# Patient Record
Sex: Female | Born: 1990
Health system: Southern US, Community
[De-identification: ages and names within clinical notes are randomized; demographics above are authoritative.]

## PROBLEM LIST (undated history)

## (undated) ENCOUNTER — Inpatient Hospital Stay (HOSPITAL_COMMUNITY): Payer: Self-pay

## (undated) DIAGNOSIS — I4581 Long QT syndrome: Secondary | ICD-10-CM

## (undated) DIAGNOSIS — Z95810 Presence of automatic (implantable) cardiac defibrillator: Secondary | ICD-10-CM

## (undated) DIAGNOSIS — G43909 Migraine, unspecified, not intractable, without status migrainosus: Secondary | ICD-10-CM

## (undated) DIAGNOSIS — R55 Syncope and collapse: Secondary | ICD-10-CM

---
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
## 2013-03-12 ENCOUNTER — Encounter: Payer: Self-pay | Admitting: Internal Medicine

## 2013-04-10 ENCOUNTER — Emergency Department (HOSPITAL_COMMUNITY)
Admission: EM | Admit: 2013-04-10 | Discharge: 2013-04-10 | Disposition: A | Discharge status: Home / Self Care | Payer: Medicaid Other | Attending: Emergency Medicine | Admitting: Emergency Medicine

## 2013-04-10 ENCOUNTER — Emergency Department (HOSPITAL_COMMUNITY): Payer: Medicaid Other

## 2013-04-10 ENCOUNTER — Encounter (HOSPITAL_COMMUNITY): Payer: Self-pay

## 2013-04-10 DIAGNOSIS — Z3490 Encounter for supervision of normal pregnancy, unspecified, unspecified trimester: Secondary | ICD-10-CM

## 2013-04-10 DIAGNOSIS — Z79899 Other long term (current) drug therapy: Secondary | ICD-10-CM | POA: Insufficient documentation

## 2013-04-10 DIAGNOSIS — O9989 Other specified diseases and conditions complicating pregnancy, childbirth and the puerperium: Secondary | ICD-10-CM | POA: Insufficient documentation

## 2013-04-10 DIAGNOSIS — N898 Other specified noninflammatory disorders of vagina: Secondary | ICD-10-CM | POA: Insufficient documentation

## 2013-04-10 DIAGNOSIS — R109 Unspecified abdominal pain: Secondary | ICD-10-CM | POA: Insufficient documentation

## 2013-04-10 HISTORY — DX: Long QT syndrome: I45.81

## 2013-04-10 LAB — COMPREHENSIVE METABOLIC PANEL
ALT: 10 U/L (ref 0–35)
AST: 16 U/L (ref 0–37)
CO2: 26 mEq/L (ref 19–32)
Chloride: 99 mEq/L (ref 96–112)
GFR calc Af Amer: >90 mL/min (ref >90)
GFR calc non Af Amer: >90 mL/min (ref >90)
Glucose, Bld: 91 mg/dL (ref 70–99)
Sodium: 135 mEq/L (ref 135–145)
Total Bilirubin: 0.2 mg/dL — ABNORMAL LOW (ref 0.3–1.2)

## 2013-04-10 LAB — CBC WITH DIFFERENTIAL/PLATELET
Basophils Absolute: 0 10*3/uL (ref 0.0–0.1)
Eosinophils Relative: 2 % (ref 0–5)
HCT: 38.6 % (ref 36.0–46.0)
Lymphocytes Relative: 42 % (ref 12–46)
Lymphs Abs: 3.1 10*3/uL (ref 0.7–4.0)
MCV: 86.2 fL (ref 78.0–100.0)
Monocytes Absolute: 0.8 10*3/uL (ref 0.1–1.0)
Neutro Abs: 3.4 10*3/uL (ref 1.7–7.7)
Platelets: 209 10*3/uL (ref 150–400)
RBC: 4.48 MIL/uL (ref 3.87–5.11)
RDW: 12.4 % (ref 11.5–15.5)
WBC: 7.5 10*3/uL (ref 4.0–10.5)

## 2013-04-10 LAB — URINALYSIS, ROUTINE W REFLEX MICROSCOPIC
Bilirubin Urine: NEGATIVE
Hgb urine dipstick: NEGATIVE
Ketones, ur: NEGATIVE mg/dL
Nitrite: NEGATIVE
Protein, ur: NEGATIVE mg/dL
Urobilinogen, UA: 0.2 mg/dL (ref 0.0–1.0)

## 2013-04-10 LAB — ABO/RH: ABO/RH(D): B POS

## 2013-04-10 LAB — WET PREP, GENITAL

## 2013-04-10 NOTE — ED Provider Notes (Signed)
CSN: [id number]     Arrival date & time 04/10/13  1636 History   First MD Initiated Contact with Patient 04/10/13 1818     Chief Complaint  Patient presents with  . Abdominal Pain   (Consider location/radiation/quality/duration/timing/severity/associated sxs/prior Treatment) The history is provided by the patient.  22 year old female G1 (LMP beginning of June, unsure of exact date) with a past medical history of long QT syndrome  that is here with crampy intermittent abdominal pain since yesterday. She points to her right lower pelvis as the location of her pain. She has periods where she is completely pain-free. She has no pain currently. At its worst, she rates it at 5/10. She has had some nausea, but has had that throughout this pregnancy. No vomiting. She was able to eat today with no problems. She denies diarrhea, fevers, dysuria, frequency, vaginal bleeding, vaginal discharge and leakage of fluid. She has not received any prenatal care. She is not taking prenatal vitamins. She denies tobacco use, drug use and alcohol use. She reports having difficulty getting her Medicaid set up properly, which is the barrier to her receiving her prenatal and primary care.  Past Medical History  Diagnosis Date  . Long Q-T syndrome    Past Surgical History  Procedure Laterality Date  . Tonsillectomy     History reviewed. No pertinent family history. History  Substance Use Topics  . Smoking status: Never Smoker   . Smokeless tobacco: Not on file  . Alcohol Use: No   OB History   Grav Para Term Preterm Abortions TAB SAB Ect Mult Living                 Review of Systems  Constitutional: Negative for fever and chills.  Respiratory: Negative for chest tightness and shortness of breath.   Gastrointestinal: Negative for vomiting, abdominal pain and diarrhea.  Genitourinary: Negative for dysuria, frequency, vaginal bleeding and vaginal discharge.  Musculoskeletal: Negative for back pain and joint  swelling.  Skin: Negative for rash.  Neurological: Negative for syncope and headaches.  All other systems reviewed and are negative.    Allergies  Review of patient's allergies indicates no known allergies.  Home Medications   Current Outpatient Rx  Name  Route  Sig  Dispense  Refill  . acetaminophen (TYLENOL) 500 MG tablet   Oral   Take 500-1,000 mg by mouth daily as needed for pain.         Marland Kitchen atenolol (TENORMIN) 25 MG tablet   Oral   Take 25 mg by mouth 2 (two) times daily.          BP 135/81  Pulse 86  Temp(Src) 98.3 F (36.8 C) (Oral)  Resp 16  SpO2 100%  LMP 01/08/2013 Physical Exam  Vitals reviewed. Constitutional: She is oriented to person, place, and time. She appears well-developed and well-nourished. No distress.  HENT:  Right Ear: External ear normal.  Left Ear: External ear normal.  Mouth/Throat: No oropharyngeal exudate.  Eyes: Conjunctivae and EOM are normal. Pupils are equal, round, and reactive to light.  Neck: Normal range of motion. Neck supple.  Cardiovascular: Normal rate, regular rhythm, normal heart sounds and intact distal pulses.  Exam reveals no gallop and no friction rub.   No murmur heard. Pulmonary/Chest: Effort normal and breath sounds normal.  Abdominal: Soft. Bowel sounds are normal. She exhibits no distension. There is no tenderness. There is no rebound and no guarding.  Genitourinary: Uterus normal. There is no rash or tenderness  on the right labia. There is no rash or tenderness on the left labia. Uterus is not tender. Cervix exhibits no motion tenderness, no discharge and no friability. Right adnexum displays no mass and no tenderness. Left adnexum displays no mass and no tenderness. No tenderness or bleeding around the vagina. Vaginal discharge (scant, white) found.  RN present  Musculoskeletal: Normal range of motion. She exhibits no edema.  Lymphadenopathy:       Right: No inguinal adenopathy present.       Left: No inguinal  adenopathy present.  Neurological: She is alert and oriented to person, place, and time. No cranial nerve deficit.  Skin: Skin is warm and dry. No rash noted.  Psychiatric: She has a normal mood and affect.    ED Course  Procedures (including critical care time) Labs Review Labs Reviewed  WET PREP, GENITAL - Abnormal; Notable for the following:    Clue Cells Wet Prep HPF POC MODERATE (*)    WBC, Wet Prep HPF POC FEW (*)    All other components within normal limits  CBC WITH DIFFERENTIAL - Abnormal; Notable for the following:    MCHC 36.8 (*)    All other components within normal limits  COMPREHENSIVE METABOLIC PANEL - Abnormal; Notable for the following:    BUN 5 (*)    Total Bilirubin 0.2 (*)    All other components within normal limits  URINALYSIS, ROUTINE W REFLEX MICROSCOPIC - Abnormal; Notable for the following:    Specific Gravity, Urine 1.003 (*)    All other components within normal limits  POCT PREGNANCY, URINE - Abnormal; Notable for the following:    Preg Test, Ur POSITIVE (*)    All other components within normal limits  GC/CHLAMYDIA PROBE AMP  ABO/RH   Imaging Review US Ob Transvaginal  04/10/2013   CLINICAL DATA:  Right-sided abdominal pain. Uncertain LMP.  EXAM: OBSTETRIC <14 WK [place] AND TRANSVAGINAL OB US  TECHNIQUE: Both transabdominal and transvaginal ultrasound examinations were performed for complete evaluation of the gestation as well as the maternal uterus, adnexal regions, and pelvic cul-de-sac. Transvaginal technique was performed to assess early pregnancy.  COMPARISON:  None.  FINDINGS: Intrauterine gestational sac: Visualized/normal in shape.  Yolk sac:  Visualized  Embryo:  Visualized  Cardiac Activity: Visualized  Heart Rate:  175 bpm  CRL:   33  mm   10 w 1 d                  [place] EDC: 11/05/2013  Maternal uterus/adnexae: No abnormality identified. Both ovaries are normal in appearance. No adnexal mass or free fluid identified.  IMPRESSION: Single living IUP  measuring 10 weeks 1 day with U/S EDC of 11/05/2013.  No significant maternal uterine or adnexal abnormality identified.   Electronically Signed   By: Myles Rosenthal   On: 04/10/2013 20:38    MDM   22 year old female G1 (LMP beginning of June, unsure of exact date) with a past medical history of long QT syndrome (on atenolol 25 mg twice a day, has not taken it since Thursday) that is here with crampy intermittent abdominal pain since yesterday.  No prior ultrasound confirmation of pregnancy location   no prenatal care. On exam she has normal vital signs and appears well. She has no abdominal tenderness.  Labs sent from triage are all reassuring. Given the intermittent nature of her pain and lack of other symptoms I seriously doubt appendicitis is at play. Also doubt ovarian torsion given the  mild nature of her pain.  Pelvic ultrasound ordered.  9:55 PM Pelvic exam unremarkable.  No CMT or adnexal tenderness.  Pelvic u/s demonstrated iup.  No signs of threatened ab.  Repeat abd exam benign. Pregnancy counseling given. It is felt the pt is stable for discharge with outpatient OB f/u.  Return precautions reviewed.  Clinical Impression: 1. Normal IUP (intrauterine pregnancy) on prenatal ultrasound   2. Abdominal pain     Disposition: Discharge  Condition: Good  I have discussed the results, Dx and Tx plan. They expressed understanding and agree with the plan and were told to return to ED with any worsening of condition or concern.    Discharge Medication List as of 04/10/2013  9:52 PM      Follow Up: [facility]     [address] [place] [address]     Followup with women's health outpatient clinic as discussed      Pt seen in conjunction with Dr. Jeraldine Loots.  Reine Just. Beverely Pace, MD Emergency Medicine PGY-III [phone]  Addendum: Flagyl 500 mg BID x 7d #14 called in to [facility] on [address].  Pt notified via her preferred phone number [phone]  [phone] cell.  Reine Just. Beverely Pace, MD Emergency Medicine PGY-III   Oleh Genin, MD 04/11/13 [phone]

## 2013-04-10 NOTE — ED Notes (Signed)
Family at bedside. 

## 2013-04-10 NOTE — ED Notes (Signed)
Pt c/o RLQ pain, possible 3 months pregnant, LNP June, unsure of the date. Pt denies N/V/D, abnormal vaginal odor, bleeding, or d/c

## 2013-04-11 LAB — GC/CHLAMYDIA PROBE AMP
CT Probe RNA: NEGATIVE
GC Probe RNA: NEGATIVE

## 2013-04-11 NOTE — ED Provider Notes (Signed)
I evaluated the patient and discussed management with Dr. Beverely Pace.  I agree with the history, physical, assessment, and plan of care.   I saw the relevant studies.  The documentation is accurate.  On my exam the patient was in no distress.  We discussed ultrasound results, and the patient was discharged in stable condition.  Gerhard Munch, MD 04/11/13 [phone]

## 2013-05-10 LAB — OB RESULTS CONSOLE HEPATITIS B SURFACE ANTIGEN: Hepatitis B Surface Ag: NEGATIVE

## 2013-05-10 LAB — OB RESULTS CONSOLE GC/CHLAMYDIA
Chlamydia: NEGATIVE
Gonorrhea: NEGATIVE

## 2013-05-10 LAB — OB RESULTS CONSOLE HIV ANTIBODY (ROUTINE TESTING): HIV: NONREACTIVE

## 2013-05-10 LAB — OB RESULTS CONSOLE ABO/RH: RH Type: POSITIVE

## 2013-05-10 LAB — OB RESULTS CONSOLE ANTIBODY SCREEN: ANTIBODY SCREEN: NEGATIVE

## 2013-05-10 LAB — OB RESULTS CONSOLE RPR: RPR: NONREACTIVE

## 2013-05-10 LAB — OB RESULTS CONSOLE RUBELLA ANTIBODY, IGM: Rubella: IMMUNE

## 2013-05-25 ENCOUNTER — Encounter: Payer: Self-pay | Admitting: *Deleted

## 2013-06-06 ENCOUNTER — Ambulatory Visit (INDEPENDENT_AMBULATORY_CARE_PROVIDER_SITE_OTHER): Payer: Medicaid Other | Admitting: Internal Medicine

## 2013-06-06 ENCOUNTER — Encounter: Payer: Self-pay | Admitting: Internal Medicine

## 2013-06-06 ENCOUNTER — Encounter (INDEPENDENT_AMBULATORY_CARE_PROVIDER_SITE_OTHER): Payer: Self-pay

## 2013-06-06 VITALS — BP 102/60 | HR 84 | Ht 64.0 in | Wt 126.4 lb

## 2013-06-06 DIAGNOSIS — I519 Heart disease, unspecified: Secondary | ICD-10-CM | POA: Insufficient documentation

## 2013-06-06 DIAGNOSIS — I2510 Atherosclerotic heart disease of native coronary artery without angina pectoris: Secondary | ICD-10-CM

## 2013-06-06 DIAGNOSIS — I4581 Long QT syndrome: Secondary | ICD-10-CM | POA: Insufficient documentation

## 2013-06-06 DIAGNOSIS — R55 Syncope and collapse: Secondary | ICD-10-CM | POA: Insufficient documentation

## 2013-06-06 MED ORDER — METOPROLOL TARTRATE 25 MG PO TABS: 12.5000 mg | ORAL_TABLET | Freq: Two times a day (BID) | ORAL | Status: DC

## 2013-06-06 NOTE — Patient Instructions (Signed)
Your physician has recommended you make the following change in your medication:  1) Start Metoprolol 12.5 mg twice daily  Your physician recommends that you schedule a follow-up appointment in: 8 weeks with Dr. Ladona Ridgel.    Increase your salt intake Increase your fluid intake Avoid caffeine

## 2013-06-06 NOTE — Assessment & Plan Note (Signed)
Her syncopal episodes are fairly typical for autonomic dysfunction. Both her mother and grandmother have had syncope related to this etiology. We discussed the importance of adequate hydration and sodium intake. We also discussed the importance of maintaining a horizontal posture when she feels the episodes occurring. My experience with pregnancy, particularly in the second and third trimester is that patients with autonomic dysfunction typically improve. Hopefully this will be the case for her and her syncopal episodes will be quiet.

## 2013-06-06 NOTE — Assessment & Plan Note (Signed)
The patient is currently not on a beta blocker. I've recommended that she start metoprolol 12.5 mg twice daily. I've uses medication multiple times in pregnancy, typically associated with SVT. The patient's family history is somewhat complex. Both her mother and grandmother have not had any malignant ventricular arrhythmia since ICD implantation. She has had cousins die suddenly however. For this reason, I have encouraged her to continue her beta blocker. Her syncopal episodes, do not appear to be related to malignant ventricular arrhythmias. I will try to obtain her cardiac monitor from her primary cardiologist.

## 2013-06-06 NOTE — Progress Notes (Signed)
      HPI Courtney Rice is referred today for evaluation of syncope in the setting of long QT syndrome, and 18 week pregnancy. The patient is a 22 year old woman with a history of syncope dating back to her time in high school. She is a strong family history of long QT syndrome with distant relatives dying suddenly. I care for her mother and grandmother, both of whom have long QT syndrome, and both of whom have ICDs implanted. Neither her mother nor her grandmother has ever had a malignant ventricular arrhythmias. R. Patient notes that she has episodes of syncope and near-syncope. She is learned that if she lays down quickly, these episodes will resolve. They're always associated with upright posture. She is now [redacted] weeks pregnant, and has had no syncope or near-syncope for the last 2 months. Previously, the patient has been treated with beta blocker therapy, but has been intolerant for the most part with these medications. In addition, she wore a cardiac monitor but we do not have the results of this. This was ordered by her pediatric cardiologist. No Known Allergies   Current Outpatient Prescriptions  Medication Sig Dispense Refill  . metoprolol tartrate (LOPRESSOR) 25 MG tablet Take 0.5 tablets (12.5 mg total) by mouth 2 (two) times daily.  30 tablet  3   No current facility-administered medications for this visit.     Past Medical History  Diagnosis Date  . Long Q-T syndrome     ROS:   All systems reviewed and negative except as noted in the HPI.   Past Surgical History  Procedure Laterality Date  . Tonsillectomy       No family history on file.   History   Social History  . Marital Status: Single    Spouse Name: N/A    Number of Children: N/A  . Years of Education: N/A   Occupational History  . Not on file.   Social History Main Topics  . Smoking status: Never Smoker   . Smokeless tobacco: Not on file  . Alcohol Use: No  . Drug Use: No  . Sexual Activity: Yes    Other Topics Concern  . Not on file   Social History Narrative  . No narrative on file     BP 102/60  Pulse 84  Ht 5\' 4"  (1.626 m)  Wt 126 lb 6.4 oz (57.335 kg)  BMI 21.69 kg/m2  Physical Exam:  Well appearing 22 year old woman, no acute distress HEENT: Unremarkable Neck:  No JVD, no thyromegally Back:  No CVA tenderness Lungs:  Clear with no wheezes, rales, or rhonchi. HEART:  Regular rate rhythm, no murmurs, no rubs, no clicks Abd:  soft, positive bowel sounds, no organomegally, no rebound, no guarding Ext:  2 plus pulses, no edema, no cyanosis, no clubbing Skin:  No rashes no nodules Neuro:  CN II through XII intact, motor grossly intact  EKG - normal sinus rhythm with prolongation of the QT interval  Assess/Plan:

## 2013-06-08 ENCOUNTER — Telehealth: Payer: Self-pay | Admitting: Internal Medicine

## 2013-06-08 ENCOUNTER — Telehealth: Payer: Self-pay | Admitting: Cardiology

## 2013-06-08 NOTE — Telephone Encounter (Signed)
Monitor Report Received gave to Scheduling Dept 06/08/13/KM

## 2013-06-08 NOTE — Telephone Encounter (Signed)
ROI faxed to [facility]/ Dr.Buck at [phone]  06/08/13/KM

## 2013-06-19 ENCOUNTER — Telehealth: Payer: Self-pay | Admitting: *Deleted

## 2013-06-19 NOTE — Telephone Encounter (Signed)
I spoke with the patient and made her aware that per Dr. Ladona Ridgel, her monitor from Dr. Ace Gins had been reviewed. No significant findings. No change in therapy at this time. I have encouraged her to follow up as scheduled with Dr. Ladona Ridgel on 08/07/13. She voices understanding.

## 2013-07-05 ENCOUNTER — Emergency Department (HOSPITAL_COMMUNITY)
Admission: EM | Admit: 2013-07-05 | Discharge: 2013-07-05 | Disposition: A | Discharge status: Home / Self Care | Payer: Medicaid Other | Source: Home / Self Care | Attending: Family Medicine | Admitting: Family Medicine

## 2013-07-05 ENCOUNTER — Encounter (HOSPITAL_COMMUNITY): Payer: Self-pay | Admitting: Emergency Medicine

## 2013-07-05 DIAGNOSIS — B9789 Other viral agents as the cause of diseases classified elsewhere: Secondary | ICD-10-CM

## 2013-07-05 DIAGNOSIS — B349 Viral infection, unspecified: Secondary | ICD-10-CM

## 2013-07-05 LAB — POCT RAPID STREP A: Streptococcus, Group A Screen (Direct): NEGATIVE

## 2013-07-05 NOTE — ED Notes (Signed)
Sore throat onset Monday, runny nose, cough, headache, general soreness, intermittent fever.

## 2013-07-05 NOTE — ED Provider Notes (Signed)
Medical screening examination/treatment/procedure(s) were performed by resident physician or non-physician practitioner and as supervising physician I was immediately available for consultation/collaboration.   Para Cossey DOUGLAS MD.   Shanise Balch D Isbella Arline, MD 07/05/13 2117 

## 2013-07-05 NOTE — ED Provider Notes (Signed)
CSN: [id number]     Arrival date & time 07/05/13  1624 History   First MD Initiated Contact with Patient 07/05/13 1740     Chief Complaint  Patient presents with  . Sore Throat   (Consider location/radiation/quality/duration/timing/severity/associated sxs/prior Treatment) Patient is a 22 y.o. female presenting with pharyngitis. The history is provided by the patient. No language interpreter was used.  Sore Throat This is a new problem. Episode onset: 4 days. The problem occurs constantly. The problem has been gradually worsening. Associated symptoms include shortness of breath. Pertinent negatives include no chest pain and no headaches. Nothing aggravates the symptoms. Nothing relieves the symptoms. She has tried nothing for the symptoms. The treatment provided no relief.  Pt complains of a sore throat and a cough  Past Medical History  Diagnosis Date  . Long Q-T syndrome    Past Surgical History  Procedure Laterality Date  . Tonsillectomy     No family history on file. History  Substance Use Topics  . Smoking status: Never Smoker   . Smokeless tobacco: Not on file  . Alcohol Use: No   OB History   Grav Para Term Preterm Abortions TAB SAB Ect Mult Living                 Review of Systems  Respiratory: Positive for shortness of breath.   Cardiovascular: Negative for chest pain.  Neurological: Negative for headaches.  All other systems reviewed and are negative.    Allergies  Review of patient's allergies indicates no known allergies.  Home Medications   Current Outpatient Rx  Name  Route  Sig  Dispense  Refill  . metoprolol tartrate (LOPRESSOR) 25 MG tablet   Oral   Take 0.5 tablets (12.5 mg total) by mouth 2 (two) times daily.   30 tablet   3    BP 119/74  Pulse 100  Temp(Src) 99.1 F (37.3 C) (Oral)  Resp 16  SpO2 100% Physical Exam  Nursing note and vitals reviewed. Constitutional: She is oriented to person, place, and time. She appears well-developed  and well-nourished.  HENT:  Head: Normocephalic.  Right Ear: External ear normal.  Left Ear: External ear normal.  Erythematous throat  Eyes: EOM are normal. Pupils are equal, round, and reactive to light.  Neck: Normal range of motion.  Cardiovascular: Normal rate and regular rhythm.   Pulmonary/Chest: Effort normal and breath sounds normal.  Abdominal: Soft. She exhibits no distension.  Musculoskeletal: Normal range of motion.  Neurological: She is alert and oriented to person, place, and time.  Psychiatric: She has a normal mood and affect.    ED Course  Procedures (including critical care time) Labs Review Labs Reviewed - No data to display Imaging Review No results found.  EKG Interpretation    Date/Time:    Ventricular Rate:    PR Interval:    QRS Duration:   QT Interval:    QTC Calculation:   R Axis:     Text Interpretation:              MDM   1. Viral illness     Tylenol   Strep negative     Elson Areas, [place] 07/05/13 1850

## 2013-07-06 ENCOUNTER — Encounter (HOSPITAL_COMMUNITY): Payer: Self-pay | Admitting: *Deleted

## 2013-07-06 ENCOUNTER — Inpatient Hospital Stay (HOSPITAL_COMMUNITY)
Admission: AD | Admit: 2013-07-06 | Discharge: 2013-07-07 | Disposition: A | Discharge status: Home / Self Care | Payer: Medicaid Other | Source: Ambulatory Visit | Attending: Obstetrics | Admitting: Obstetrics

## 2013-07-06 DIAGNOSIS — N760 Acute vaginitis: Secondary | ICD-10-CM

## 2013-07-06 DIAGNOSIS — Z202 Contact with and (suspected) exposure to infections with a predominantly sexual mode of transmission: Secondary | ICD-10-CM

## 2013-07-06 DIAGNOSIS — A499 Bacterial infection, unspecified: Secondary | ICD-10-CM | POA: Insufficient documentation

## 2013-07-06 DIAGNOSIS — B9689 Other specified bacterial agents as the cause of diseases classified elsewhere: Secondary | ICD-10-CM | POA: Insufficient documentation

## 2013-07-06 LAB — URINALYSIS, ROUTINE W REFLEX MICROSCOPIC
Bilirubin Urine: NEGATIVE
Ketones, ur: NEGATIVE mg/dL
Nitrite: NEGATIVE
Specific Gravity, Urine: <1.005 — ABNORMAL LOW (ref 1.005–1.030)
Urobilinogen, UA: 0.2 mg/dL (ref 0.0–1.0)

## 2013-07-06 MED ORDER — AZITHROMYCIN 250 MG PO TABS
1000.0000 mg | ORAL_TABLET | Freq: Once | ORAL | Status: AC
  Administered 2013-07-07: 1000 mg via ORAL
  Filled 2013-07-06: qty 4

## 2013-07-06 MED ORDER — CEFTRIAXONE SODIUM 250 MG IJ SOLR
250.0000 mg | Freq: Once | INTRAMUSCULAR | Status: AC
  Administered 2013-07-07: 250 mg via INTRAMUSCULAR
  Filled 2013-07-06: qty 250

## 2013-07-06 NOTE — MAU Note (Signed)
My boyfriend called and told me he tested positive for GC. I don't have any symptoms

## 2013-07-06 NOTE — MAU Provider Note (Signed)
History     CSN: [id number]  Arrival date and time: 07/06/13 2304   None     Chief Complaint  Patient presents with  . Exposure to STD   HPI  Ms Courtney Rice is a 22 y.o. female G1P0 at [redacted]w[redacted]d who presents for treatment of gonorrhea. She was recently informed by her boyfriend that he tested positive for gonorrhea. The patient does not complain of any symptoms; denies vaginal discharge, abdominal pain or back pain. She reports good fetal movement, denies LOF, vaginal bleeding, vaginal itching/burning, urinary symptoms, h/a, dizziness, n/v, or fever/chills.    RN note: My boyfriend called and told me he tested positive for GC. I don't have any symptoms   OB History   Grav Para Term Preterm Abortions TAB SAB Ect Mult Living   1               Past Medical History  Diagnosis Date  . Long Q-T syndrome     Past Surgical History  Procedure Laterality Date  . Tonsillectomy      No family history on file.  History  Substance Use Topics  . Smoking status: Never Smoker   . Smokeless tobacco: Not on file  . Alcohol Use: No    Allergies: No Known Allergies  Prescriptions prior to admission  Medication Sig Dispense Refill  . metoprolol tartrate (LOPRESSOR) 25 MG tablet Take 0.5 tablets (12.5 mg total) by mouth 2 (two) times daily.  30 tablet  3   Results for orders placed during the hospital encounter of 07/06/13 (from the past 24 hour(s))  URINALYSIS, ROUTINE W REFLEX MICROSCOPIC     Status: Abnormal   Collection Time    07/06/13 11:15 PM      Result Value Range   Color, Urine YELLOW  YELLOW   APPearance CLEAR  CLEAR   Specific Gravity, Urine <1.005 (*) 1.005 - 1.030   pH 5.5  5.0 - 8.0   Glucose, UA NEGATIVE  NEGATIVE mg/dL   Hgb urine dipstick NEGATIVE  NEGATIVE   Bilirubin Urine NEGATIVE  NEGATIVE   Ketones, ur NEGATIVE  NEGATIVE mg/dL   Protein, ur NEGATIVE  NEGATIVE mg/dL   Urobilinogen, UA 0.2  0.0 - 1.0 mg/dL   Nitrite NEGATIVE  NEGATIVE   Leukocytes, UA NEGATIVE  NEGATIVE  WET PREP, GENITAL     Status: Abnormal   Collection Time    07/06/13 11:55 PM      Result Value Range   Yeast Wet Prep HPF POC NONE SEEN  NONE SEEN   Trich, Wet Prep NONE SEEN  NONE SEEN   Clue Cells Wet Prep HPF POC MANY (*) NONE SEEN   WBC, Wet Prep HPF POC MODERATE (*) NONE SEEN    Review of Systems  Constitutional: Negative for fever and chills.  Gastrointestinal: Negative for nausea, vomiting, abdominal pain, diarrhea and constipation.  Genitourinary: Negative for dysuria, urgency, frequency and hematuria.       No vaginal discharge. No vaginal bleeding. No dysuria.   Musculoskeletal: Negative for back pain.   Physical Exam   Blood pressure 119/71, pulse 104, temperature 98.5 F (36.9 C), resp. rate 18, height 5\' 4"  (1.626 m), weight 61.417 kg (135 lb 6.4 oz), last menstrual period 01/08/2013.  Physical Exam  Constitutional: She is oriented to person, place, and time. She appears well-developed and well-nourished. No distress.  HENT:  Head: Normocephalic.  Eyes: Pupils are equal, round, and reactive to light.  Neck: Neck supple.  Respiratory:  Effort normal.  GI: Soft. She exhibits no distension. There is no tenderness. There is no rebound and no guarding.  Genitourinary: Vaginal discharge found.  Speculum exam: Vagina - Moderate amount of creamy discharge, mild odor Cervix - No contact bleeding GC/Chlam, wet prep done Chaperone present for exam.   Musculoskeletal: Normal range of motion.  Neurological: She is alert and oriented to person, place, and time.  Skin: Skin is warm. She is not diaphoretic.  Psychiatric: Her behavior is normal.    Fetal Tracing: Baseline: 130 bpm- difficult to trace due to gestational age Variability: moderate Accelerations:  10x10 Decelerations: variable  Toco: None   MAU Course  Procedures None  MDM Wet prep GC/Chlamydia- pending  Azithromycin 1 gram PO Rocephin 250 mg IM   Assessment  and Plan   A: 1. Exposure to gonorrhea   2. BV (bacterial vaginosis)    P: Discharge home RX: Flagyl  You have been diagnosed with a sexually transmitted disease.  You have been treated and your partner(s) will need to be treated.  No sex until 10 days after you finished your medicine and no sex until 10 days after your partner has taken their medication. Follow up with Dr. Gaynell Face, as scheduled   Iona Hansen Rasch, NP  07/06/2013, 11:18 PM

## 2013-07-07 LAB — WET PREP, GENITAL: Trich, Wet Prep: NONE SEEN

## 2013-07-07 LAB — CULTURE, GROUP A STREP

## 2013-07-07 MED ORDER — METRONIDAZOLE 500 MG PO TABS: 500.0000 mg | ORAL_TABLET | Freq: Two times a day (BID) | ORAL | Status: DC

## 2013-07-09 LAB — GC/CHLAMYDIA PROBE AMP: GC Probe RNA: NEGATIVE

## 2013-08-02 NOTE — L&D Delivery Note (Signed)
Delivery Note At 3:30 AM a viable female was delivered via  (Presentation: ;  ).  APGAR: , ; weight .   Placenta status: , .  Cord:  with the following complications: .  Cord pH: not done  Anesthesia: Epidural  Episiotomy:  Lacerations:  Suture Repair: 2.0 vicryl Est. Blood Loss (mL):   Mom to postpartum.  Baby to Couplet care / Skin to Skin.  MARSHALL,BERNARD A 10/25/2013, 3:43 AM

## 2013-08-07 ENCOUNTER — Ambulatory Visit (INDEPENDENT_AMBULATORY_CARE_PROVIDER_SITE_OTHER): Payer: Medicaid Other | Admitting: Internal Medicine

## 2013-08-07 ENCOUNTER — Encounter (INDEPENDENT_AMBULATORY_CARE_PROVIDER_SITE_OTHER): Payer: Self-pay

## 2013-08-07 ENCOUNTER — Encounter: Payer: Self-pay | Admitting: Internal Medicine

## 2013-08-07 VITALS — BP 100/58 | HR 100 | Ht 64.0 in | Wt 137.8 lb

## 2013-08-07 DIAGNOSIS — R55 Syncope and collapse: Secondary | ICD-10-CM

## 2013-08-07 NOTE — Assessment & Plan Note (Signed)
She has had no recurrent syncope but does have dizziness which is quite prolonged and associated with upright posture. I strongly suspect that she has autonomic dysfunction, and that her dizziness is not related to malignant ventricular arrhythmias as it is lasting for up to 30 minutes at a time. I've encouraged the patient to increase her salt and fluid intake. When she is dizzy and standing, she is instructed to lie down.

## 2013-08-07 NOTE — Assessment & Plan Note (Signed)
She has tolerated low-dose beta blocker nicely. After her child has been born, we will consider switching from metoprolol to nadolol.

## 2013-08-07 NOTE — Patient Instructions (Signed)
Your physician recommends that you schedule a follow-up appointment in: 3-4 months with Dr Court Joyaylor   You have been referred to  1) [place] PCP--

## 2013-08-07 NOTE — Progress Notes (Signed)
      HPI Ms. Czaja returns today for followup. She is a very pleasant 23 year old woman with a family history of long QT syndrome, who is now 7 months pregnant. She has had palpitations and near-syncope in the past. She wore a cardiac monitor which demonstrated no arrhythmias. She has not had any problem with her pregnancy. She describes several episodes where she stands up and will feel very dizzy like she is going to pass out. On sitting her symptoms improved. She sometimes still feels lightheaded for up to 30 minutes at a time. She has not had frank syncope. She denies chest pain or shortness of breath. She has had problems with headaches. She has had some mild nausea. No Known Allergies   Current Outpatient Prescriptions  Medication Sig Dispense Refill  . metoprolol tartrate (LOPRESSOR) 25 MG tablet Take 0.5 tablets (12.5 mg total) by mouth 2 (two) times daily.  30 tablet  3   No current facility-administered medications for this visit.     Past Medical History  Diagnosis Date  . Long Q-T syndrome     ROS:   All systems reviewed and negative except as noted in the HPI.   Past Surgical History  Procedure Laterality Date  . Tonsillectomy       No family history on file.   History   Social History  . Marital Status: Single    Spouse Name: N/A    Number of Children: N/A  . Years of Education: N/A   Occupational History  . Not on file.   Social History Main Topics  . Smoking status: Never Smoker   . Smokeless tobacco: Not on file  . Alcohol Use: No  . Drug Use: No  . Sexual Activity: Yes   Other Topics Concern  . Not on file   Social History Narrative  . No narrative on file     BP 100/58  Pulse 100  Ht 5\' 4"  (1.626 m)  Wt 137 lb 12.8 oz (62.506 kg)  BMI 23.64 kg/m2  LMP 01/08/2013  Physical Exam:  Well appearing pregnant, young woman,NAD HEENT: Unremarkable Neck:  No JVD, no thyromegally Back:  No CVA tenderness Lungs:  Clear with no  wheezes, rales, or rhonchi. HEART:  Regular rate rhythm, no murmurs, no rubs, no clicks Abd:  Gravid, soft, positive bowel sounds, no organomegally, no rebound, no guarding Ext:  2 plus pulses, no edema, no cyanosis, no clubbing Skin:  No rashes no nodules Neuro:  CN II through XII intact, motor grossly intact   Assess/Plan:

## 2013-09-13 ENCOUNTER — Other Ambulatory Visit (HOSPITAL_COMMUNITY): Payer: Self-pay | Admitting: Obstetrics

## 2013-09-13 DIAGNOSIS — Z364 Encounter for antenatal screening for fetal growth retardation: Secondary | ICD-10-CM

## 2013-09-14 ENCOUNTER — Ambulatory Visit (HOSPITAL_COMMUNITY)
Admission: RE | Admit: 2013-09-14 | Discharge: 2013-09-14 | Disposition: A | Discharge status: Home / Self Care | Payer: Medicaid Other | Source: Ambulatory Visit | Attending: Obstetrics | Admitting: Obstetrics

## 2013-09-14 ENCOUNTER — Other Ambulatory Visit (HOSPITAL_COMMUNITY): Payer: Self-pay | Admitting: Obstetrics

## 2013-09-14 ENCOUNTER — Encounter (HOSPITAL_COMMUNITY): Payer: Self-pay | Admitting: *Deleted

## 2013-09-14 ENCOUNTER — Inpatient Hospital Stay (HOSPITAL_COMMUNITY)
Admission: AD | Admit: 2013-09-14 | Discharge: 2013-09-14 | Disposition: A | Discharge status: Home / Self Care | Payer: Medicaid Other | Source: Ambulatory Visit | Attending: Obstetrics | Admitting: Obstetrics

## 2013-09-14 DIAGNOSIS — Z364 Encounter for antenatal screening for fetal growth retardation: Secondary | ICD-10-CM

## 2013-09-14 DIAGNOSIS — O26849 Uterine size-date discrepancy, unspecified trimester: Secondary | ICD-10-CM | POA: Insufficient documentation

## 2013-09-14 DIAGNOSIS — Z3689 Encounter for other specified antenatal screening: Secondary | ICD-10-CM

## 2013-09-14 DIAGNOSIS — O4700 False labor before 37 completed weeks of gestation, unspecified trimester: Secondary | ICD-10-CM | POA: Insufficient documentation

## 2013-09-14 DIAGNOSIS — O368390 Maternal care for abnormalities of the fetal heart rate or rhythm, unspecified trimester, not applicable or unspecified: Secondary | ICD-10-CM | POA: Insufficient documentation

## 2013-09-14 NOTE — MAU Provider Note (Signed)
Pt sent from MFM for NST. Pt had 6/8 BPP for no breathing. Reactive NST 8/10 FHT: 120s mod variability, Mult accels >15x15. No decel Toco: uterine irritability.   Discussed with Dr. Gaynell FaceMarshall. Will discharge home to follow up with clinic for coordinating echo.  Tawana ScaleMichael Ryan Merl Bommarito, MD OB Fellow

## 2013-09-14 NOTE — MAU Note (Signed)
Patient states she had an ultrasound today and the BPP was 6/8 and was sent to MAU for further monitoring. Patient reports good fetal movement, no bleeding, leaking or pain.

## 2013-09-18 ENCOUNTER — Other Ambulatory Visit (HOSPITAL_COMMUNITY): Payer: Self-pay | Admitting: Obstetrics and Gynecology

## 2013-09-18 DIAGNOSIS — Z8249 Family history of ischemic heart disease and other diseases of the circulatory system: Secondary | ICD-10-CM

## 2013-09-18 DIAGNOSIS — O35BXX0 Maternal care for other (suspected) fetal abnormality and damage, fetal cardiac anomalies, not applicable or unspecified: Secondary | ICD-10-CM

## 2013-09-18 DIAGNOSIS — O358XX0 Maternal care for other (suspected) fetal abnormality and damage, not applicable or unspecified: Secondary | ICD-10-CM

## 2013-09-20 ENCOUNTER — Other Ambulatory Visit (HOSPITAL_COMMUNITY): Payer: Self-pay | Admitting: Obstetrics and Gynecology

## 2013-09-20 ENCOUNTER — Ambulatory Visit (HOSPITAL_COMMUNITY)
Admission: RE | Admit: 2013-09-20 | Discharge: 2013-09-20 | Disposition: A | Discharge status: Home / Self Care | Payer: Medicaid Other | Source: Ambulatory Visit | Attending: Obstetrics | Admitting: Obstetrics

## 2013-09-20 DIAGNOSIS — O365990 Maternal care for other known or suspected poor fetal growth, unspecified trimester, not applicable or unspecified: Secondary | ICD-10-CM | POA: Insufficient documentation

## 2013-09-20 DIAGNOSIS — O35BXX0 Maternal care for other (suspected) fetal abnormality and damage, fetal cardiac anomalies, not applicable or unspecified: Secondary | ICD-10-CM

## 2013-09-20 DIAGNOSIS — O358XX0 Maternal care for other (suspected) fetal abnormality and damage, not applicable or unspecified: Secondary | ICD-10-CM

## 2013-09-20 DIAGNOSIS — Z8249 Family history of ischemic heart disease and other diseases of the circulatory system: Secondary | ICD-10-CM

## 2013-09-21 ENCOUNTER — Other Ambulatory Visit: Payer: Self-pay

## 2013-09-21 DIAGNOSIS — O35BXX0 Maternal care for other (suspected) fetal abnormality and damage, fetal cardiac anomalies, not applicable or unspecified: Secondary | ICD-10-CM | POA: Insufficient documentation

## 2013-09-27 ENCOUNTER — Ambulatory Visit (HOSPITAL_COMMUNITY): Payer: Medicaid Other | Attending: Obstetrics

## 2013-10-03 ENCOUNTER — Ambulatory Visit (HOSPITAL_COMMUNITY)
Admission: RE | Admit: 2013-10-03 | Discharge: 2013-10-03 | Disposition: A | Discharge status: Home / Self Care | Payer: Medicaid Other | Source: Ambulatory Visit | Attending: Obstetrics and Gynecology | Admitting: Obstetrics and Gynecology

## 2013-10-03 DIAGNOSIS — O365990 Maternal care for other known or suspected poor fetal growth, unspecified trimester, not applicable or unspecified: Secondary | ICD-10-CM | POA: Insufficient documentation

## 2013-10-03 DIAGNOSIS — Z8249 Family history of ischemic heart disease and other diseases of the circulatory system: Secondary | ICD-10-CM

## 2013-10-03 DIAGNOSIS — O35BXX0 Maternal care for other (suspected) fetal abnormality and damage, fetal cardiac anomalies, not applicable or unspecified: Secondary | ICD-10-CM

## 2013-10-03 DIAGNOSIS — O358XX0 Maternal care for other (suspected) fetal abnormality and damage, not applicable or unspecified: Secondary | ICD-10-CM

## 2013-10-08 DIAGNOSIS — O365990 Maternal care for other known or suspected poor fetal growth, unspecified trimester, not applicable or unspecified: Secondary | ICD-10-CM | POA: Insufficient documentation

## 2013-10-09 ENCOUNTER — Other Ambulatory Visit (HOSPITAL_COMMUNITY): Payer: Self-pay | Admitting: Maternal and Fetal Medicine

## 2013-10-09 ENCOUNTER — Encounter: Payer: Self-pay | Admitting: Internal Medicine

## 2013-10-09 ENCOUNTER — Ambulatory Visit (INDEPENDENT_AMBULATORY_CARE_PROVIDER_SITE_OTHER): Payer: Medicaid Other | Admitting: Internal Medicine

## 2013-10-09 VITALS — BP 119/74 | HR 90 | Ht 68.0 in | Wt 149.6 lb

## 2013-10-09 DIAGNOSIS — I4581 Long QT syndrome: Secondary | ICD-10-CM

## 2013-10-09 DIAGNOSIS — R079 Chest pain, unspecified: Secondary | ICD-10-CM | POA: Insufficient documentation

## 2013-10-09 NOTE — Progress Notes (Signed)
      HPI Ms. Monjaraz returns today for followup. She is a very pleasant 23 year old woman with a family history of long QT syndrome, who is now 8 months pregnant. She has had palpitations and near-syncope in the past. She wore a cardiac monitor which demonstrated no arrhythmias. She has not had any problem with her pregnancy until now. She continues to have orthostatic symptoms. She has not had frank syncope. She denies chest pain or shortness of breath. She has had problems with headaches. She has had some mild nausea. She describes an episode of chest pain which lasted 3 hours, was associated with radiation to the arm, and sob. She has had trouble sleeping at night and experienced the need to urinate in bed at night. No Known Allergies   Current Outpatient Prescriptions  Medication Sig Dispense Refill  . acetaminophen (TYLENOL) 500 MG tablet Take 1,000 mg by mouth every 6 (six) hours as needed for headache.      . butalbital-acetaminophen-caffeine (FIORICET, ESGIC) 50-325-40 MG per tablet Take 1 tablet by mouth 2 (two) times daily as needed for headache.      . metoprolol tartrate (LOPRESSOR) 25 MG tablet Take 0.5 tablets (12.5 mg total) by mouth 2 (two) times daily.  30 tablet  3  . promethazine (PHENERGAN) 25 MG tablet Take 25 mg by mouth as needed for nausea or vomiting.       No current facility-administered medications for this visit.     Past Medical History  Diagnosis Date  . Long Q-T syndrome     ROS:   All systems reviewed and negative except as noted in the HPI.   Past Surgical History  Procedure Laterality Date  . Tonsillectomy       Family History  Problem Relation Age of Onset  . Heart disease Mother   . Diabetes Mother   . Asthma Mother   . COPD Mother   . Lupus Mother   . Stroke Mother   . COPD Maternal Grandmother   . Heart disease Maternal Grandmother   . Hearing loss Neg Hx      History   Social History  . Marital Status: Single    Spouse  Name: N/A    Number of Children: N/A  . Years of Education: N/A   Occupational History  . Not on file.   Social History Main Topics  . Smoking status: Never Smoker   . Smokeless tobacco: Never Used  . Alcohol Use: No  . Drug Use: No  . Sexual Activity: Yes   Other Topics Concern  . Not on file   Social History Narrative  . No narrative on file     BP 119/74  Pulse 90  Ht 5\' 8"  (1.727 m)  Wt 149 lb 9.6 oz (67.858 kg)  BMI 22.75 kg/m2  LMP 01/08/2013  Physical Exam:  Well appearing 3rd trimester pregnant, young woman,NAD HEENT: Unremarkable Neck:  No JVD, no thyromegally Back:  No CVA tenderness Lungs:  Clear with no wheezes, rales, or rhonchi. HEART:  Regular rate rhythm, no murmurs, no rubs, no clicks Abd:  Gravid, soft, positive bowel sounds, no organomegally, no rebound, no guarding Ext:  2 plus pulses, no edema, no cyanosis, no clubbing Skin:  No rashes no nodules Neuro:  CN II through XII intact, motor grossly intact  ECG - nsr with prolongation of the QT interval. Assess/Plan:

## 2013-10-09 NOTE — Assessment & Plan Note (Signed)
Her single episode is non-cardiac in nature. I suspect esophageal spasm. Will follow.

## 2013-10-09 NOTE — Patient Instructions (Addendum)
Your physician recommends that you schedule a follow-up appointment in: 3 months with Dr Taylor  

## 2013-10-09 NOTE — Assessment & Plan Note (Signed)
She will continue her low dose metoprolol. I have asked that she stop her metoprolol approximately 2 days prior to delivery. She is not planning on breast feeding so I would plan to start another beta blocker, (maybe Nadolol) after delivery. She is reported to have LongQT2 previously diagnosed by gene testing by Dr. Ace GinsBuck but I have not confirmed this.

## 2013-10-10 ENCOUNTER — Ambulatory Visit (HOSPITAL_COMMUNITY): Payer: Medicaid Other

## 2013-10-12 ENCOUNTER — Encounter (HOSPITAL_COMMUNITY): Payer: Self-pay

## 2013-10-12 ENCOUNTER — Ambulatory Visit (HOSPITAL_COMMUNITY)
Admission: RE | Admit: 2013-10-12 | Discharge: 2013-10-12 | Disposition: A | Discharge status: Home / Self Care | Payer: Medicaid Other | Source: Ambulatory Visit | Attending: Maternal and Fetal Medicine | Admitting: Maternal and Fetal Medicine

## 2013-10-12 ENCOUNTER — Other Ambulatory Visit (HOSPITAL_COMMUNITY): Payer: Self-pay | Admitting: Maternal and Fetal Medicine

## 2013-10-12 ENCOUNTER — Other Ambulatory Visit (HOSPITAL_COMMUNITY): Payer: Self-pay | Admitting: Obstetrics

## 2013-10-12 DIAGNOSIS — I4581 Long QT syndrome: Secondary | ICD-10-CM

## 2013-10-12 DIAGNOSIS — O99891 Other specified diseases and conditions complicating pregnancy: Secondary | ICD-10-CM | POA: Insufficient documentation

## 2013-10-12 DIAGNOSIS — O365990 Maternal care for other known or suspected poor fetal growth, unspecified trimester, not applicable or unspecified: Secondary | ICD-10-CM | POA: Insufficient documentation

## 2013-10-12 DIAGNOSIS — O9989 Other specified diseases and conditions complicating pregnancy, childbirth and the puerperium: Secondary | ICD-10-CM

## 2013-10-12 DIAGNOSIS — O358XX0 Maternal care for other (suspected) fetal abnormality and damage, not applicable or unspecified: Secondary | ICD-10-CM | POA: Insufficient documentation

## 2013-10-19 ENCOUNTER — Ambulatory Visit (HOSPITAL_COMMUNITY)
Admission: RE | Admit: 2013-10-19 | Discharge: 2013-10-19 | Disposition: A | Discharge status: Home / Self Care | Payer: Medicaid Other | Source: Ambulatory Visit | Attending: Obstetrics | Admitting: Obstetrics

## 2013-10-19 DIAGNOSIS — O358XX0 Maternal care for other (suspected) fetal abnormality and damage, not applicable or unspecified: Secondary | ICD-10-CM | POA: Insufficient documentation

## 2013-10-19 DIAGNOSIS — I4581 Long QT syndrome: Secondary | ICD-10-CM

## 2013-10-19 DIAGNOSIS — O365990 Maternal care for other known or suspected poor fetal growth, unspecified trimester, not applicable or unspecified: Secondary | ICD-10-CM | POA: Insufficient documentation

## 2013-10-24 ENCOUNTER — Encounter (HOSPITAL_COMMUNITY): Payer: Medicaid Other | Admitting: Anesthesiology

## 2013-10-24 ENCOUNTER — Inpatient Hospital Stay (HOSPITAL_COMMUNITY)
Admission: AD | Admit: 2013-10-24 | Discharge: 2013-10-27 | DRG: 775 | Disposition: A | Discharge status: Home / Self Care | Payer: Medicaid Other | Source: Ambulatory Visit | Attending: Obstetrics | Admitting: Obstetrics

## 2013-10-24 ENCOUNTER — Inpatient Hospital Stay (HOSPITAL_COMMUNITY): Payer: Medicaid Other | Admitting: Anesthesiology

## 2013-10-24 ENCOUNTER — Encounter (HOSPITAL_COMMUNITY): Payer: Self-pay | Admitting: *Deleted

## 2013-10-24 DIAGNOSIS — O99892 Other specified diseases and conditions complicating childbirth: Principal | ICD-10-CM | POA: Diagnosis present

## 2013-10-24 DIAGNOSIS — I4581 Long QT syndrome: Secondary | ICD-10-CM | POA: Diagnosis present

## 2013-10-24 DIAGNOSIS — O9989 Other specified diseases and conditions complicating pregnancy, childbirth and the puerperium: Principal | ICD-10-CM

## 2013-10-24 DIAGNOSIS — O358XX0 Maternal care for other (suspected) fetal abnormality and damage, not applicable or unspecified: Secondary | ICD-10-CM | POA: Diagnosis present

## 2013-10-24 DIAGNOSIS — IMO0001 Reserved for inherently not codable concepts without codable children: Secondary | ICD-10-CM

## 2013-10-24 LAB — CBC
HCT: 37.6 % (ref 36.0–46.0)
Hemoglobin: 12.9 g/dL (ref 12.0–15.0)
MCH: 28.7 pg (ref 26.0–34.0)
MCHC: 34.3 g/dL (ref 30.0–36.0)
MCV: 83.7 fL (ref 78.0–100.0)
Platelets: 152 10*3/uL (ref 150–400)
RBC: 4.49 MIL/uL (ref 3.87–5.11)
RDW: 13.7 % (ref 11.5–15.5)
WBC: 10.9 10*3/uL — ABNORMAL HIGH (ref 4.0–10.5)

## 2013-10-24 MED ORDER — BUTORPHANOL TARTRATE 1 MG/ML IJ SOLN: 1.0000 mg | INTRAMUSCULAR | Status: DC | PRN

## 2013-10-24 MED ORDER — FENTANYL 2.5 MCG/ML BUPIVACAINE 1/10 % EPIDURAL INFUSION (WH - ANES)
14.0000 mL/h | INTRAMUSCULAR | Status: DC | PRN
  Filled 2013-10-24: qty 125

## 2013-10-24 MED ORDER — OXYTOCIN 40 UNITS IN LACTATED RINGERS INFUSION - SIMPLE MED
62.5000 mL/h | INTRAVENOUS | Status: DC
  Filled 2013-10-24: qty 1000

## 2013-10-24 MED ORDER — MAGNESIUM SULFATE 4000MG/100ML IJ SOLN: 4.0000 g | Freq: Once | INTRAMUSCULAR | Status: DC

## 2013-10-24 MED ORDER — MAGNESIUM SULFATE BOLUS VIA INFUSION
4.0000 g | Freq: Once | INTRAVENOUS | Status: DC
  Filled 2013-10-24: qty 500

## 2013-10-24 MED ORDER — IBUPROFEN 600 MG PO TABS
600.0000 mg | ORAL_TABLET | Freq: Four times a day (QID) | ORAL | Status: DC | PRN
  Administered 2013-10-25 – 2013-10-27 (×2): 600 mg via ORAL
  Filled 2013-10-24: qty 1

## 2013-10-24 MED ORDER — ONDANSETRON HCL 4 MG/2ML IJ SOLN: 4.0000 mg | Freq: Four times a day (QID) | INTRAMUSCULAR | Status: DC | PRN

## 2013-10-24 MED ORDER — OXYTOCIN BOLUS FROM INFUSION
500.0000 mL | INTRAVENOUS | Status: DC
  Administered 2013-10-25: 500 mL via INTRAVENOUS

## 2013-10-24 MED ORDER — CITRIC ACID-SODIUM CITRATE 334-500 MG/5ML PO SOLN
30.0000 mL | ORAL | Status: DC | PRN
  Administered 2013-10-24: 30 mL via ORAL
  Filled 2013-10-24: qty 15

## 2013-10-24 MED ORDER — OXYCODONE-ACETAMINOPHEN 5-325 MG PO TABS
1.0000 | ORAL_TABLET | ORAL | Status: DC | PRN
  Administered 2013-10-26 (×2): 1 via ORAL
  Filled 2013-10-24 (×2): qty 1

## 2013-10-24 MED ORDER — LACTATED RINGERS IV SOLN
INTRAVENOUS | Status: DC
Start: 2013-10-24 — End: 2013-10-25
  Administered 2013-10-24 (×2): via INTRAVENOUS

## 2013-10-24 MED ORDER — LACTATED RINGERS IV SOLN: 500.0000 mL | Freq: Once | INTRAVENOUS | Status: DC

## 2013-10-24 MED ORDER — MAGNESIUM SULFATE 4000MG/100ML IJ SOLN
4.0000 g | Freq: Once | INTRAMUSCULAR | Status: AC | PRN
  Filled 2013-10-24 (×2): qty 100

## 2013-10-24 MED ORDER — PENICILLIN G POTASSIUM 5000000 UNITS IJ SOLR
2.5000 10*6.[IU] | INTRAVENOUS | Status: DC
  Administered 2013-10-25: 2.5 10*6.[IU] via INTRAVENOUS
  Filled 2013-10-24 (×5): qty 2.5

## 2013-10-24 MED ORDER — LIDOCAINE HCL (PF) 1 % IJ SOLN
INTRAMUSCULAR | Status: DC | PRN
  Administered 2013-10-24 (×2): 5 mL
  Administered 2013-10-24: 3 mL

## 2013-10-24 MED ORDER — PHENYLEPHRINE 40 MCG/ML (10ML) SYRINGE FOR IV PUSH (FOR BLOOD PRESSURE SUPPORT)
80.0000 ug | PREFILLED_SYRINGE | INTRAVENOUS | Status: DC | PRN
  Filled 2013-10-24: qty 2
  Filled 2013-10-24: qty 10

## 2013-10-24 MED ORDER — LACTATED RINGERS IV SOLN: 500.0000 mL | INTRAVENOUS | Status: DC | PRN

## 2013-10-24 MED ORDER — FENTANYL 2.5 MCG/ML BUPIVACAINE 1/10 % EPIDURAL INFUSION (WH - ANES)
INTRAMUSCULAR | Status: DC | PRN
  Administered 2013-10-24: 14 mL/h via EPIDURAL

## 2013-10-24 MED ORDER — PHENYLEPHRINE 40 MCG/ML (10ML) SYRINGE FOR IV PUSH (FOR BLOOD PRESSURE SUPPORT)
80.0000 ug | PREFILLED_SYRINGE | INTRAVENOUS | Status: DC | PRN
  Filled 2013-10-24: qty 2

## 2013-10-24 MED ORDER — ACETAMINOPHEN 325 MG PO TABS: 650.0000 mg | ORAL_TABLET | ORAL | Status: DC | PRN

## 2013-10-24 MED ORDER — DIPHENHYDRAMINE HCL 50 MG/ML IJ SOLN: 12.5000 mg | INTRAMUSCULAR | Status: DC | PRN

## 2013-10-24 MED ORDER — PENICILLIN G POTASSIUM 5000000 UNITS IJ SOLR
5.0000 10*6.[IU] | Freq: Once | INTRAVENOUS | Status: AC
  Administered 2013-10-24: 5 10*6.[IU] via INTRAVENOUS
  Filled 2013-10-24: qty 5

## 2013-10-24 MED ORDER — EPHEDRINE 5 MG/ML INJ
10.0000 mg | INTRAVENOUS | Status: DC | PRN
  Filled 2013-10-24: qty 2

## 2013-10-24 MED ORDER — EPHEDRINE 5 MG/ML INJ
10.0000 mg | INTRAVENOUS | Status: DC | PRN
  Filled 2013-10-24: qty 2
  Filled 2013-10-24: qty 4

## 2013-10-24 MED ORDER — LIDOCAINE HCL (PF) 1 % IJ SOLN
30.0000 mL | INTRAMUSCULAR | Status: AC | PRN
  Administered 2013-10-25: 30 mL via SUBCUTANEOUS
  Filled 2013-10-24: qty 30

## 2013-10-24 NOTE — Progress Notes (Signed)
Dr. Willa Fraterarrigan gave orders for cardiac monitoring  And Magnesium to be available for emergency.

## 2013-10-24 NOTE — Progress Notes (Signed)
RN notified Dr. Francine Gravenimaguila of  Admission and recommendations from maternal fetal medicine. MD aware. RN to call for delivery

## 2013-10-24 NOTE — Progress Notes (Signed)
Dr. Francine Gravenimaguila given report of patient in labor with questionable fetal heart problem. Report of fetal echo read over the phone.

## 2013-10-24 NOTE — Progress Notes (Signed)
Notified Dr. Gaynell FaceMarshall of pt status- recommendations for delivery here. Call MD when pt 8cm

## 2013-10-24 NOTE — MAU Note (Signed)
uc's since this a.m., denies bleeding or LOF.  Reports normal FM.

## 2013-10-24 NOTE — MAU Note (Signed)
Pt to be induced on Monday in W/S because of fetal cardiac defect.

## 2013-10-24 NOTE — Progress Notes (Signed)
Pt feeling some pain in chest- RN Assessed BP, R, and O2 sat- WNL- Pt having heart burn- feels its related to this- requesting meds for heart burn

## 2013-10-24 NOTE — Progress Notes (Signed)
Applied 5 Lead ECG per MD request. Plan to monitor remotely.

## 2013-10-24 NOTE — Anesthesia Preprocedure Evaluation (Signed)
Anesthesia Evaluation  Patient identified by MRN, date of birth, ID band Patient awake    Reviewed: Allergy & Precautions, H&P , NPO status , Patient's Chart, lab work & pertinent test results  Airway Mallampati: II TM Distance: >3 FB Neck ROM: Full    Dental no notable dental hx.    Pulmonary neg pulmonary ROS,  breath sounds clear to auscultation  Pulmonary exam normal       Cardiovascular Rhythm:Regular Rate:Normal  Long QT syndrome   Neuro/Psych negative neurological ROS  negative psych ROS   GI/Hepatic negative GI ROS, Neg liver ROS,   Endo/Other  negative endocrine ROS  Renal/GU negative Renal ROS  negative genitourinary   Musculoskeletal negative musculoskeletal ROS (+)   Abdominal   Peds negative pediatric ROS (+)  Hematology negative hematology ROS (+)   Anesthesia Other Findings   Reproductive/Obstetrics (+) Pregnancy                           Anesthesia Physical Anesthesia Plan  ASA: III  Anesthesia Plan: Epidural   Post-op Pain Management:    Induction:   Airway Management Planned:   Additional Equipment:   Intra-op Plan:   Post-operative Plan:   Informed Consent: I have reviewed the patients History and Physical, chart, labs and discussed the procedure including the risks, benefits and alternatives for the proposed anesthesia with the patient or authorized representative who has indicated his/her understanding and acceptance.   Dental advisory given  Plan Discussed with: CRNA  Anesthesia Plan Comments: (Long QT syndrome. Needs EKG monitoring and magnesium at bedside plus rapid defibrillation if required )        Anesthesia Quick Evaluation

## 2013-10-24 NOTE — Anesthesia Procedure Notes (Signed)
Epidural Patient location during procedure: OB  Staffing Anesthesiologist: Phillips GroutARIGNAN, Aryn Kops Performed by: anesthesiologist   Preanesthetic Checklist Completed: patient identified, site marked, surgical consent, pre-op evaluation, timeout performed, IV checked, risks and benefits discussed and monitors and equipment checked  Epidural Patient position: sitting Prep: ChloraPrep Patient monitoring: heart rate, continuous pulse ox and blood pressure Approach: midline Location: L4-L5 Injection technique: LOR saline  Needle:  Needle type: Tuohy  Needle gauge: 17 G Needle length: 9 cm and 9 Needle insertion depth: 5 cm Catheter type: closed end flexible Catheter size: 20 Guage Catheter at skin depth: 9 cm Test dose: negative  Assessment Events: blood not aspirated, injection not painful, no injection resistance, negative IV test and no paresthesia  Additional Notes   Patient tolerated the insertion well without complications.

## 2013-10-25 ENCOUNTER — Encounter (HOSPITAL_COMMUNITY): Payer: Self-pay | Admitting: Family Medicine

## 2013-10-25 LAB — RPR: RPR Ser Ql: NONREACTIVE

## 2013-10-25 MED ORDER — BENZOCAINE-MENTHOL 20-0.5 % EX AERO: 1.0000 "application " | INHALATION_SPRAY | CUTANEOUS | Status: DC | PRN

## 2013-10-25 MED ORDER — ONDANSETRON HCL 4 MG/2ML IJ SOLN: 4.0000 mg | INTRAMUSCULAR | Status: DC | PRN

## 2013-10-25 MED ORDER — IBUPROFEN 600 MG PO TABS
600.0000 mg | ORAL_TABLET | Freq: Four times a day (QID) | ORAL | Status: DC
Start: 2013-10-25 — End: 2013-10-27
  Administered 2013-10-25 – 2013-10-27 (×8): 600 mg via ORAL
  Filled 2013-10-25 (×9): qty 1

## 2013-10-25 MED ORDER — OXYCODONE-ACETAMINOPHEN 5-325 MG PO TABS
1.0000 | ORAL_TABLET | ORAL | Status: DC | PRN
  Administered 2013-10-25 – 2013-10-27 (×4): 1 via ORAL
  Filled 2013-10-25 (×4): qty 1

## 2013-10-25 MED ORDER — LANOLIN HYDROUS EX OINT: TOPICAL_OINTMENT | CUTANEOUS | Status: DC | PRN

## 2013-10-25 MED ORDER — WITCH HAZEL-GLYCERIN EX PADS: 1.0000 "application " | MEDICATED_PAD | CUTANEOUS | Status: DC | PRN

## 2013-10-25 MED ORDER — DIPHENHYDRAMINE HCL 25 MG PO CAPS: 25.0000 mg | ORAL_CAPSULE | Freq: Four times a day (QID) | ORAL | Status: DC | PRN

## 2013-10-25 MED ORDER — SENNOSIDES-DOCUSATE SODIUM 8.6-50 MG PO TABS
2.0000 | ORAL_TABLET | ORAL | Status: DC
  Administered 2013-10-25 – 2013-10-27 (×2): 2 via ORAL
  Filled 2013-10-25 (×2): qty 2

## 2013-10-25 MED ORDER — SIMETHICONE 80 MG PO CHEW: 80.0000 mg | CHEWABLE_TABLET | ORAL | Status: DC | PRN

## 2013-10-25 MED ORDER — PRENATAL MULTIVITAMIN CH
1.0000 | ORAL_TABLET | Freq: Every day | ORAL | Status: DC
  Administered 2013-10-25 – 2013-10-26 (×2): 1 via ORAL
  Filled 2013-10-25 (×2): qty 1

## 2013-10-25 MED ORDER — DIBUCAINE 1 % RE OINT: 1.0000 "application " | TOPICAL_OINTMENT | RECTAL | Status: DC | PRN

## 2013-10-25 MED ORDER — ONDANSETRON HCL 4 MG PO TABS: 4.0000 mg | ORAL_TABLET | ORAL | Status: DC | PRN

## 2013-10-25 MED ORDER — FERROUS SULFATE 325 (65 FE) MG PO TABS
325.0000 mg | ORAL_TABLET | Freq: Two times a day (BID) | ORAL | Status: DC
  Administered 2013-10-25 – 2013-10-27 (×4): 325 mg via ORAL
  Filled 2013-10-25 (×4): qty 1

## 2013-10-25 MED ORDER — ZOLPIDEM TARTRATE 5 MG PO TABS: 5.0000 mg | ORAL_TABLET | Freq: Every evening | ORAL | Status: DC | PRN

## 2013-10-25 MED ORDER — TETANUS-DIPHTH-ACELL PERTUSSIS 5-2.5-18.5 LF-MCG/0.5 IM SUSP
0.5000 mL | Freq: Once | INTRAMUSCULAR | Status: AC
Start: 2013-10-26 — End: 2013-10-26
  Administered 2013-10-26: 0.5 mL via INTRAMUSCULAR
  Filled 2013-10-25: qty 0.5

## 2013-10-25 NOTE — Anesthesia Postprocedure Evaluation (Signed)
  Anesthesia Post-op Note  Patient: Courtney Rice  Procedure(s) Performed: * No procedures listed *  Patient Location: Mother/Baby  Anesthesia Type:Epidural  Level of Consciousness: awake, alert  and oriented  Airway and Oxygen Therapy: Patient Spontanous Breathing  Post-op Pain: none  Post-op Assessment: Post-op Vital signs reviewed, Patient's Cardiovascular Status Stable, Respiratory Function Stable, No headache, No backache, No residual numbness and No residual motor weakness  Post-op Vital Signs: Reviewed and stable  Complications: No apparent anesthesia complications

## 2013-10-25 NOTE — H&P (Signed)
This is Dr. Francoise CeoBernard Marshall dictating the history and physical on  Courtney Rice she's a 23 year old gravida 1 at 7038 weeks [facility] GBS unknown the patient has a prolonged QT syndrome and was seen by the cardiologist and also MFM she was treated with P metropolol  and MFM did an echocardiogram on her fetus and there was a question of there being some abnormality to the heart and it was [facility] and arrangements were made for the patient to have her prenatal [facility] the [facility] she has not been to the clinic and prefer to stay here she was [facility] the patient came today saying she was contracting all day and she was 5 cm 100% in labor and I felt it was not safe to [facility] she is now 9 cm 100% plus one station she has not taken her metoprolol in 3 days she states the cardiologist told her not to take it for the first 3 days before delivery I do not know how the patient knew she was going into labor today neonatology has been informed about the findings of the echocardiogram   membranes ruptured at 9:30 and on exam now  Fore waters  w were  ruptured also in as I said the patient was 9 cm Past surgical history negative Past medical history the patient has a prolonged QT syndrome and was treated with metoprolol Social history negative System review negative Physical exam well-developed female in no distress HEENT negative Lungs clear to P&A Heart regular rhythm no murmurs no gallops Breasts negative Abdomen term Pelvic as described above Extremities negative and

## 2013-10-25 NOTE — Progress Notes (Signed)
UR completed 

## 2013-10-26 ENCOUNTER — Ambulatory Visit (HOSPITAL_COMMUNITY): Payer: Medicaid Other

## 2013-10-26 LAB — CBC
HCT: 33.5 % — ABNORMAL LOW (ref 36.0–46.0)
Hemoglobin: 11 g/dL — ABNORMAL LOW (ref 12.0–15.0)
MCH: 28.1 pg (ref 26.0–34.0)
MCHC: 32.8 g/dL (ref 30.0–36.0)
MCV: 85.5 fL (ref 78.0–100.0)
PLATELETS: 122 10*3/uL — AB (ref 150–400)
RBC: 3.92 MIL/uL (ref 3.87–5.11)
RDW: 14.1 % (ref 11.5–15.5)
WBC: 12.8 10*3/uL — ABNORMAL HIGH (ref 4.0–10.5)

## 2013-10-26 NOTE — Progress Notes (Signed)
Patient ID: Courtney Rice, female   DOB: 10/08/1990, 23 y.o.   MRN: [id number] Postpartum day one Vital signs normal Fundus firm Lochia moderate Legs negative Doing well

## 2013-10-27 MED ORDER — IBUPROFEN 600 MG PO TABS: 600.0000 mg | ORAL_TABLET | Freq: Four times a day (QID) | ORAL | Status: DC | PRN

## 2013-10-27 MED ORDER — OXYCODONE-ACETAMINOPHEN 5-325 MG PO TABS: 1.0000 | ORAL_TABLET | ORAL | Status: DC | PRN

## 2013-10-27 NOTE — Progress Notes (Signed)
Post Partum Day 2 Subjective: no complaints  Objective: Blood pressure 109/70, pulse 70, temperature 98.7 F (37.1 C), temperature source Oral, resp. rate 18, height 5\' 4"  (1.626 m), weight 152 lb (68.947 kg), last menstrual period 01/08/2013, SpO2 99.00%, unknown if currently breastfeeding.  Physical Exam:  General: alert and no distress Lochia: appropriate Uterine Fundus: firm Incision: none DVT Evaluation: No evidence of DVT seen on physical exam.   Recent Labs  10/24/13 1935 10/26/13 0601  HGB 12.9 11.0*  HCT 37.6 33.5*    Assessment/Plan: Discharge home   LOS: 3 days   Jashawna Reever A 10/27/2013, 7:00 AM

## 2013-10-27 NOTE — Discharge Summary (Signed)
Obstetric Discharge Summary Reason for Admission: onset of labor Prenatal Procedures: ultrasound Intrapartum Procedures: spontaneous vaginal delivery Postpartum Procedures: none Complications-Operative and Postpartum: none Hemoglobin  Date Value Ref Range Status  10/26/2013 11.0* 12.0 - 15.0 g/dL Final     HCT  Date Value Ref Range Status  10/26/2013 33.5* 36.0 - 46.0 % Final    Physical Exam:  General: alert and no distress Lochia: appropriate Uterine Fundus: firm Incision: none DVT Evaluation: No evidence of DVT seen on physical exam.  Discharge Diagnoses: Term Pregnancy-delivered  Discharge Information: Date: 10/27/2013 Activity: pelvic rest Diet: routine Medications: PNV, Ibuprofen, Colace and Percocet Condition: stable Instructions: refer to practice specific booklet Discharge to: home Follow-up Information   Follow up with MARSHALL,BERNARD A, MD. Schedule an appointment as soon as possible for a visit in 6 weeks.   Specialty:  Obstetrics and Gynecology   Contact information:   [address] [place] [address] [phone]       Newborn Data: Live born female  Birth Weight: 6 lb 9.4 oz (2988 g) APGAR: 9, 9  Home with mother.  Infant Doane A 10/27/2013, 7:08 AM

## 2013-10-27 NOTE — Discharge Instructions (Signed)
Before Baby Comes Home °Ask any questions about feeding, diapering, and baby care before you leave the hospital. Ask again if you do not understand. Ask when you need to see the doctor again. °There are several things you must have before your baby comes home. °· Infant car seat. °· Crib. °· Do not let your baby sleep in a bed with you or anyone else. °· If you do not have a bed for your baby, ask the doctor what you can use that will be safe for the baby to sleep in. °Infant feeding supplies: °· 6 to 8 bottles (8 oz. size). °· 6 to 8 nipples. °· Measuring cup. °· Measuring tablespoon. °· Bottle brush. °· Sterilizer (or use any large pan or kettle with a lid). °· Formula that contains iron. °· A way to boil and cool water. °Breastfeeding supplies: °· Breast pump. °· Nipple cream. °Clothing: °· 24 to 36 cloth diapers and waterproof diaper covers or a box of disposable diapers. You may need as many as 10 to 12 diapers per day. °· 3 onesies (other clothing will depend on the time of year and the weather). °· 3 receiving blankets. °· 3 baby pajamas or gowns. °· 3 bibs. °Bath equipment: °· Mild soap. °· Petroleum jelly. No baby oil or powder. °· Soft cloth towel and wash cloth. °· Cotton balls. °· Separate bath basin for baby. Only sponge bathe until umbilical cord and circumcision are healed. °Other supplies: °· Thermometer and bulb syringe (ask the hospital to send them home with you). Ask your doctor about how you should take your baby's temperature. °· One to two pacifiers. °Prepare for an emergency: °· Know how to get to the hospital and know where to admit your baby. °· Put all doctor numbers near your house phone and in your cell phone if you have one. °Prepare your family: °· Talk with siblings about the baby coming home and how they feel about it. °· Decide how you want to handle visitors and other family members. °· Take offers for help with the baby. You will need time to adjust. °Know when to call the doctor.   °GET HELP RIGHT AWAY IF: °· Your baby's temperature is greater than 100.4° F (38° C). °· The softspot on your baby's head starts to bulge. °· Your baby is crying with no tears or has no wet diapers for 6 hours. °· Your baby has rapid breathing. °· Your baby is not as alert. °Document Released: 07/01/2008 Document Revised: 10/11/2011 Document Reviewed: 10/08/2010 °ExitCare® Patient Information ©2014 [employer]. ° °Breastfeeding °Deciding to breastfeed is one of the best choices you can make for you and your baby. A change in hormones during pregnancy causes your breast tissue to grow and increases the number and size of your milk ducts. These hormones also allow proteins, sugars, and fats from your blood supply to make breast milk in your milk-producing glands. Hormones prevent breast milk from being released before your baby is born as well as prompt milk flow after birth. Once breastfeeding has begun, thoughts of your baby, as well as his or her sucking or crying, can stimulate the release of milk from your milk-producing glands.  °BENEFITS OF BREASTFEEDING °For Your Baby °· Your first milk (colostrum) helps your baby's digestive system function better.   °· There are antibodies in your milk that help your baby fight off infections.   °· Your baby has a lower incidence of asthma, allergies, and sudden infant death syndrome.   °·   The nutrients in breast milk are better for your baby than infant formulas and are designed uniquely for your baby's needs.   °· Breast milk improves your baby's brain development.   °· Your baby is less likely to develop other conditions, such as childhood obesity, asthma, or type 2 diabetes mellitus.   °For You  °· Breastfeeding helps to create a very special bond between you and your baby.   °· Breastfeeding is convenient. Breast milk is always available at the correct temperature and costs nothing.   °· Breastfeeding helps to burn calories and helps you lose the weight gained during  pregnancy.   °· Breastfeeding makes your uterus contract to its prepregnancy size faster and slows bleeding (lochia) after you give birth.   °· Breastfeeding helps to lower your risk of developing type 2 diabetes mellitus, osteoporosis, and breast or ovarian cancer later in life. °SIGNS THAT YOUR BABY IS HUNGRY °Early Signs of Hunger  °· Increased alertness or activity. °· Stretching. °· Movement of the head from side to side. °· Movement of the head and opening of the mouth when the corner of the mouth or cheek is stroked (rooting). °· Increased sucking sounds, smacking lips, cooing, sighing, or squeaking. °· Hand-to-mouth movements. °· Increased sucking of fingers or hands. °Late Signs of Hunger °· Fussing. °· Intermittent crying. °Extreme Signs of Hunger °Signs of extreme hunger will require calming and consoling before your baby will be able to breastfeed successfully. Do not wait for the following signs of extreme hunger to occur before you initiate breastfeeding:   °· Restlessness. °· A loud, strong cry. °·  Screaming. °BREASTFEEDING BASICS °Breastfeeding Initiation °· Find a comfortable place to sit or lie down, with your neck and back well supported. °· Place a pillow or rolled up blanket under your baby to bring him or her to the level of your breast (if you are seated). Nursing pillows are specially designed to help support your arms and your baby while you breastfeed. °· Make sure that your baby's abdomen is facing your abdomen.   °· Gently massage your breast. With your fingertips, massage from your chest wall toward your nipple in a circular motion. This encourages milk flow. You may need to continue this action during the feeding if your milk flows slowly. °· Support your breast with 4 fingers underneath and your thumb above your nipple. Make sure your fingers are well away from your nipple and your baby's mouth.   °· Stroke your baby's lips gently with your finger or nipple.   °· When your baby's  mouth is open wide enough, quickly bring your baby to your breast, placing your entire nipple and as much of the colored area around your nipple (areola) as possible into your baby's mouth.   °· More areola should be visible above your baby's upper lip than below the lower lip.   °· Your baby's tongue should be between his or her lower gum and your breast.   °· Ensure that your baby's mouth is correctly positioned around your nipple (latched). Your baby's lips should create a seal on your breast and be turned out (everted). °· It is common for your baby to suck about 2 3 minutes in order to start the flow of breast milk. °Latching °Teaching your baby how to latch on to your breast properly is very important. An improper latch can cause nipple pain and decreased milk supply for you and poor weight gain in your baby. Also, if your baby is not latched onto your nipple properly, he or she may swallow some   air during feeding. This can make your baby fussy. Burping your baby when you switch breasts during the feeding can help to get rid of the air. However, teaching your baby to latch on properly is still the best way to prevent fussiness from swallowing air while breastfeeding. °Signs that your baby has successfully latched on to your nipple:    °· Silent tugging or silent sucking, without causing you pain.   °· Swallowing heard between every 3 4 sucks.   °·  Muscle movement above and in front of his or her ears while sucking.   °Signs that your baby has not successfully latched on to nipple:  °· Sucking sounds or smacking sounds from your baby while breastfeeding. °· Nipple pain. °If you think your baby has not latched on correctly, slip your finger into the corner of your baby's mouth to break the suction and place it between your baby's gums. Attempt breastfeeding initiation again. °Signs of Successful Breastfeeding °Signs from your baby:   °· A gradual decrease in the number of sucks or complete cessation of sucking.    °· Falling asleep.   °· Relaxation of his or her body.   °· Retention of a small amount of milk in his or her mouth.   °· Letting go of your breast by himself or herself. °Signs from you: °· Breasts that have increased in firmness, weight, and size 1 3 hours after feeding.   °· Breasts that are softer immediately after breastfeeding. °· Increased milk volume, as well as a change in milk consistency and color by the 5th day of breastfeeding.   °· Nipples that are not sore, cracked, or bleeding. °Signs That Your Baby is Getting Enough Milk °· Wetting at least 3 diapers in a 24-hour period. The urine should be clear and pale yellow by age 5 days. °· At least 3 stools in a 24-hour period by age 5 days. The stool should be soft and yellow. °· At least 3 stools in a 24-hour period by age 7 days. The stool should be seedy and yellow. °· No loss of weight greater than 10% of birth weight during the first 3 days of age. °· Average weight gain of 4 7 ounces (120 210 mL) per week after age 4 days. °· Consistent daily weight gain by age 5 days, without weight loss after the age of 2 weeks. °After a feeding, your baby may spit up a small amount. This is common. °BREASTFEEDING FREQUENCY AND DURATION °Frequent feeding will help you make more milk and can prevent sore nipples and breast engorgement. Breastfeed when you feel the need to reduce the fullness of your breasts or when your baby shows signs of hunger. This is called "breastfeeding on demand." Avoid introducing a pacifier to your baby while you are working to establish breastfeeding (the first 4 6 weeks after your baby is born). After this time you may choose to use a pacifier. Research has shown that pacifier use during the first year of a baby's life decreases the risk of sudden infant death syndrome (SIDS). °Allow your baby to feed on each breast as long as he or she wants. Breastfeed until your baby is finished feeding. When your baby unlatches or falls asleep while  feeding from the first breast, offer the second breast. Because newborns are often sleepy in the first few weeks of life, you may need to awaken your baby to get him or her to feed. °Breastfeeding times will vary from baby to baby. However, the following rules can serve as a guide   to help you ensure that your baby is properly fed: °· Newborns (babies 4 weeks of age or younger) may breastfeed every 1 3 hours. °· Newborns should not go longer than 3 hours during the day or 5 hours during the night without breastfeeding. °· You should breastfeed your baby a minimum of 8 times in a 24-hour period until you begin to introduce solid foods to your baby at around 6 months of age. °BREAST MILK PUMPING °Pumping and storing breast milk allows you to ensure that your baby is exclusively fed your breast milk, even at times when you are unable to breastfeed. This is especially important if you are going back to work while you are still breastfeeding or when you are not able to be present during feedings. Your lactation consultant can give you guidelines on how long it is safe to store breast milk.  °A breast pump is a machine that allows you to pump milk from your breast into a sterile bottle. The pumped breast milk can then be stored in a refrigerator or freezer. Some breast pumps are operated by hand, while others use electricity. Ask your lactation consultant which type will work best for you. Breast pumps can be purchased, but some hospitals and breastfeeding support groups lease breast pumps on a monthly basis. A lactation consultant can teach you how to hand express breast milk, if you prefer not to use a pump.  °CARING FOR YOUR BREASTS WHILE YOU BREASTFEED °Nipples can become dry, cracked, and sore while breastfeeding. The following recommendations can help keep your breasts moisturized and healthy: °· Avoid using soap on your nipples.   °· Wear a supportive bra. Although not required, special nursing bras and tank tops  are designed to allow access to your breasts for breastfeeding without taking off your entire bra or top. Avoid wearing underwire style bras or extremely tight bras. °· Air dry your nipples for 3 4 minutes after each feeding.   °· Use only cotton bra pads to absorb leaked breast milk. Leaking of breast milk between feedings is normal.   °· Use lanolin on your nipples after breastfeeding. Lanolin helps to maintain your skin's normal moisture barrier. If you use pure lanolin you do not need to wash it off before feeding your baby again. Pure lanolin is not toxic to your baby. You may also hand express a few drops of breast milk and gently massage that milk into your nipples and allow the milk to air dry. °In the first few weeks after giving birth, some women experience extremely full breasts (engorgement). Engorgement can make your breasts feel heavy, warm, and tender to the touch. Engorgement peaks within 3 5 days after you give birth. The following recommendations can help ease engorgement: °· Completely empty your breasts while breastfeeding or pumping. You may want to start by applying warm, moist heat (in the shower or with warm water-soaked hand towels) just before feeding or pumping. This increases circulation and helps the milk flow. If your baby does not completely empty your breasts while breastfeeding, pump any extra milk after he or she is finished. °· Wear a snug bra (nursing or regular) or tank top for 1 2 days to signal your body to slightly decrease milk production. °· Apply ice packs to your breasts, unless this is too uncomfortable for you. °· Make sure that your baby is latched on and positioned properly while breastfeeding. °If engorgement persists after 48 hours of following these recommendations, contact your health care provider or a lactation   consultant. °OVERALL HEALTH CARE RECOMMENDATIONS WHILE BREASTFEEDING °· Eat healthy foods. Alternate between meals and snacks, eating 3 of each per day.  Because what you eat affects your breast milk, some of the foods may make your baby more irritable than usual. Avoid eating these foods if you are sure that they are negatively affecting your baby. °· Drink milk, fruit juice, and water to satisfy your thirst (about 10 glasses a day).   °· Rest often, relax, and continue to take your prenatal vitamins to prevent fatigue, stress, and anemia. °· Continue breast self-awareness checks. °· Avoid chewing and smoking tobacco. °· Avoid alcohol and drug use. °Some medicines that may be harmful to your baby can pass through breast milk. It is important to ask your health care provider before taking any medicine, including all over-the-counter and prescription medicine as well as vitamin and herbal supplements. °It is possible to become pregnant while breastfeeding. If birth control is desired, ask your health care provider about options that will be safe for your baby. °SEEK MEDICAL CARE IF:  °· You feel like you want to stop breastfeeding or have become frustrated with breastfeeding. °· You have painful breasts or nipples. °· Your nipples are cracked or bleeding. °· Your breasts are red, tender, or warm. °· You have a swollen area on either breast. °· You have a fever or chills. °· You have nausea or vomiting. °· You have drainage other than breast milk from your nipples. °· Your breasts do not become full before feedings by the 5th day after you give birth. °· You feel sad and depressed. °· Your baby is too sleepy to eat well. °· Your baby is having trouble sleeping.   °· Your baby is wetting less than 3 diapers in a 24-hour period. °· Your baby has less than 3 stools in a 24-hour period. °· Your baby's skin or the white part of his or her eyes becomes yellow.   °· Your baby is not gaining weight by 5 days of age. °SEEK IMMEDIATE MEDICAL CARE IF:  °· Your baby is overly tired (lethargic) and does not want to wake up and feed. °· Your baby develops an unexplained  fever. °Document Released: 07/19/2005 Document Revised: 03/21/2013 Document Reviewed: 01/10/2013 °Exit[employer]® Patient Information ©2014 [employer]. ° °

## 2013-11-28 ENCOUNTER — Encounter (HOSPITAL_COMMUNITY): Payer: Self-pay

## 2013-11-28 ENCOUNTER — Inpatient Hospital Stay (HOSPITAL_COMMUNITY)
Admission: AD | Admit: 2013-11-28 | Discharge: 2013-11-30 | DRG: 312 | Disposition: A | Discharge status: Home / Self Care | Payer: Medicaid Other | Source: Ambulatory Visit | Attending: Internal Medicine | Admitting: Internal Medicine

## 2013-11-28 ENCOUNTER — Encounter (INDEPENDENT_AMBULATORY_CARE_PROVIDER_SITE_OTHER): Payer: Self-pay

## 2013-11-28 ENCOUNTER — Encounter: Payer: Self-pay | Admitting: Cardiology

## 2013-11-28 ENCOUNTER — Ambulatory Visit (INDEPENDENT_AMBULATORY_CARE_PROVIDER_SITE_OTHER): Payer: Medicaid Other | Admitting: Cardiology

## 2013-11-28 VITALS — BP 140/82 | HR 71 | Wt 130.0 lb

## 2013-11-28 DIAGNOSIS — I4729 Other ventricular tachycardia: Secondary | ICD-10-CM | POA: Diagnosis present

## 2013-11-28 DIAGNOSIS — I498 Other specified cardiac arrhythmias: Secondary | ICD-10-CM | POA: Diagnosis present

## 2013-11-28 DIAGNOSIS — E876 Hypokalemia: Secondary | ICD-10-CM | POA: Diagnosis present

## 2013-11-28 DIAGNOSIS — I4581 Long QT syndrome: Secondary | ICD-10-CM | POA: Diagnosis present

## 2013-11-28 DIAGNOSIS — R55 Syncope and collapse: Secondary | ICD-10-CM

## 2013-11-28 DIAGNOSIS — I472 Ventricular tachycardia, unspecified: Secondary | ICD-10-CM | POA: Diagnosis present

## 2013-11-28 DIAGNOSIS — R079 Chest pain, unspecified: Secondary | ICD-10-CM

## 2013-11-28 DIAGNOSIS — R002 Palpitations: Secondary | ICD-10-CM

## 2013-11-28 LAB — CBC WITH DIFFERENTIAL/PLATELET
Basophils Absolute: 0 10*3/uL (ref 0.0–0.1)
Basophils Relative: 0 % (ref 0–1)
Eosinophils Absolute: 0.1 10*3/uL (ref 0.0–0.7)
Eosinophils Relative: 3 % (ref 0–5)
HCT: 42.6 % (ref 36.0–46.0)
Hemoglobin: 14.3 g/dL (ref 12.0–15.0)
LYMPHS ABS: 3.2 10*3/uL (ref 0.7–4.0)
LYMPHS PCT: 64 % — AB (ref 12–46)
MCH: 28 pg (ref 26.0–34.0)
MCHC: 33.6 g/dL (ref 30.0–36.0)
MCV: 83.4 fL (ref 78.0–100.0)
MONOS PCT: 7 % (ref 3–12)
Monocytes Absolute: 0.4 10*3/uL (ref 0.1–1.0)
NEUTROS PCT: 26 % — AB (ref 43–77)
Neutro Abs: 1.3 10*3/uL — ABNORMAL LOW (ref 1.7–7.7)
Platelets: 198 10*3/uL (ref 150–400)
RBC: 5.11 MIL/uL (ref 3.87–5.11)
RDW: 14.3 % (ref 11.5–15.5)
WBC: 5 10*3/uL (ref 4.0–10.5)

## 2013-11-28 LAB — BASIC METABOLIC PANEL
BUN: 6 mg/dL (ref 6–23)
CHLORIDE: 105 meq/L (ref 96–112)
CO2: 30 meq/L (ref 19–32)
Calcium: 9.8 mg/dL (ref 8.4–10.5)
Creatinine, Ser: 0.65 mg/dL (ref 0.50–1.10)
GFR calc non Af Amer: >90 mL/min (ref >90)
Glucose, Bld: 83 mg/dL (ref 70–99)
Potassium: 3.4 mEq/L — ABNORMAL LOW (ref 3.7–5.3)
Sodium: 145 mEq/L (ref 137–147)

## 2013-11-28 LAB — MAGNESIUM: Magnesium: 2 mg/dL (ref 1.5–2.5)

## 2013-11-28 LAB — TROPONIN I: Troponin I: <0.3 ng/mL (ref <0.30)

## 2013-11-28 LAB — TSH: TSH: 0.618 u[IU]/mL (ref 0.350–4.500)

## 2013-11-28 LAB — T4, FREE: Free T4: 1.06 ng/dL (ref 0.80–1.80)

## 2013-11-28 MED ORDER — ACETAMINOPHEN 325 MG PO TABS
650.0000 mg | ORAL_TABLET | ORAL | Status: DC | PRN
  Administered 2013-11-28 – 2013-11-30 (×5): 650 mg via ORAL
  Filled 2013-11-28 (×5): qty 2

## 2013-11-28 MED ORDER — MAGNESIUM SULFATE 40 MG/ML IJ SOLN
2.0000 g | Freq: Once | INTRAMUSCULAR | Status: AC
Start: 2013-11-28 — End: 2013-11-28
  Administered 2013-11-28: 2 g via INTRAVENOUS
  Filled 2013-11-28: qty 50

## 2013-11-28 MED ORDER — PROPRANOLOL HCL 1 MG/ML IV SOLN
1.0000 mg | Freq: Once | INTRAVENOUS | Status: AC
  Administered 2013-11-28: 1 mg via INTRAVENOUS
  Filled 2013-11-28: qty 1

## 2013-11-28 MED ORDER — METOPROLOL TARTRATE 50 MG PO TABS
50.0000 mg | ORAL_TABLET | Freq: Two times a day (BID) | ORAL | Status: DC
  Filled 2013-11-28: qty 1

## 2013-11-28 MED ORDER — PROPRANOLOL HCL 40 MG PO TABS
40.0000 mg | ORAL_TABLET | Freq: Two times a day (BID) | ORAL | Status: DC
  Administered 2013-11-28 – 2013-11-30 (×4): 40 mg via ORAL
  Filled 2013-11-28 (×5): qty 1

## 2013-11-28 NOTE — Patient Instructions (Addendum)
Your Physician recommends you be admitted to the Hospital Telemetry Bed Unit 3 west    Your physician recommends that you continue on your current medications as directed. Please refer to the Current Medication list given to you today.

## 2013-11-28 NOTE — Progress Notes (Signed)
ELECTROPHYSIOLOGY OFFICE NOTE   Patient ID: Courtney Rice MRN: [id number], DOB/AGE: 22/10/1990   Date of Visit: 11/28/2013  Primary Physician: Francoise CeoBernard Marshall, MD Primary Cardiologist: Lewayne BuntingGregg Taylor, MD Reason for Visit: Syncope  History of Present Illness  Courtney Birdie HopesM Rice is a 23 y.o. female with long QT syndrome who presents today for evaluation of syncope. Since last being seen in our clinic, she delivered a baby boy Courtney Burton(Jaleel) on 10/25/2013. She tells me she had a normal delivery. She nor the baby had any complications. She has been feeling like her usual self up until one week ago. One week ago she experienced 2 episodes of abrupt syncope. Both occurred while standing but not after standing for lengthy period, nor had she just changed position. She has had orthostatic symptoms in the past where she notices dizziness with postural changes and this lasts several minutes at at time and she always has warning / prodrome and can get to safe place without ever losing consciousness. Both of these episodes last week occurred abruptly, without warning or prodrome. She describes feeling lightheaded accompanied by "funny feeling, racing" palpitations for 3-4 seconds prior to LOC. These were not witnessed. She states after regaining consciousness she feels fine, back to her usual self. She is accompanied today by her boyfriend who reports yesterday morning he found her unresponsive for several seconds and had seizure-like activity, "her arms shaking and eyes rolled back in head." The only thing she recalls about this episode is "waking up with pain all over and my heart racing." Currently, she denies CP, SOB or palpitations. Post delivery she has continued metoprolol tartrate 12.5 mg twice daily. She doesn't take any other medications. She does not smoke, drink alcohol or use illicit drugs. She has a strong family history of long QT. Her mother and grandmother both have ICDs but have not had documented  ventricular arrhythmias.  Past Medical History Past Medical History  Diagnosis Date  . Long Q-T syndrome     Past Surgical History Past Surgical History  Procedure Laterality Date  . Tonsillectomy      Allergies/Intolerances No Known Allergies  Current Home Medications Current Outpatient Prescriptions  Medication Sig Dispense Refill  . ibuprofen (ADVIL,MOTRIN) 600 MG tablet Take 1 tablet (600 mg total) by mouth every 6 (six) hours as needed.  30 tablet  5  . metoprolol succinate (TOPROL-XL) 25 MG 24 hr tablet Take 12.5 mg by mouth 2 (two) times daily.       No current facility-administered medications for this visit.    Social History History   Social History  . Marital Status: Single    Spouse Name: N/A    Number of Children: N/A  . Years of Education: N/A   Occupational History  . Not on file.   Social History Main Topics  . Smoking status: Never Smoker   . Smokeless tobacco: Never Used  . Alcohol Use: No  . Drug Use: No  . Sexual Activity: Yes   Other Topics Concern  . Not on file   Social History Narrative  . No narrative on file     Review of Systems General: No chills, fever, night sweats or weight changes Cardiovascular: No chest pain, dyspnea on exertion, edema, orthopnea, palpitations, paroxysmal nocturnal dyspnea Dermatological: No rash, lesions or masses Respiratory: No cough, dyspnea Urologic: No hematuria, dysuria Abdominal: No nausea, vomiting, diarrhea, bright red blood per rectum, melena, or hematemesis Neurologic: No visual changes All other systems reviewed and are otherwise  negative except as noted above.  Physical Exam Vitals: Blood pressure 140/82, pulse 71, weight 130 lb (58.968 kg), unknown if currently breastfeeding.  General: Well developed, well appearing 23 y.o. female in no acute distress. HEENT: Normocephalic, atraumatic. EOMs intact. Sclera nonicteric. Oropharynx clear.  Neck: Supple. No JVD. Lungs: Respirations regular  and unlabored, CTA bilaterally. No wheezes, rales or rhonchi. Heart: RRR. S1, S2 present. No murmurs, rub, S3 or S4. Abdomen: Soft, non-tender, non-distended. BS present x 4 quadrants. Extremities: No clubbing, cyanosis or edema. DP/PT/Radials 2+ and equal bilaterally. Psych: Normal affect. Neuro: Alert and oriented X 3. Moves all extremities spontaneously.   Diagnostics 12-lead ECG today - NSR at 71 bpm; PR 144, QRS 72, corrected QTc 524  Assessment and Plan 1. Syncope 2. Long QT syndrome 3. One month post partum  [name] syncope is worrisome. I have discussed her case with Dr. Ladona Ridgelaylor via phone. She will be admitted to telemetry for close observation. Will increase her BB. No driving for 6 months until given clearance by Dr. Ladona Ridgelaylor. She meets criteria for ICD therapy. We discussed ICD therapy / implantation, with which she is familiar given her family history. Dr. Ladona Ridgelaylor to see in the morning.   Jorja LoaSigned, Astha Probasco O Trisha Ken, PA-C 11/28/2013, 11:57 AM

## 2013-11-28 NOTE — H&P (Signed)
Pt with very worrisome syncope for the first time abrupt in onset and offset  and agonal respirations the other night with gurgling respirations  She is positive LQTS2 which is consistent with her ECGs and this is the highest risk group for post partum events   She now has documented VT PM on telemetry  We will begin on IV propranolol and use IV Mag  I have reached out to EP colleagues, Dr Alric QuanPriori and Genevie AnnWilde and Dr Ace GinsBuck to get input as to what the best thoughts are re ICD   Her High risk post partum time will come to an end,  Would betablockers be sufficient then,   I agree with GT taht nadolol( not previously tolerated) or propranolol are preferred and at much higher doses Could a lifevest be used to get us to a lower risk time How do we help her with nocturnal triggers--babies crying etc to minimize risk If we put in ICD would favor SICD given her youth

## 2013-11-29 ENCOUNTER — Encounter (HOSPITAL_COMMUNITY): Payer: Self-pay | Admitting: Anesthesiology

## 2013-11-29 DIAGNOSIS — I4729 Other ventricular tachycardia: Secondary | ICD-10-CM

## 2013-11-29 DIAGNOSIS — I472 Ventricular tachycardia: Secondary | ICD-10-CM

## 2013-11-29 LAB — TROPONIN I

## 2013-11-29 MED ORDER — SODIUM CHLORIDE 0.9 % IV SOLN
INTRAVENOUS | Status: DC
  Administered 2013-11-30: 07:00:00 via INTRAVENOUS

## 2013-11-29 MED ORDER — CHLORHEXIDINE GLUCONATE 4 % EX LIQD
60.0000 mL | Freq: Once | CUTANEOUS | Status: AC
  Administered 2013-11-29: 4 via TOPICAL
  Filled 2013-11-29: qty 60

## 2013-11-29 MED ORDER — CEFAZOLIN SODIUM-DEXTROSE 2-3 GM-% IV SOLR
2.0000 g | INTRAVENOUS | Status: DC
  Filled 2013-11-29: qty 50

## 2013-11-29 MED ORDER — GENTAMICIN SULFATE 40 MG/ML IJ SOLN
80.0000 mg | INTRAMUSCULAR | Status: DC
  Filled 2013-11-29: qty 2

## 2013-11-29 MED ORDER — CHLORHEXIDINE GLUCONATE 4 % EX LIQD
60.0000 mL | Freq: Once | CUTANEOUS | Status: AC
  Administered 2013-11-30: 4 via TOPICAL
  Filled 2013-11-29: qty 60

## 2013-11-29 NOTE — Progress Notes (Signed)
UR Completed.  Courtney Rice Courtney Rice [phone] 11/29/2013  

## 2013-11-29 NOTE — Progress Notes (Signed)
Was in contyact with Dr Alric QuanPriori yday regarding situation   She recommends betablockers + ICD  Have discussed with DR GT but not yet with pt

## 2013-11-29 NOTE — Progress Notes (Signed)
    Patient: Courtney Rice Date of Encounter: 11/29/2013, 8:49 AM Admit date: 11/28/2013     Subjective  Courtney Rice has no new complaints this AM. She denies recurrent dizziness, near syncope or syncope. She denies CP or SOB.  Admitted yesterday with abrupt syncope in setting of long QT syndrome and one month post-partum. On tele, had short runs of PMVT. Started on propranolol and IV magnesium.   Objective  Physical Exam: Vitals: BP 138/90  Pulse 56  Temp(Src) 97.9 F (36.6 C) (Oral)  Resp 16  Ht 5\' 4"  (1.626 m)  Wt 128 lb 13.9 oz (58.455 kg)  BMI 22.11 kg/m2  SpO2 100% General: Well developed, well appearing 23 year old female in no acute distress. Neck: Supple. JVD not elevated. Lungs: Clear bilaterally to auscultation without wheezes, rales, or rhonchi. Breathing is unlabored. Heart: RRR S1 S2 without murmurs, rubs, or gallops.  Abdomen: Soft, non-distended. Extremities: No clubbing or cyanosis. No edema.  Distal pedal pulses are 2+ and equal bilaterally. Neuro: Alert and oriented X 3. Moves all extremities spontaneously. No focal deficits.  Intake/Output: No intake or output data in the 24 hours ending 11/29/13 0849  Inpatient Medications:  . propranolol  40 mg Oral BID    Labs:  Recent Labs  11/28/13 1536  NA 145  K 3.4*  CL 105  CO2 30  GLUCOSE 83  BUN 6  CREATININE 0.65  CALCIUM 9.8  MG 2.0    Recent Labs  11/28/13 1536  WBC 5.0  NEUTROABS 1.3*  HGB 14.3  HCT 42.6  MCV 83.4  PLT 198    Recent Labs  11/28/13 1536 11/28/13 2041 11/29/13 0145  TROPONINI <0.30 <0.30 <0.30    Recent Labs  11/28/13 1536  TSH 0.618    Radiology/Studies: No results found.  12-lead ECG on admission - NSR at 71 bpm; PR 144, QRS 72, corrected QTc 524 Telemetry: SR in 60s currently; yesterday afternoon had short runs of PMVT   Assessment and Plan  Polymorphic VT Long QT2 Syncope Post-partum state  Continue propranolol. She meets criteria for ICD  therapy. We discussed ICD therapy / implantation, with which she is familiar given her family history. No driving for 6 months.   Signed, Courtney Rice  Electrophysiology attending  Patient seen and examined. I discussed situation with the patient as well as her mother and grandmother who are both patients of mine. She has had syncope, polymorphic ventricular tachycardia all in the setting of long QT syndrome and despite medical therapy with beta blockers. She is a class I indication for ICD implantation. Because of her young age, we have recommended insertion of a subcutaneous ICD. She is reflecting on her options.  Courtney Rice, M.D.

## 2013-11-30 ENCOUNTER — Encounter (HOSPITAL_COMMUNITY)
Admission: AD | Disposition: A | Discharge status: Home / Self Care | Payer: Self-pay | Source: Ambulatory Visit | Attending: Internal Medicine

## 2013-11-30 ENCOUNTER — Other Ambulatory Visit: Payer: Self-pay

## 2013-11-30 ENCOUNTER — Encounter (HOSPITAL_COMMUNITY): Payer: Self-pay | Admitting: Internal Medicine

## 2013-11-30 DIAGNOSIS — I4729 Other ventricular tachycardia: Secondary | ICD-10-CM | POA: Diagnosis not present

## 2013-11-30 DIAGNOSIS — I472 Ventricular tachycardia: Secondary | ICD-10-CM | POA: Diagnosis not present

## 2013-11-30 LAB — BASIC METABOLIC PANEL
BUN: 6 mg/dL (ref 6–23)
CO2: 27 mEq/L (ref 19–32)
Calcium: 9.1 mg/dL (ref 8.4–10.5)
Chloride: 104 mEq/L (ref 96–112)
Creatinine, Ser: 0.64 mg/dL (ref 0.50–1.10)
Glucose, Bld: 91 mg/dL (ref 70–99)
Potassium: 3.9 mEq/L (ref 3.7–5.3)
Sodium: 141 mEq/L (ref 137–147)

## 2013-11-30 SURGERY — ICD GENERATOR CHANGE
Anesthesia: General

## 2013-11-30 MED ORDER — FENTANYL CITRATE 0.05 MG/ML IJ SOLN
INTRAMUSCULAR | Status: AC
  Filled 2013-11-30: qty 5

## 2013-11-30 MED ORDER — POTASSIUM CHLORIDE 10 MEQ/100ML IV SOLN
10.0000 meq | INTRAVENOUS | Status: AC
  Administered 2013-11-30 (×3): 10 meq via INTRAVENOUS
  Filled 2013-11-30 (×3): qty 100

## 2013-11-30 MED ORDER — PROPRANOLOL HCL 40 MG PO TABS: 40.0000 mg | ORAL_TABLET | Freq: Two times a day (BID) | ORAL | Status: DC

## 2013-11-30 MED ORDER — MIDAZOLAM HCL 2 MG/2ML IJ SOLN
INTRAMUSCULAR | Status: AC
  Filled 2013-11-30: qty 2

## 2013-11-30 NOTE — Progress Notes (Signed)
       Patient Name: Courtney Rice      SUBJECTIVE: wtihout compalints   Past Medical History  Diagnosis Date  . Long Q-T syndrome   . Long Q-T syndrome 06/06/2013    Scheduled Meds:  Scheduled Meds: .  ceFAZolin (ANCEF) IV  2 g Intravenous On Call  . gentamicin irrigation  80 mg Irrigation On Call  . potassium chloride  10 mEq Intravenous Q1 Hr x 3  . propranolol  40 mg Oral BID   Continuous Infusions: . sodium chloride 50 mL/hr at 11/30/13 0641    PHYSICAL EXAM Filed Vitals:   11/29/13 0401 11/29/13 1345 11/29/13 2142 11/30/13 0655  BP: 138/90 141/91 128/80 142/84  Pulse: 56 53 71 62  Temp: 97.9 F (36.6 C) 98.5 F (36.9 C) 98 F (36.7 C) 98 F (36.7 C)  TempSrc: Oral Oral Oral Oral  Resp: 16 17 18 18   Height:      Weight: 128 lb 13.9 oz (58.455 kg) 130 lb 3.2 oz (59.058 kg)  128 lb 3.2 oz (58.151 kg)  SpO2: 100% 100% 98% 97%  Well developed and nourished in no acute distress HENT normal Neck supple with JVP-flat Carotids brisk and full without bruits Clear Regular rate and rhythm, no murmurs or gallops Abd-soft with active BS without hepatomegaly No Clubbing cyanosis edema Skin-warm and dry A & Oriented  Grossly normal sensory and motor function nction   TELEMETRY: Reviewed telemetry pt in  Sinus with rates into 40.50s:   No intake or output data in the 24 hours ending 11/30/13 0847  LABS: Basic Metabolic Panel:  Recent Labs Lab 11/28/13 1536  NA 145  K 3.4*  CL 105  CO2 30  GLUCOSE 83  BUN 6  CREATININE 0.65  CALCIUM 9.8  MG 2.0   Cardiac Enzymes:  Recent Labs  11/28/13 1536 11/28/13 2041 11/29/13 0145  TROPONINI <0.30 <0.30 <0.30   CBC:  Recent Labs Lab 11/28/13 1536  WBC 5.0  NEUTROABS 1.3*  HGB 14.3  HCT 42.6  MCV 83.4  PLT 198   PROTIME: No results found for this basename: LABPROT, INR,  in the last 72 hours Liver Function Tests: No results found for this basename: AST, ALT, ALKPHOS, BILITOT, PROT,  ALBUMIN,  in the last 72 hours No results found for this basename: LIPASE, AMYLASE,  in the last 72 hours BNP: BNP (last 3 results) No results found for this basename: PROBNP,  in the last 8760 hours D-Dimer: No results found for this basename: DDIMER,  in the last 72 hours Hemoglobin A1C: No results found for this basename: HGBA1C,  in the last 72 hours Fasting Lipid Panel: No results found for this basename: CHOL, HDL, LDLCALC, TRIG, CHOLHDL, LDLDIRECT,  in the last 72 hours Thyroid Function Tests:  Recent Labs  11/28/13 1536  TSH 0.618    ASSESSMENT AND PLAN:  Active Problems:   Syncope   Long Q-T syndrome hypokalemia Sinus brady  Lengthy discussion  She is not inclined at this point to proceed with an implantable device  She will decide later this am She certainly has an alternative with a wearable defibrillator which is very acceptable in the short intermediate term Her bradycardia amy be an issue but without further vt on the current dose of betablockers we will ( after discussionwith GT) proceed withS-ICD if she chooses at all Replete K which is low   Signed, Duke SalviaSteven C Analiah Drum MD  11/30/2013

## 2013-11-30 NOTE — Progress Notes (Signed)
Pts assessment unchanged from this am. Life vest fitted and pt d/c'd via wheelchair with staff and boyfriend to private vehicle

## 2013-11-30 NOTE — Discharge Instructions (Signed)
No driving for 6 months or until cleared by Dr Ladona Ridgelaylor

## 2013-11-30 NOTE — Progress Notes (Signed)
I spent 45 minutes with the family today following her decision to not pursue defibrillator implantation. She at this juncture would like to pursue it, but at this juncture we have lost the opportunity to do it today. Hence, we will use a LifeVest for protection. We'll arrange outpatient as ICD implantation. She'll be discharged on Inderal.

## 2013-11-30 NOTE — Discharge Summary (Signed)
Patient ID: Courtney Rice,  MRN: [id number], DOB/AGE: 22-11-1990 22 y.o.  Admit date: 11/28/2013 Discharge date: 11/30/2013  Primary Care Provider: Gracy Racer, MD  Primary Cardiologist: Dr Lovena Le  Discharge Diagnoses Principal Problem:   Syncope Active Problems:   Long Q-T syndrome   Polymorphic ventricular tachycardia    Hospital Course: Courtney Rice is a 23 y.o. female with long QT syndrome who presents today for evaluation of syncope. Since last being seen in our clinic, she delivered a baby boy Courtney Rice) on 10/25/2013. She has been feeling like her usual self up until one week ago. One week ago she experienced 2 episodes of abrupt syncope. Both occurred while standing but not after standing for lengthy period, nor had she just changed position. It was decided to admit her to telemetry from the office 11/28/13. There was discussion about SICD vs medical Rx and a Life Vest till she was out a little further from her high risk post partum state. She indicated she was not inclined to proceed with an implantable device. The plan is for Life Vest and beta blockers. She will follow up with EP in the office in a week or so.    Discharge Vitals:  Blood pressure 136/96, pulse 66, temperature 97.9 F (36.6 C), temperature source Oral, resp. rate 18, height 5' 4" (1.626 m), weight 128 lb 3.2 oz (58.151 kg), SpO2 100.00%, unknown if currently breastfeeding.    Labs: Results for orders placed during the hospital encounter of 11/28/13 (from the past 48 hour(s))  TROPONIN I     Status: None   Collection Time    11/28/13  8:41 PM      Result Value Ref Range   Troponin I <0.30  <0.30 ng/mL   Comment:            Due to the release kinetics of cTnI,     a negative result within the first hours     of the onset of symptoms does not rule out     myocardial infarction with certainty.     If myocardial infarction is still suspected,     repeat the test at appropriate intervals.    TROPONIN I     Status: None   Collection Time    11/29/13  1:45 AM      Result Value Ref Range   Troponin I <0.30  <0.30 ng/mL   Comment:            Due to the release kinetics of cTnI,     a negative result within the first hours     of the onset of symptoms does not rule out     myocardial infarction with certainty.     If myocardial infarction is still suspected,     repeat the test at appropriate intervals.  BASIC METABOLIC PANEL     Status: None   Collection Time    11/30/13  9:20 AM      Result Value Ref Range   Sodium 141  137 - 147 mEq/L   Potassium 3.9  3.7 - 5.3 mEq/L   Chloride 104  96 - 112 mEq/L   CO2 27  19 - 32 mEq/L   Glucose, Bld 91  70 - 99 mg/dL   BUN 6  6 - 23 mg/dL   Creatinine, Ser 0.64  0.50 - 1.10 mg/dL   Calcium 9.1  8.4 - 10.5 mg/dL   GFR calc non Af Amer >90  >  90 mL/min   GFR calc Af Amer >90  >90 mL/min   Comment: (NOTE)     The eGFR has been calculated using the CKD EPI equation.     This calculation has not been validated in all clinical situations.     eGFR's persistently <90 mL/min signify possible Chronic Kidney     Disease.    Disposition:      Follow-up Information   Follow up with Courtney Axe, MD On 12/11/2013. (2:45 pm)    Specialty:  Cardiology   Contact information:   [address] [place] [address] [phone]       Discharge Medications:    Medication List    STOP taking these medications       metoprolol succinate 25 MG 24 hr tablet  Commonly known as:  TOPROL-XL      TAKE these medications       ibuprofen 600 MG tablet  Commonly known as:  ADVIL,MOTRIN  Take 1 tablet (600 mg total) by mouth every 6 (six) hours as needed.     propranolol 40 MG tablet  Commonly known as:  INDERAL  Take 1 tablet (40 mg total) by mouth 2 (two) times daily.         Duration of Discharge Encounter: Greater than 30 minutes including physician time.  Angelena Form PA-C 11/30/2013 4:58 PM

## 2013-11-30 NOTE — Progress Notes (Signed)
Pt stated that at this point "I dont want to have the procedure done and I don't want the life vest either but if I have to put it on to get out of the hospital, I will." Amber notified

## 2013-11-30 NOTE — Progress Notes (Signed)
This RN asked patient several times throughout the shift to sign her consent form.  The last time I asked her if she would sign it was at 0700 this morning and she still refused to sign it at this time.

## 2013-12-04 NOTE — Discharge Summary (Signed)
seee prior notes

## 2013-12-06 ENCOUNTER — Telehealth: Payer: Self-pay | Admitting: *Deleted

## 2013-12-06 ENCOUNTER — Telehealth: Payer: Self-pay | Admitting: Internal Medicine

## 2013-12-06 NOTE — Telephone Encounter (Signed)
Left message to schedule S-ICD implant

## 2013-12-06 NOTE — Telephone Encounter (Signed)
Scheduled S-ICD implant for 01/11/14.  Pt and I will discuss details/lab/wound check by beginning of next week. She is agreeable to this.

## 2013-12-06 NOTE — Telephone Encounter (Signed)
Patient wants to know since she is wearing the life vest if she can drive? Please call and advise.

## 2013-12-07 NOTE — Telephone Encounter (Signed)
F/u  ° ° °Please advise of previous message. °

## 2013-12-07 NOTE — Telephone Encounter (Signed)
Left message advising she cannot drive for 6 months following syncopal episode per [facility].

## 2013-12-10 ENCOUNTER — Telehealth: Payer: Self-pay | Admitting: Internal Medicine

## 2013-12-10 NOTE — Telephone Encounter (Signed)
New message         Pt returning nurses call from Friday

## 2013-12-11 ENCOUNTER — Encounter: Payer: Self-pay | Admitting: *Deleted

## 2013-12-11 ENCOUNTER — Ambulatory Visit (INDEPENDENT_AMBULATORY_CARE_PROVIDER_SITE_OTHER): Payer: Medicaid Other | Admitting: Internal Medicine

## 2013-12-11 ENCOUNTER — Other Ambulatory Visit: Payer: Self-pay | Admitting: *Deleted

## 2013-12-11 ENCOUNTER — Encounter: Payer: Self-pay | Admitting: Internal Medicine

## 2013-12-11 VITALS — BP 119/75 | HR 76 | Ht 64.0 in | Wt 125.0 lb

## 2013-12-11 DIAGNOSIS — I4581 Long QT syndrome: Secondary | ICD-10-CM

## 2013-12-11 DIAGNOSIS — Z01812 Encounter for preprocedural laboratory examination: Secondary | ICD-10-CM

## 2013-12-11 NOTE — Telephone Encounter (Signed)
Pt and I discussed DMV regulations, and our recommendations of no driving for 6 months post syncopal episode. We also discussed upcoming procedure:  Scheduled 6/12, pre procedure lab 6/5, wound check 6/22. Reviewed procedure instructions with patient and will give her letter of instructions at office visit this afternoon.  Patient verbalized understanding and agreeable to plan.

## 2013-12-11 NOTE — Progress Notes (Signed)
      Patient Care Team: Kathreen CosierBernard A Marshall, MD as PCP - General (Obstetrics and Gynecology)   HPI  Courtney Rice is a 23 y.o. female With long QT postpartum syncope and polymorphic ventricular tachycardia. She is currently on a LifeVest. Anticipate subcutaneous ICD implantation. He's had no recurrent syncope.  Past Medical History  Diagnosis Date  . Long Q-T syndrome   . Long Q-T syndrome 06/06/2013    Past Surgical History  Procedure Laterality Date  . Tonsillectomy      Current Outpatient Prescriptions  Medication Sig Dispense Refill  . ibuprofen (ADVIL,MOTRIN) 600 MG tablet Take 1 tablet (600 mg total) by mouth every 6 (six) hours as needed.  30 tablet  5  . propranolol (INDERAL) 40 MG tablet Take 1 tablet (40 mg total) by mouth 2 (two) times daily.  60 tablet  11   No current facility-administered medications for this visit.    No Known Allergies  Review of Systems negative except from HPI and PMH  Physical Exam BP 119/75  Pulse 76  Ht 5\' 4"  (1.626 m)  Wt 125 lb (56.7 kg)  BMI 21.45 kg/m2  Well developed and nourished in no acute distress HENT normal Neck supple with JVP-flat Clear Regular rate and rhythm, no murmurs or gallops Abd-soft with active BS No Clubbing cyanosis edema Skin-warm and dry A & Oriented  Grossly normal sensory and motor function  EKG demonstrates sinus rhythm at 76 intervals 15/07/43 with a QTC of 490. There is one PVC  Assessment and  Plan  Long Q-T syndrome-2  Polymorphic ventricular tachycardia  Postpartum syncope  Plan to undertake subcutaneous ICD implantation. We'll also be looking into support groups for young lady's with a sense of diagnoses.

## 2013-12-12 NOTE — Telephone Encounter (Signed)
Dr. Graciela HusbandsKlein would like pt to go for procedure sooner than 6/12. Pt rescheduled to 5/21, arrive at hospital at 5:30am. Pt will stop by office Friday for pre procedure labs. Advised same instructions as before, just new date for procedure and labs. Patient verbalized understanding and agreeable to plan.

## 2013-12-12 NOTE — Telephone Encounter (Signed)
Called pt back to reschedule wound appt Changed to 6/1

## 2013-12-14 ENCOUNTER — Other Ambulatory Visit (INDEPENDENT_AMBULATORY_CARE_PROVIDER_SITE_OTHER): Payer: Medicaid Other

## 2013-12-14 ENCOUNTER — Ambulatory Visit: Payer: Medicaid Other

## 2013-12-14 DIAGNOSIS — I4581 Long QT syndrome: Secondary | ICD-10-CM

## 2013-12-14 DIAGNOSIS — Z01812 Encounter for preprocedural laboratory examination: Secondary | ICD-10-CM

## 2013-12-14 LAB — CBC WITH DIFFERENTIAL/PLATELET
Basophils Absolute: 0 10*3/uL (ref 0.0–0.1)
Basophils Relative: 0.7 % (ref 0.0–3.0)
EOS PCT: 2.2 % (ref 0.0–5.0)
Eosinophils Absolute: 0.1 10*3/uL (ref 0.0–0.7)
HEMATOCRIT: 40 % (ref 36.0–46.0)
Hemoglobin: 13.2 g/dL (ref 12.0–15.0)
Lymphocytes Relative: 61.5 % — ABNORMAL HIGH (ref 12.0–46.0)
Lymphs Abs: 2.6 10*3/uL (ref 0.7–4.0)
MCHC: 33.1 g/dL (ref 30.0–36.0)
MCV: 85.2 fl (ref 78.0–100.0)
MONOS PCT: 7.4 % (ref 3.0–12.0)
Monocytes Absolute: 0.3 10*3/uL (ref 0.1–1.0)
NEUTROS PCT: 28.2 % — AB (ref 43.0–77.0)
Neutro Abs: 1.2 10*3/uL — ABNORMAL LOW (ref 1.4–7.7)
PLATELETS: 226 10*3/uL (ref 150.0–400.0)
RBC: 4.69 Mil/uL (ref 3.87–5.11)
RDW: 17.1 % — ABNORMAL HIGH (ref 11.5–15.5)
WBC: 4.3 10*3/uL (ref 4.0–10.5)

## 2013-12-14 LAB — BASIC METABOLIC PANEL
BUN: 8 mg/dL (ref 6–23)
CO2: 28 mEq/L (ref 19–32)
CREATININE: 0.8 mg/dL (ref 0.4–1.2)
Calcium: 9.6 mg/dL (ref 8.4–10.5)
Chloride: 105 mEq/L (ref 96–112)
GFR: 124.23 mL/min (ref >60.00)
Glucose, Bld: 81 mg/dL (ref 70–99)
POTASSIUM: 3.8 meq/L (ref 3.5–5.1)
Sodium: 139 mEq/L (ref 135–145)

## 2013-12-17 ENCOUNTER — Encounter (HOSPITAL_COMMUNITY): Payer: Self-pay | Admitting: Pharmacy Technician

## 2013-12-17 LAB — PATHOLOGIST SMEAR REVIEW

## 2013-12-19 MED ORDER — CEFAZOLIN SODIUM-DEXTROSE 2-3 GM-% IV SOLR
2.0000 g | INTRAVENOUS | Status: AC
  Administered 2013-12-20: 2 g via INTRAVENOUS
  Filled 2013-12-19: qty 50

## 2013-12-19 MED ORDER — CHLORHEXIDINE GLUCONATE 4 % EX LIQD
60.0000 mL | Freq: Once | CUTANEOUS | Status: DC
  Filled 2013-12-19: qty 60

## 2013-12-19 MED ORDER — SODIUM CHLORIDE 0.9 % IR SOLN
80.0000 mg | Status: DC
  Filled 2013-12-19: qty 2

## 2013-12-20 ENCOUNTER — Encounter (HOSPITAL_COMMUNITY): Payer: Medicaid Other | Admitting: Anesthesiology

## 2013-12-20 ENCOUNTER — Ambulatory Visit (HOSPITAL_COMMUNITY): Payer: Medicaid Other | Admitting: Anesthesiology

## 2013-12-20 ENCOUNTER — Encounter (HOSPITAL_COMMUNITY): Payer: Self-pay | Admitting: Anesthesiology

## 2013-12-20 ENCOUNTER — Encounter (HOSPITAL_COMMUNITY)
Admission: RE | Disposition: A | Discharge status: Home / Self Care | Payer: Self-pay | Source: Ambulatory Visit | Attending: Internal Medicine

## 2013-12-20 ENCOUNTER — Ambulatory Visit: Payer: Self-pay | Admitting: Internal Medicine

## 2013-12-20 ENCOUNTER — Inpatient Hospital Stay (HOSPITAL_COMMUNITY)
Admission: RE | Admit: 2013-12-20 | Discharge: 2013-12-22 | DRG: 227 | Disposition: A | Discharge status: Home / Self Care | Payer: Medicaid Other | Source: Ambulatory Visit | Attending: Internal Medicine | Admitting: Internal Medicine

## 2013-12-20 DIAGNOSIS — I472 Ventricular tachycardia, unspecified: Secondary | ICD-10-CM | POA: Diagnosis present

## 2013-12-20 DIAGNOSIS — I4581 Long QT syndrome: Secondary | ICD-10-CM

## 2013-12-20 DIAGNOSIS — I4729 Other ventricular tachycardia: Secondary | ICD-10-CM | POA: Diagnosis present

## 2013-12-20 DIAGNOSIS — R55 Syncope and collapse: Secondary | ICD-10-CM | POA: Diagnosis present

## 2013-12-20 HISTORY — PX: IMPLANTABLE CARDIOVERTER DEFIBRILLATOR IMPLANT: SHX5860

## 2013-12-20 HISTORY — PX: IMPLANTABLE CARDIOVERTER DEFIBRILLATOR IMPLANT: SHX5473

## 2013-12-20 LAB — SURGICAL PCR SCREEN
MRSA, PCR: NEGATIVE
STAPHYLOCOCCUS AUREUS: POSITIVE — AB

## 2013-12-20 LAB — PREGNANCY, URINE: Preg Test, Ur: NEGATIVE

## 2013-12-20 SURGERY — IMPLANTABLE CARDIOVERTER DEFIBRILLATOR IMPLANT
Anesthesia: General

## 2013-12-20 MED ORDER — PROPRANOLOL HCL 40 MG PO TABS
40.0000 mg | ORAL_TABLET | Freq: Two times a day (BID) | ORAL | Status: DC
  Administered 2013-12-20 – 2013-12-22 (×5): 40 mg via ORAL
  Filled 2013-12-20 (×6): qty 1

## 2013-12-20 MED ORDER — LACTATED RINGERS IV SOLN
INTRAVENOUS | Status: DC | PRN
  Administered 2013-12-20 (×2): via INTRAVENOUS

## 2013-12-20 MED ORDER — FENTANYL CITRATE 0.05 MG/ML IJ SOLN
25.0000 ug | INTRAMUSCULAR | Status: DC | PRN
  Administered 2013-12-20 – 2013-12-21 (×9): 50 ug via INTRAVENOUS
  Administered 2013-12-21: 25 ug via INTRAVENOUS
  Filled 2013-12-20 (×10): qty 2

## 2013-12-20 MED ORDER — ROCURONIUM BROMIDE 100 MG/10ML IV SOLN
INTRAVENOUS | Status: DC | PRN
  Administered 2013-12-20: 10 mg via INTRAVENOUS
  Administered 2013-12-20: 50 mg via INTRAVENOUS

## 2013-12-20 MED ORDER — MUPIROCIN 2 % EX OINT
TOPICAL_OINTMENT | Freq: Two times a day (BID) | CUTANEOUS | Status: DC
  Administered 2013-12-20 – 2013-12-21 (×2): via NASAL
  Filled 2013-12-20 (×2): qty 22

## 2013-12-20 MED ORDER — PROPOFOL 10 MG/ML IV BOLUS
INTRAVENOUS | Status: DC | PRN
  Administered 2013-12-20 (×2): 20 mg via INTRAVENOUS
  Administered 2013-12-20: 160 mg via INTRAVENOUS

## 2013-12-20 MED ORDER — DEXTROSE 5 % IV SOLN
INTRAVENOUS | Status: DC | PRN
  Administered 2013-12-20: 09:00:00 via INTRAVENOUS

## 2013-12-20 MED ORDER — ACETAMINOPHEN 325 MG PO TABS
325.0000 mg | ORAL_TABLET | ORAL | Status: DC | PRN
Start: 2013-12-20 — End: 2013-12-22

## 2013-12-20 MED ORDER — FENTANYL CITRATE 0.05 MG/ML IJ SOLN
INTRAMUSCULAR | Status: DC | PRN
  Administered 2013-12-20 (×6): 25 ug via INTRAVENOUS
  Administered 2013-12-20: 50 ug via INTRAVENOUS

## 2013-12-20 MED ORDER — IBUPROFEN 600 MG PO TABS
600.0000 mg | ORAL_TABLET | Freq: Four times a day (QID) | ORAL | Status: DC | PRN
  Administered 2013-12-21 – 2013-12-22 (×2): 600 mg via ORAL
  Filled 2013-12-20 (×2): qty 1

## 2013-12-20 MED ORDER — HEPARIN (PORCINE) IN NACL 2-0.9 UNIT/ML-% IJ SOLN
INTRAMUSCULAR | Status: AC
  Filled 2013-12-20: qty 500

## 2013-12-20 MED ORDER — ONDANSETRON HCL 4 MG/2ML IJ SOLN: 4.0000 mg | Freq: Four times a day (QID) | INTRAMUSCULAR | Status: DC | PRN

## 2013-12-20 MED ORDER — HYDROMORPHONE HCL PF 1 MG/ML IJ SOLN: 0.2500 mg | INTRAMUSCULAR | Status: DC | PRN

## 2013-12-20 MED ORDER — ONDANSETRON HCL 4 MG/2ML IJ SOLN
INTRAMUSCULAR | Status: DC | PRN
  Administered 2013-12-20: 4 mg via INTRAVENOUS

## 2013-12-20 MED ORDER — OXYCODONE HCL 5 MG/5ML PO SOLN: 5.0000 mg | Freq: Once | ORAL | Status: DC | PRN

## 2013-12-20 MED ORDER — OXYCODONE HCL 5 MG PO TABS: 5.0000 mg | ORAL_TABLET | Freq: Once | ORAL | Status: DC | PRN

## 2013-12-20 MED ORDER — PROMETHAZINE HCL 25 MG/ML IJ SOLN: 6.2500 mg | INTRAMUSCULAR | Status: DC | PRN

## 2013-12-20 MED ORDER — GLYCOPYRROLATE 0.2 MG/ML IJ SOLN
INTRAMUSCULAR | Status: DC | PRN
  Administered 2013-12-20: 0.2 mg via INTRAVENOUS
  Administered 2013-12-20: 0.4 mg via INTRAVENOUS

## 2013-12-20 MED ORDER — NEOSTIGMINE METHYLSULFATE 10 MG/10ML IV SOLN
INTRAVENOUS | Status: DC | PRN
  Administered 2013-12-20: 3 mg via INTRAVENOUS
  Administered 2013-12-20: 1 mg via INTRAVENOUS

## 2013-12-20 MED ORDER — LIDOCAINE HCL (PF) 1 % IJ SOLN
INTRAMUSCULAR | Status: AC
  Filled 2013-12-20: qty 30

## 2013-12-20 MED ORDER — CEFAZOLIN SODIUM 1-5 GM-% IV SOLN
1.0000 g | Freq: Four times a day (QID) | INTRAVENOUS | Status: AC
  Administered 2013-12-20 – 2013-12-21 (×3): 1 g via INTRAVENOUS
  Filled 2013-12-20 (×3): qty 50

## 2013-12-20 MED ORDER — ACETAMINOPHEN-CODEINE #3 300-30 MG PO TABS
1.0000 | ORAL_TABLET | ORAL | Status: DC | PRN
  Administered 2013-12-20 – 2013-12-21 (×2): 2 via ORAL
  Filled 2013-12-20 (×2): qty 2

## 2013-12-20 MED ORDER — MIDAZOLAM HCL 5 MG/5ML IJ SOLN
INTRAMUSCULAR | Status: DC | PRN
  Administered 2013-12-20 (×2): 0.5 mg via INTRAVENOUS

## 2013-12-20 MED ORDER — SODIUM CHLORIDE 0.9 % IV SOLN
INTRAVENOUS | Status: AC
  Administered 2013-12-20: 13:00:00 via INTRAVENOUS

## 2013-12-20 MED ORDER — MUPIROCIN 2 % EX OINT
TOPICAL_OINTMENT | CUTANEOUS | Status: AC
  Administered 2013-12-20: 1
  Filled 2013-12-20: qty 22

## 2013-12-20 MED ORDER — SODIUM CHLORIDE 0.9 % IV SOLN
INTRAVENOUS | Status: DC
  Administered 2013-12-20: 08:00:00 via INTRAVENOUS

## 2013-12-20 NOTE — CV Procedure (Signed)
Courtney Rice [id number]  [id number]  Preop NG:EXBMx:long qt syncope VF Postop Dx same/   Procedure: SubQICD  Cx: None   Dictation number [id number]  Duke SalviaSteven C Klein, MD 12/20/2013 11:03 AM

## 2013-12-20 NOTE — Discharge Summary (Signed)
ELECTROPHYSIOLOGY PROCEDURE DISCHARGE SUMMARY    Patient ID: Courtney Rice,  MRN: [id number], DOB/AGE: 22/10/1990 22 y.o.  Admit date: 12/20/2013 Discharge date: 12/22/2013  Primary Care Physician: Kathreen CosierMARSHALL,BERNARD A, MD Electrophysiologist: Ladona Ridgelaylor  Primary Discharge Diagnosis:  Long QT syndrome and syncope status post ICD implantation this admission   No Known Allergies   Procedures This Admission:  1.  Implantation of a BSX SubQ ICD on 12-20-2013 by Dr Graciela HusbandsKlein. See op note for full details.  There were no immediate post procedure complications. 2.  CXR on 12-21-2013 demonstrated no pneumothorax status post device implantation.   Brief HPI: Courtney Rice is a 23 y.o. female with a longstanding history of long QT syndrome.  She also has a family history of long QT and sudden death.  She was on beta blocker therapy and experienced several syncopal spells and NSVT on telemetry.  ICD implantation was recommended for secondary prevention of sudden cardiac death. Risks, benefits, and alternatives to ICD implantation were reviewed with the patient who wished to proceed.   Hospital Course:  The patient was admitted and underwent implantation of a [employer] SubQ ICD with details as outlined above.   She was monitored on telemetry overnight which demonstrated sinus rhythm.  Left chest was without hematoma or ecchymosis.  The device was interrogated and found to be functioning normally.  CXR was obtained and demonstrated no pneumothorax status post device implantation.  Due to wound pain, she remained an additional 24 hours for pain management.  Wound care, arm mobility, and restrictions were reviewed with the patient.  Dr Johney FrameAllred examined the patient and considered them stable for discharge to home.   The patient's discharge medications include beta blocker (Propranolol).  There is no indication for ACE-I with normal LV function.   Discharge Vitals: Blood pressure 112/65, pulse  73, temperature 98.6 F (37 C), temperature source Oral, resp. rate 18, height 5\' 4"  (1.626 m), weight 130 lb 8.2 oz (59.2 kg), SpO2 98.00%, unknown if currently breastfeeding.   Physical Exam: Filed Vitals:   12/21/13 1417 12/21/13 2029 12/22/13 0543 12/22/13 0956  BP: 125/77 114/70 112/65   Pulse: 51 69 56 73  Temp: 97.7 F (36.5 C) 97.4 F (36.3 C) 98.6 F (37 C)   TempSrc: Oral Oral Oral   Resp: 16 18 18    Height:      Weight:      SpO2: 96% 98% 98%     GEN- The patient is well appearing, alert and oriented x 3 today.   Head- normocephalic, atraumatic Eyes-  Sclera clear, conjunctiva pink Ears- hearing intact Oropharynx- clear Neck- supple, no JVP Lymph- no cervical lymphadenopathy Lungs- Clear to ausculation bilaterally, normal work of breathing Heart- Regular rate and rhythm, no murmurs, rubs or gallops, PMI not laterally displaced GI- soft, NT, ND, + BS Extremities- no clubbing, cyanosis, or edema MS- no significant deformity or atrophy Skin- incision site looks good, without hematoma/ bleeding but is very tender  Labs:   Lab Results  Component Value Date   WBC 4.3 12/14/2013   HGB 13.2 12/14/2013   HCT 40.0 12/14/2013   MCV 85.2 12/14/2013   PLT 226.0 12/14/2013   No results found for this basename: NA, K, CL, CO2, BUN, CREATININE, CALCIUM, LABALBU, PROT, BILITOT, ALKPHOS, ALT, AST, GLUCOSE,  in the last 168 hours   Discharge Medications:    Medication List         ibuprofen 600 MG tablet  Commonly known as:  ADVIL,MOTRIN  Take 600 mg by mouth every 6 (six) hours as needed for mild pain.     oxyCODONE-acetaminophen 5-325 MG per tablet  Commonly known as:  PERCOCET/ROXICET  Take 1-2 tablets by mouth every 6 (six) hours as needed for severe pain.     propranolol 40 MG tablet  Commonly known as:  INDERAL  Take 40 mg by mouth 2 (two) times daily.        Disposition:   Follow-up Information   Follow up with Sherryl MangesSteven Klein, MD On 03/21/2014. (8:15 AM)     Specialty:  Cardiology   Contact information:   [address] [place] [address] [phone]       [facility] On 12/31/2013. (2:00 PM.)    Contact information:   [address] [place] [address] [phone]      Duration of Discharge Encounter: Greater than 30 minutes including physician time.  Signed,  Hillis RangeJames Haevyn Ury MD

## 2013-12-20 NOTE — Interval H&P Note (Signed)
ICD Criteria  Current LVEF:55% ;Obtained < 1 month ago.  NYHA Functional Classification: Class I  Heart Failure History:  No.  Non-Ischemic Dilated Cardiomyopathy History:  No.  Atrial Fibrillation/Atrial Flutter:  No.  Ventricular Tachycardia History:  Yes, Hemodynamic instability present, VT Type:  SVT - Polymorphic.  Cardiac Arrest History:  No  History of Syndromes with Risk of Sudden Death:  Yes, Long QT Syndrome  Previous ICD:  No.  Electrophysiology Study: No.  Prior MI: No.  PPM: No.  OSA:  No  Patient Life Expectancy of >=1 year: Yes.  Anticoagulation Therapy:  Patient is NOT on anticoagulation therapy.   Beta Blocker Therapy:  Yes.   Ace Inhibitor/ARB Therapy:  No, Reason not on Ace Inhibitor/ARB therapy:  not indicatedHistory and Physical Interval Note:  12/20/2013 7:11 AM  Courtney Rice  has presented today for surgery, with the diagnosis of cm  The various methods of treatment have been discussed with the patient and family. After consideration of risks, benefits and other options for treatment, the patient has consented to  Procedure(s): SUB Q ICD (N/A) as a surgical intervention .  The patient's history has been reviewed, patient examined, no change in status, stable for surgery.  I have reviewed the patient's chart and labs.  Questions were answered to the patient's satisfaction.     Duke SalviaSteven C Klein

## 2013-12-20 NOTE — Transfer of Care (Signed)
Immediate Anesthesia Transfer of Care Note  Patient: Courtney Rice  Procedure(s) Performed: Procedure(s): SUB Q ICD (N/A)  Patient Location: PACU (CathLab)   Anesthesia Type:General  Level of Consciousness: awake and patient cooperative  Airway & Oxygen Therapy: Patient Spontanous Breathing and Patient connected to nasal cannula oxygen  Post-op Assessment: Report given to PACU RN, Post -op Vital signs reviewed and stable and Patient moving all extremities  Post vital signs: Reviewed and stable  Complications: No apparent anesthesia complications

## 2013-12-20 NOTE — Anesthesia Preprocedure Evaluation (Addendum)
Anesthesia Evaluation  Patient identified by MRN, date of birth, ID band Patient awake    Reviewed: Allergy & Precautions, H&P , NPO status , Patient's Chart, lab work & pertinent test results, reviewed documented beta blocker date and time   Airway Mallampati: II TM Distance: >3 FB Neck ROM: Full    Dental  (+) Teeth Intact, Dental Advisory Given   Pulmonary neg pulmonary ROS,    Pulmonary exam normal       Cardiovascular Ventricular Tachycardia + pacemaker     Neuro/Psych negative neurological ROS  negative psych ROS   GI/Hepatic negative GI ROS, Neg liver ROS,   Endo/Other  negative endocrine ROS  Renal/GU negative Renal ROS     Musculoskeletal   Abdominal   Peds  Hematology   Anesthesia Other Findings Pt seven weeks post-partum.  Denies taking any meds this morning.  Reproductive/Obstetrics negative OB ROS                         Anesthesia Physical Anesthesia Plan  ASA: II  Anesthesia Plan: General   Post-op Pain Management:    Induction: Intravenous  Airway Management Planned: LMA and Oral ETT  Additional Equipment:   Intra-op Plan:   Post-operative Plan: Extubation in OR  Informed Consent: I have reviewed the patients History and Physical, chart, labs and discussed the procedure including the risks, benefits and alternatives for the proposed anesthesia with the patient or authorized representative who has indicated his/her understanding and acceptance.   Dental advisory given  Plan Discussed with: CRNA, Anesthesiologist and Surgeon  Anesthesia Plan Comments:        Anesthesia Quick Evaluation

## 2013-12-20 NOTE — Anesthesia Postprocedure Evaluation (Signed)
  Anesthesia Post-op Note  Patient: Courtney Rice  Procedure(s) Performed: Procedure(s): SUB Q ICD (N/A)  Patient Location: PACU  Anesthesia Type:General  Level of Consciousness: awake, alert , oriented and patient cooperative  Airway and Oxygen Therapy: Patient Spontanous Breathing  Post-op Pain: moderate  Post-op Assessment: Post-op Vital signs reviewed, Patient's Cardiovascular Status Stable, Respiratory Function Stable, Patent Airway and No signs of Nausea or vomiting  Post-op Vital Signs: stable  Last Vitals:  Filed Vitals:   12/20/13 0708  BP: 125/83  Pulse: 70  Temp: 36.7 C  Resp: 20    Complications: No apparent anesthesia complications

## 2013-12-20 NOTE — Addendum Note (Signed)
Addendum created 12/20/13 1405 by Marni GriffonKaren B Indie Boehne, CRNA   Modules edited: Anesthesia Medication Administration

## 2013-12-20 NOTE — Op Note (Signed)
NAMYolanda Rice:  Courtney Rice, Courtney Rice            ACCOUNT NO.:  [id number]  MEDICAL RECORD NO.:  [id number]  LOCATION:  [id number]                        FACILITY:  [facility]  PHYSICIAN:  Duke SalviaSteven C. Charmaine Placido, MD, FACCDATE OF BIRTH:  02-13-1991  DATE OF PROCEDURE:  12/20/2013 DATE OF DISCHARGE:                              OPERATIVE REPORT   PREOPERATIVE DIAGNOSIS:  Long QT syndrome with syncope and polymorphic ventricular tachycardia.  POSTOPERATIVE DIAGNOSIS:  Long QT syndrome with syncope and polymorphic ventricular tachycardia.  PROCEDURE:  Subcutaneous ICD implantation with intraoperative defibrillation threshold testing.  Following obtaining informed consent, the patient was brought to the electrophysiology laboratory and placed on the fluoroscopic table in supine position.  After routine prep and drape and radiographic mapping, anesthesia was delivered along the inframammary groove, the subxiphoid sternal area, and the suprasternal notch.  Three separate incisions were made.  The first was an inframammary incision which allowed for the formation of a pocket for the subcutaneous ICD.  The next incision that was made was over the inferior sternum, and a third incision was made at the suprasternal notch.  Anchoring sutures were placed at the infrasternal fascia and following tunneling the [facility] lead model 3010A [id number] was moved from the lateral incision to the inferior sternal incision.  The anchoring suture was secured to the lead and then the anchoring suture was secured to the fascia.  We then mapped the location of the suprasternal incision and a 0.5 inch incision was made at that location.  The lead was tunneled up and secured using a 4-0 Monocryl suture.  These 2 incisions were closed in the first layer.  The lead was then attached to a [facility] defibrillator model 1010A [phone].  The pocket was copiously irrigated with antibiotic containing saline solution.  Hemostasis was  assured. Surgicel was placed in the pocket on the anterior and posterior aspects of the device and the device was then secured at the inferior and superior aspect of the pocket.  This was chosen so as to allow the anchoring suture to be posterior __________ as she was so thin.  The pocket was then closed through its initial first later.  Defibrillation threshold testing was undertaken.  It took numerous attempts to induce ventricular fibrillation that was sustained. Following that we finally had an episode of 17.8 seconds, following which a 65 joule shock was delivered through a measured resistance of 48 ohms terminating ventricular fibrillation and restoring sinus rhythm. At this point, the 3 incisions were closed and a Dermabond dressing was applied.  The patient tolerated the procedure without apparent complication.  The device is programmed as a single zone device at 240 beats per minute.     Duke SalviaSteven C. Mima Cranmore, MD, [facility]ACC     SCK/MEDQ  D:  12/20/2013  T:  12/20/2013  Job:  [id number]

## 2013-12-20 NOTE — Progress Notes (Signed)
UR Completed Kelon Easom Graves-Bigelow, RN,BSN [phone]  

## 2013-12-20 NOTE — H&P (View-Only) (Signed)
      Patient Care Team: Bernard A Marshall, MD as PCP - General (Obstetrics and Gynecology)   HPI  Courtney Rice is a 23 y.o. female With long QT postpartum syncope and polymorphic ventricular tachycardia. She is currently on a LifeVest. Anticipate subcutaneous ICD implantation. He's had no recurrent syncope.  Past Medical History  Diagnosis Date  . Long Q-T syndrome   . Long Q-T syndrome 06/06/2013    Past Surgical History  Procedure Laterality Date  . Tonsillectomy      Current Outpatient Prescriptions  Medication Sig Dispense Refill  . ibuprofen (ADVIL,MOTRIN) 600 MG tablet Take 1 tablet (600 mg total) by mouth every 6 (six) hours as needed.  30 tablet  5  . propranolol (INDERAL) 40 MG tablet Take 1 tablet (40 mg total) by mouth 2 (two) times daily.  60 tablet  11   No current facility-administered medications for this visit.    No Known Allergies  Review of Systems negative except from HPI and PMH  Physical Exam BP 119/75  Pulse 76  Ht 5' 4" (1.626 m)  Wt 125 lb (56.7 kg)  BMI 21.45 kg/m2  Well developed and nourished in no acute distress HENT normal Neck supple with JVP-flat Clear Regular rate and rhythm, no murmurs or gallops Abd-soft with active BS No Clubbing cyanosis edema Skin-warm and dry A & Oriented  Grossly normal sensory and motor function  EKG demonstrates sinus rhythm at 76 intervals 15/07/43 with a QTC of 490. There is one PVC  Assessment and  Plan  Long Q-T syndrome-2  Polymorphic ventricular tachycardia  Postpartum syncope  Plan to undertake subcutaneous ICD implantation. We'll also be looking into support groups for young lady's with a sense of diagnoses.    

## 2013-12-21 ENCOUNTER — Ambulatory Visit (HOSPITAL_COMMUNITY): Payer: Medicaid Other

## 2013-12-21 DIAGNOSIS — I4729 Other ventricular tachycardia: Secondary | ICD-10-CM

## 2013-12-21 DIAGNOSIS — R55 Syncope and collapse: Secondary | ICD-10-CM

## 2013-12-21 DIAGNOSIS — I4581 Long QT syndrome: Principal | ICD-10-CM

## 2013-12-21 DIAGNOSIS — I472 Ventricular tachycardia: Secondary | ICD-10-CM

## 2013-12-21 MED ORDER — OXYCODONE-ACETAMINOPHEN 5-325 MG PO TABS
2.0000 | ORAL_TABLET | ORAL | Status: DC | PRN
  Administered 2013-12-21 – 2013-12-22 (×7): 2 via ORAL
  Filled 2013-12-21 (×7): qty 2

## 2013-12-21 NOTE — Progress Notes (Signed)
   SUBJECTIVE: The patient is status post SubQ ICD implant 12-20-13.  She is in significant pain this morning and is unable to stand.  She has been receiving Fentanyl every 2 hours through the night.   CURRENT MEDICATIONS: . mupirocin ointment   Nasal BID  . propranolol  40 mg Oral BID      OBJECTIVE: Physical Exam: Filed Vitals:   12/20/13 1511 12/20/13 2050 12/20/13 2057 12/21/13 0425  BP: 135/97 117/74  117/73  Pulse: 62 56 63 50  Temp: 97.3 F (36.3 C) 97.6 F (36.4 C)  98.1 F (36.7 C)  TempSrc: Oral Oral  Oral  Resp: 19 18  18   Height:      Weight:    130 lb 8.2 oz (59.2 kg)  SpO2: 100% 100%  97%    Intake/Output Summary (Last 24 hours) at 12/21/13 9678 Last data filed at 12/20/13 1515  Gross per 24 hour  Intake   1650 ml  Output   1220 ml  Net    430 ml    Telemetry reveals sinus bradycardia  GEN- The patient is well appearing, alert and oriented x 3 today.   Head- normocephalic, atraumatic Eyes-  Sclera clear, conjunctiva pink Ears- hearing intact Oropharynx- clear Neck- supple, no JVP Lymph- no cervical lymphadenopathy Lungs- Clear to ausculation bilaterally, normal work of breathing Heart- Regular rate and rhythm, no murmurs, rubs or gallops, PMI not laterally displaced GI- soft, NT, ND, + BS Extremities- no clubbing, cyanosis, or edema Device site looks good Neuro- strength and sensation are intact   RADIOLOGY: Stable device, no ptx  ASSESSMENT AND PLAN:  Active Problems:   Long Q-T syndrome  Doing well s/p ICD implant She has a fair bit of local discomfort and requires significant pain medicine.  She does not feel that she can go home today.  Discharge is therefore deferred until tomorrow am.

## 2013-12-22 ENCOUNTER — Encounter (HOSPITAL_COMMUNITY): Payer: Self-pay | Admitting: Nurse Practitioner

## 2013-12-22 MED ORDER — OXYCODONE-ACETAMINOPHEN 5-325 MG PO TABS: 1.0000 | ORAL_TABLET | Freq: Four times a day (QID) | ORAL | Status: DC | PRN

## 2013-12-25 ENCOUNTER — Encounter: Payer: Self-pay | Admitting: Internal Medicine

## 2013-12-25 ENCOUNTER — Ambulatory Visit: Payer: Self-pay | Admitting: Internal Medicine

## 2013-12-28 ENCOUNTER — Telehealth: Payer: Self-pay | Admitting: Internal Medicine

## 2013-12-28 NOTE — Telephone Encounter (Signed)
lmtcb

## 2013-12-28 NOTE — Telephone Encounter (Signed)
New message ° ° ° ° °Returned a nurses call °

## 2013-12-31 ENCOUNTER — Ambulatory Visit (INDEPENDENT_AMBULATORY_CARE_PROVIDER_SITE_OTHER): Payer: Medicaid Other | Admitting: *Deleted

## 2013-12-31 DIAGNOSIS — I472 Ventricular tachycardia: Secondary | ICD-10-CM

## 2013-12-31 DIAGNOSIS — I4729 Other ventricular tachycardia: Secondary | ICD-10-CM

## 2013-12-31 DIAGNOSIS — I4581 Long QT syndrome: Secondary | ICD-10-CM

## 2013-12-31 LAB — ICD DEVICE OBSERVATION

## 2013-12-31 LAB — MDC_IDC_ENUM_SESS_TYPE_INCLINIC: Implantable Pulse Generator Model: 1010

## 2013-12-31 NOTE — Progress Notes (Signed)
Wound check appointment for S-ICD. Wound without redness or edema. Incision edges approximated, wound well healed. Normal device function. Thresholds, sensing, and impedances consistent with implant measurements. Device programmed at 3.5V for extra safety margin until 3 month visit. Histogram distribution appropriate for patient and level of activity. No ventricular arrhythmias noted. Patient educated about wound care, arm mobility, lifting restrictions, shock plan. ROV 01-23-14 @ 1400 with GT.

## 2013-12-31 NOTE — Telephone Encounter (Signed)
Seen in device clinic on 6/1.

## 2014-01-04 ENCOUNTER — Other Ambulatory Visit: Payer: Self-pay

## 2014-01-08 ENCOUNTER — Encounter: Payer: Self-pay | Admitting: Internal Medicine

## 2014-01-11 ENCOUNTER — Ambulatory Visit (HOSPITAL_COMMUNITY): Admit: 2014-01-11 | Payer: Self-pay | Admitting: Internal Medicine

## 2014-01-11 ENCOUNTER — Encounter (HOSPITAL_COMMUNITY): Payer: Self-pay

## 2014-01-11 SURGERY — IMPLANTABLE CARDIOVERTER DEFIBRILLATOR IMPLANT
Anesthesia: LOCAL

## 2014-01-21 ENCOUNTER — Ambulatory Visit: Payer: Self-pay

## 2014-01-23 ENCOUNTER — Encounter (INDEPENDENT_AMBULATORY_CARE_PROVIDER_SITE_OTHER): Payer: Self-pay

## 2014-01-23 ENCOUNTER — Encounter: Payer: Self-pay | Admitting: Internal Medicine

## 2014-01-23 ENCOUNTER — Ambulatory Visit (INDEPENDENT_AMBULATORY_CARE_PROVIDER_SITE_OTHER): Payer: Medicaid Other | Admitting: Internal Medicine

## 2014-01-23 VITALS — BP 125/73 | HR 77 | Ht 64.0 in | Wt 123.0 lb

## 2014-01-23 DIAGNOSIS — I4581 Long QT syndrome: Secondary | ICD-10-CM | POA: Diagnosis not present

## 2014-01-23 DIAGNOSIS — R404 Transient alteration of awareness: Secondary | ICD-10-CM | POA: Insufficient documentation

## 2014-01-23 NOTE — Assessment & Plan Note (Signed)
The etiology of her symptoms is unclear. There is a remote history of seizures though what she has described does not seem like a seizure. If her symptoms continue, then I would suggest neuro evaluation with EEG.

## 2014-01-23 NOTE — Progress Notes (Signed)
      HPI Courtney Rice returns today for followup. She is a pleasant 23 yo woman with LQTS 2, and syncope and NSVT around the time of her childs birth. She underwent insertion of a subcutaneous ICD several weeks ago. She has not had any ICD shocks. She describes symptoms where her arms will spontaneously move without her control. She also relates an episode where she could not talk, resolving in a few minutes. No chest pain or sob. She had incisional soreness which is mostly resolved. No Known Allergies   Current Outpatient Prescriptions  Medication Sig Dispense Refill  . ibuprofen (ADVIL,MOTRIN) 600 MG tablet Take 600 mg by mouth every 6 (six) hours as needed for mild pain.      Marland Kitchen. propranolol (INDERAL) 40 MG tablet Take 40 mg by mouth 2 (two) times daily.       No current facility-administered medications for this visit.     Past Medical History  Diagnosis Date  . Long Q-T syndrome Type 2     a. post partum syncope and VT PM;  b. 11/2013 s/p BSX SubQ ICD placement.    ROS:   All systems reviewed and negative except as noted in the HPI.   Past Surgical History  Procedure Laterality Date  . Tonsillectomy    . Implantable cardioverter defibrillator implant  12-20-2013    BSX SubQ ICD implanted by Dr Graciela HusbandsKlein     Family History  Problem Relation Age of Onset  . Heart disease Mother   . Diabetes Mother   . Asthma Mother   . COPD Mother   . Lupus Mother   . Stroke Mother   . COPD Maternal Grandmother   . Heart disease Maternal Grandmother   . Hearing loss Neg Hx      History   Social History  . Marital Status: Single    Spouse Name: N/A    Number of Children: N/A  . Years of Education: N/A   Occupational History  . Not on file.   Social History Main Topics  . Smoking status: Never Smoker   . Smokeless tobacco: Never Used  . Alcohol Use: No  . Drug Use: No  . Sexual Activity: No     Comment: post partum   Other Topics Concern  . Not on file   Social History  Narrative  . No narrative on file     BP 125/73  Pulse 77  Ht 5\' 4"  (1.626 m)  Wt 123 lb (55.792 kg)  BMI 21.10 kg/m2  Physical Exam:  Well appearing 23 yo woman, NAD HEENT: Unremarkable Neck:  No JVD, no thyromegally Back:  No CVA tenderness Lungs:  Clear with no wheezes HEART:  Regular rate rhythm, no murmurs, no rubs, no clicks Abd:  soft, positive bowel sounds, no organomegally, no rebound, no guarding Ext:  2 plus pulses, no edema, no cyanosis, no clubbing Skin:  No rashes no nodules Neuro:  CN II through XII intact, motor grossly intact   DEVICE  Normal device function.  See PaceArt for details.   Assess/Plan:

## 2014-01-23 NOTE — Patient Instructions (Signed)
Your physician wants you to follow-up in: 6 MONTHS WITH DR. Ladona RidgelAYLOR. You will receive a reminder letter in the mail two months in advance. If you don't receive a letter, please call our office to schedule the follow-up appointment.   NO CHANGES WERE MADE TODAY

## 2014-01-23 NOTE — Assessment & Plan Note (Signed)
She is stable on her beta blocker. Will follow. She has had no sustained ventricular arrhythmias have been seen.

## 2014-02-04 ENCOUNTER — Encounter: Payer: Self-pay | Admitting: Internal Medicine

## 2014-03-21 ENCOUNTER — Encounter: Payer: Self-pay | Admitting: Internal Medicine

## 2014-03-21 ENCOUNTER — Ambulatory Visit (INDEPENDENT_AMBULATORY_CARE_PROVIDER_SITE_OTHER): Payer: Medicaid Other | Admitting: Internal Medicine

## 2014-03-21 VITALS — BP 123/73 | HR 84 | Wt 120.0 lb

## 2014-03-21 DIAGNOSIS — I4729 Other ventricular tachycardia: Secondary | ICD-10-CM

## 2014-03-21 DIAGNOSIS — I4581 Long QT syndrome: Secondary | ICD-10-CM

## 2014-03-21 DIAGNOSIS — I472 Ventricular tachycardia: Secondary | ICD-10-CM

## 2014-03-21 DIAGNOSIS — Z95810 Presence of automatic (implantable) cardiac defibrillator: Secondary | ICD-10-CM

## 2014-03-21 MED ORDER — PROPRANOLOL HCL 60 MG PO TABS: 60.0000 mg | ORAL_TABLET | Freq: Two times a day (BID) | ORAL | Status: DC

## 2014-03-21 NOTE — Progress Notes (Signed)
      Patient Care Team: Kathreen CosierBernard A Marshall, MD as PCP - General (Obstetrics and Gynecology)   HPI  Courtney Rice is a 23 y.o. female Seen in followup for long QT syndrome that was noted postpartum associate of polymorphic ventricular tachycardia. She underwent ICD implantation. She delivered her son 3/15. 5 she had a recurrent episode of nonsustained polymorphic ventricular tachycardia.  Past Medical History  Diagnosis Date  . Long Q-T syndrome Type 2     a. post partum syncope and VT PM;  b. 11/2013 s/p BSX SubQ ICD placement.    Past Surgical History  Procedure Laterality Date  . Tonsillectomy    . Implantable cardioverter defibrillator implant  12-20-2013    BSX SubQ ICD implanted by Dr Graciela HusbandsKlein    Current Outpatient Prescriptions  Medication Sig Dispense Refill  . propranolol (INDERAL) 40 MG tablet Take 40 mg by mouth 2 (two) times daily.       No current facility-administered medications for this visit.    No Known Allergies  Review of Systems negative except from HPI and PMH  Physical Exam BP 123/73  Pulse 84  Wt 120 lb (54.432 kg) Well developed and well nourished in no acute distress HENT normal E scleral and icterus clear Neck Supple JVP flat; carotids brisk and full Clear to ausculation Device pocket well healed; without hematoma or erythema.  There is no tethering Regular rate and rhythm, no murmurs gallops or rub Soft with active bowel sounds No clubbing cyanosis  Edema Alert and oriented, grossly normal motor and sensory function Skin Warm and Dry    Assessment and  Plan  VT-PM  Recurrent non sustained  ICD-S  The patient's device was interrogated.  The information was reviewed. No changes were made in the programming.    LQTS2  Will increase her inderal 40--> 60 bid   followup 3 months Will followup with Courtney Rice re testing for son

## 2014-03-21 NOTE — Patient Instructions (Addendum)
Your physician has recommended you make the following change in your medication:  1) INCREASE Propanolol to 60 twice daily  Your physician wants you to follow-up in: 3 months with device Dr. Graciela HusbandsKlein.  You will receive a reminder letter in the mail two months in advance. If you don't receive a letter, please call our office to schedule the follow-up appointment.

## 2014-03-27 ENCOUNTER — Encounter: Payer: Self-pay | Admitting: Internal Medicine

## 2014-06-03 ENCOUNTER — Encounter: Payer: Self-pay | Admitting: Internal Medicine

## 2014-07-03 ENCOUNTER — Encounter: Payer: Self-pay | Admitting: Internal Medicine

## 2014-07-03 ENCOUNTER — Ambulatory Visit (INDEPENDENT_AMBULATORY_CARE_PROVIDER_SITE_OTHER): Payer: Medicaid Other | Admitting: Internal Medicine

## 2014-07-03 VITALS — BP 122/88 | HR 61 | Ht 64.0 in | Wt 120.0 lb

## 2014-07-03 DIAGNOSIS — I472 Ventricular tachycardia: Secondary | ICD-10-CM

## 2014-07-03 DIAGNOSIS — Z4502 Encounter for adjustment and management of automatic implantable cardiac defibrillator: Secondary | ICD-10-CM

## 2014-07-03 DIAGNOSIS — I4581 Long QT syndrome: Secondary | ICD-10-CM

## 2014-07-03 DIAGNOSIS — I4729 Other ventricular tachycardia: Secondary | ICD-10-CM

## 2014-07-03 NOTE — Progress Notes (Signed)
      Patient Care Team: Kathreen CosierBernard A Marshall, MD as PCP - General (Obstetrics and Gynecology)   HPI  Courtney Rice is a 23 y.o. female Seen in followup for long QT syndrome that was noted postpartum associate of polymorphic ventricular tachycardia. She underwent ICD implantation. She delivered her son 3/15. 5 she had a recurrent episode of nonsustained polymorphic ventricular tachycardia.  Past Medical History  Diagnosis Date  . Long Q-T syndrome Type 2     a. post partum syncope and VT PM;  b. 11/2013 s/p BSX SubQ ICD placement.    Past Surgical History  Procedure Laterality Date  . Tonsillectomy    . Implantable cardioverter defibrillator implant  12-20-2013    BSX SubQ ICD implanted by Dr Graciela HusbandsKlein    Current Outpatient Prescriptions  Medication Sig Dispense Refill  . propranolol (INDERAL) 60 MG tablet Take 1 tablet (60 mg total) by mouth 2 (two) times daily. 60 tablet 1   No current facility-administered medications for this visit.    No Known Allergies  Review of Systems negative except from HPI and PMH  Physical Exam BP 122/88 mmHg  Pulse 61  Ht 5\' 4"  (1.626 m)  Wt 120 lb (54.432 kg)  BMI 20.59 kg/m2 Well developed and well nourished in no acute distress HENT normal E scleral and icterus clear Neck Supple JVP flat; carotids brisk and full Clear to ausculation Device pocket well healed; without hematoma or erythema.  There is no tethering Regular rate and rhythm, no murmurs gallops or rub Soft with active bowel sounds No clubbing cyanosis  Edema Alert and oriented, grossly normal motor and sensory function Skin Warm and Dry    Assessment and  Plan  VT-PM  Recurrent non sustained  ICD-S      LQTS2  Will increase her inderal 40--> 60 bid    Will need device interrogated  Otherwise is well and continuing on betalcokers

## 2014-07-03 NOTE — Patient Instructions (Signed)
Your physician recommends that you continue on your current medications as directed. Please refer to the Current Medication list given to you today.  Your physician recommends that you schedule a follow-up appointment on Friday 12/4 at 2:00 with device clinic for device check.  Your physician wants you to follow-up in: 6 months with device clinic.  You will receive a reminder letter in the mail two months in advance. If you don't receive a letter, please call our office to schedule the follow-up appointment.  Your physician wants you to follow-up in: 1 year with Dr. Graciela HusbandsKlein.  You will receive a reminder letter in the mail two months in advance. If you don't receive a letter, please call our office to schedule the follow-up appointment.

## 2014-07-05 ENCOUNTER — Ambulatory Visit (INDEPENDENT_AMBULATORY_CARE_PROVIDER_SITE_OTHER): Payer: Medicaid Other | Admitting: *Deleted

## 2014-07-05 DIAGNOSIS — I4729 Other ventricular tachycardia: Secondary | ICD-10-CM

## 2014-07-05 DIAGNOSIS — I472 Ventricular tachycardia: Secondary | ICD-10-CM

## 2014-07-05 DIAGNOSIS — I4581 Long QT syndrome: Secondary | ICD-10-CM

## 2014-07-11 ENCOUNTER — Encounter (HOSPITAL_COMMUNITY): Payer: Self-pay | Admitting: Internal Medicine

## 2014-07-12 LAB — MDC_IDC_ENUM_SESS_TYPE_INCLINIC: Implantable Pulse Generator Model: 1010

## 2014-07-12 NOTE — Progress Notes (Signed)
S-ICD check in office by industry. No new episodes since last follow-up. Electrode impedance status ok. No changes made. Battery life to ERI 96%. ROV w/ device clinic in 83mo & w/ SK in 74mo.

## 2014-07-31 ENCOUNTER — Encounter: Payer: Self-pay | Admitting: Internal Medicine

## 2014-08-15 ENCOUNTER — Encounter (HOSPITAL_COMMUNITY): Payer: Self-pay | Admitting: Internal Medicine

## 2014-10-07 ENCOUNTER — Ambulatory Visit (INDEPENDENT_AMBULATORY_CARE_PROVIDER_SITE_OTHER): Payer: Medicaid Other | Admitting: *Deleted

## 2014-10-07 DIAGNOSIS — I4581 Long QT syndrome: Secondary | ICD-10-CM | POA: Diagnosis not present

## 2014-10-07 LAB — MDC_IDC_ENUM_SESS_TYPE_INCLINIC: Implantable Pulse Generator Model: 1010

## 2014-10-07 NOTE — Progress Notes (Signed)
ICD check in clinic. Normal device function. Thresholds and sensing consistent with previous device measurements. Impedance trends stable over time. No evidence of any ventricular arrhythmias. No mode switches. Histogram distribution appropriate for patient and level of activity. No changes made this session. Device programmed at appropriate safety margins. Device programmed to optimize intrinsic conduction. Battery life to ERI 92%. ROV in 3 mths w/device clinic and December with SK.

## 2014-11-13 ENCOUNTER — Encounter: Payer: Self-pay | Admitting: Internal Medicine

## 2015-01-09 ENCOUNTER — Encounter (HOSPITAL_COMMUNITY): Payer: Self-pay | Admitting: Internal Medicine

## 2015-01-13 ENCOUNTER — Encounter: Payer: Self-pay | Admitting: Internal Medicine

## 2015-01-13 ENCOUNTER — Telehealth: Payer: Self-pay | Admitting: Internal Medicine

## 2015-01-16 ENCOUNTER — Ambulatory Visit (INDEPENDENT_AMBULATORY_CARE_PROVIDER_SITE_OTHER): Payer: Medicaid Other | Admitting: *Deleted

## 2015-01-16 DIAGNOSIS — Z4502 Encounter for adjustment and management of automatic implantable cardiac defibrillator: Secondary | ICD-10-CM

## 2015-01-16 DIAGNOSIS — I4581 Long QT syndrome: Secondary | ICD-10-CM

## 2015-01-16 LAB — CUP PACEART INCLINIC DEVICE CHECK
MDC IDC SESS DTM: 20160616113816
Pulse Gen Model: 1010

## 2015-01-16 MED ORDER — PROPRANOLOL HCL 60 MG PO TABS: 60.0000 mg | ORAL_TABLET | Freq: Two times a day (BID) | ORAL | Status: DC

## 2015-01-16 NOTE — Progress Notes (Signed)
Subcutaneous ICD check in clinic. 0 untreated episodes; 0 treated episodes; 0 shocks delivered. Electrode impedance status okay. No programming changes. Remaining longevity to ERI 86%. ROV device clinic in 45mo & w/ SK in 82mo.

## 2015-02-10 ENCOUNTER — Encounter: Payer: Self-pay | Admitting: Internal Medicine

## 2015-03-27 ENCOUNTER — Ambulatory Visit: Payer: Self-pay | Admitting: Neurology

## 2015-06-03 ENCOUNTER — Telehealth: Payer: Self-pay | Admitting: Internal Medicine

## 2015-06-03 NOTE — Telephone Encounter (Signed)
I spoke with the patient and made her aware we have her form and this is the first time Dr. Graciela HusbandsKlein has been in the office to review since receiving.  I will try to have him review prior to 5pm, but I can't promise that as he is seeing patients.

## 2015-06-03 NOTE — Telephone Encounter (Signed)
New problem    Pt want to know status of her Health Evaluation [facility] she brought by for Dr Graciela HusbandsKlein to fill out. Please call pt.

## 2015-06-03 NOTE — Telephone Encounter (Signed)
I left a message for the patient Dr. Graciela HusbandsKlein completed her form and I have been trying to fax this to the [facility] [phone], since 3:52 pm. 1 st attempt did not go through. I have sent through a 2 nd time- waiting for confirmation.

## 2015-06-03 NOTE — Telephone Encounter (Signed)
F/u  Pt calling back to see if documentation was filled out. Please call back and discuss.

## 2015-06-03 NOTE — Telephone Encounter (Signed)
Confirmation received. [facility]

## 2015-07-29 ENCOUNTER — Encounter: Payer: Self-pay | Admitting: *Deleted

## 2015-08-01 ENCOUNTER — Observation Stay (HOSPITAL_COMMUNITY)
Admission: EM | Admit: 2015-08-01 | Discharge: 2015-08-02 | Disposition: A | Discharge status: Home / Self Care | Payer: Medicaid Other | Attending: Cardiology | Admitting: Cardiology

## 2015-08-01 ENCOUNTER — Encounter (HOSPITAL_COMMUNITY): Payer: Self-pay | Admitting: General Practice

## 2015-08-01 ENCOUNTER — Emergency Department (HOSPITAL_COMMUNITY): Payer: Medicaid Other

## 2015-08-01 ENCOUNTER — Telehealth: Payer: Self-pay | Admitting: Internal Medicine

## 2015-08-01 DIAGNOSIS — I959 Hypotension, unspecified: Secondary | ICD-10-CM | POA: Diagnosis not present

## 2015-08-01 DIAGNOSIS — I4581 Long QT syndrome: Secondary | ICD-10-CM | POA: Insufficient documentation

## 2015-08-01 DIAGNOSIS — Z79899 Other long term (current) drug therapy: Secondary | ICD-10-CM | POA: Insufficient documentation

## 2015-08-01 DIAGNOSIS — R110 Nausea: Secondary | ICD-10-CM | POA: Insufficient documentation

## 2015-08-01 DIAGNOSIS — R42 Dizziness and giddiness: Principal | ICD-10-CM | POA: Insufficient documentation

## 2015-08-01 DIAGNOSIS — I951 Orthostatic hypotension: Secondary | ICD-10-CM

## 2015-08-01 DIAGNOSIS — R079 Chest pain, unspecified: Secondary | ICD-10-CM | POA: Diagnosis not present

## 2015-08-01 DIAGNOSIS — Z95810 Presence of automatic (implantable) cardiac defibrillator: Secondary | ICD-10-CM

## 2015-08-01 HISTORY — DX: Migraine, unspecified, not intractable, without status migrainosus: G43.909

## 2015-08-01 HISTORY — DX: Syncope and collapse: R55

## 2015-08-01 HISTORY — DX: Presence of automatic (implantable) cardiac defibrillator: Z95.810

## 2015-08-01 LAB — CBC WITH DIFFERENTIAL/PLATELET
Basophils Absolute: 0 10*3/uL (ref 0.0–0.1)
Basophils Relative: 0 %
Eosinophils Absolute: 0 10*3/uL (ref 0.0–0.7)
Eosinophils Relative: 0 %
HEMATOCRIT: 34.3 % — AB (ref 36.0–46.0)
HEMOGLOBIN: 12 g/dL (ref 12.0–15.0)
LYMPHS ABS: 2.4 10*3/uL (ref 0.7–4.0)
Lymphocytes Relative: 21 %
MCH: 31.1 pg (ref 26.0–34.0)
MCHC: 35 g/dL (ref 30.0–36.0)
MCV: 88.9 fL (ref 78.0–100.0)
MONO ABS: 1 10*3/uL (ref 0.1–1.0)
MONOS PCT: 9 %
NEUTROS ABS: 7.9 10*3/uL — AB (ref 1.7–7.7)
NEUTROS PCT: 70 %
Platelets: 231 10*3/uL (ref 150–400)
RBC: 3.86 MIL/uL — ABNORMAL LOW (ref 3.87–5.11)
RDW: 12.1 % (ref 11.5–15.5)
WBC: 11.2 10*3/uL — ABNORMAL HIGH (ref 4.0–10.5)

## 2015-08-01 LAB — COMPREHENSIVE METABOLIC PANEL
ALT: 13 U/L — AB (ref 14–54)
AST: 17 U/L (ref 15–41)
Albumin: 3.8 g/dL (ref 3.5–5.0)
Alkaline Phosphatase: 49 U/L (ref 38–126)
Anion gap: 7 (ref 5–15)
BUN: 12 mg/dL (ref 6–20)
CHLORIDE: 103 mmol/L (ref 101–111)
CO2: 26 mmol/L (ref 22–32)
Calcium: 9.5 mg/dL (ref 8.9–10.3)
Creatinine, Ser: 0.88 mg/dL (ref 0.44–1.00)
GFR calc non Af Amer: >60 mL/min (ref >60)
GLUCOSE: 153 mg/dL — AB (ref 65–99)
Potassium: 4.7 mmol/L (ref 3.5–5.1)
SODIUM: 136 mmol/L (ref 135–145)
Total Bilirubin: 0.6 mg/dL (ref 0.3–1.2)
Total Protein: 6.6 g/dL (ref 6.5–8.1)

## 2015-08-01 LAB — I-STAT BETA HCG BLOOD, ED (MC, WL, AP ONLY)

## 2015-08-01 LAB — I-STAT TROPONIN, ED: Troponin i, poc: 0 ng/mL (ref 0.00–0.08)

## 2015-08-01 LAB — MAGNESIUM: MAGNESIUM: 2.3 mg/dL (ref 1.7–2.4)

## 2015-08-01 MED ORDER — ACETAMINOPHEN 325 MG PO TABS
650.0000 mg | ORAL_TABLET | Freq: Four times a day (QID) | ORAL | Status: DC | PRN
  Administered 2015-08-01 – 2015-08-02 (×2): 650 mg via ORAL
  Filled 2015-08-01 (×2): qty 2

## 2015-08-01 MED ORDER — PROPRANOLOL HCL 20 MG PO TABS
20.0000 mg | ORAL_TABLET | Freq: Two times a day (BID) | ORAL | Status: DC
  Administered 2015-08-02: 20 mg via ORAL
  Filled 2015-08-01 (×3): qty 1

## 2015-08-01 MED ORDER — ACETAMINOPHEN 650 MG RE SUPP: 650.0000 mg | Freq: Four times a day (QID) | RECTAL | Status: DC | PRN

## 2015-08-01 MED ORDER — ENOXAPARIN SODIUM 40 MG/0.4ML ~~LOC~~ SOLN
40.0000 mg | SUBCUTANEOUS | Status: DC
  Administered 2015-08-02: 40 mg via SUBCUTANEOUS
  Filled 2015-08-01: qty 0.4

## 2015-08-01 NOTE — H&P (Signed)
CARDIOLOGY HISTORY AND PHYSICAL   Patient ID: Courtney Rice Cardell MRN: [id number], DOB/AGE: 23/10/1990   Admit date: 08/01/2015 Date of Admission: 08/01/2015  Primary Physician: Kathreen CosierMARSHALL,BERNARD A, MD Primary Cardiologist: Dr. Graciela HusbandsKlein   HPI: Courtney Rice Darrough is a 24 y.o. female with past medical history of Long Q-T Syndrome (s/p ICD placement) who presents to [facility]2015 for persistent dizziness and pre-syncope.   Patient reports she has frequent episodes of dizziness, several times per month, which usually last for several minutes and are relieved with lying down. She reports this use to happen before her ICD placement as well. She reports today her dizziness has persisted and anytime she stands up, she feels as if she will pass out. She denies any actual syncopal events. Does report some associated nausea. Denies any headaches, palpitations, chest pain, dyspnea, or firing of her ICD. Reports staying hydrated and consuming a "normal amount" of food today.  Upon arrival to the ED, her BP was noted to be in the 60's. She was given a fluid bolus with improvement of her BP now in the 110's. BMET without any electrolyte abnormalities. Troponin negative.  She reports her Long Q-T syndrome was diagnosed when she was 276 months of age. This was monitored until she developed episodes of ventricular tachycardia and post-partum syncope in 10/2013.  She was initially placed on a Life Vest due to her choice of not wanting an ICD but changed her mind. She later underwent ICD placement with a [employer] SubQ ICD (Model: 1010 SQ-RX Pulse Generator) on 12/20/2013.  She last last examined by Dr. Graciela HusbandsKlein in 07/2014 and doing well at that time. She was instructed to continue taking Propranolol 60mg  BID. She reports since then she takes Propranolol "occasionally", not even once per month. However, she did take the medication today.   Her device was last checked in clinic on 01/16/2015 and  showed no episodes of VT and zero shocks delivered.   Problem List Past Medical History  Diagnosis Date  . Long Q-T syndrome Type 2     a. post partum syncope and VT PM;  b. 11/2013 s/p BSX SubQ ICD placement.    Past Surgical History  Procedure Laterality Date  . Tonsillectomy    . Implantable cardioverter defibrillator implant  12-20-2013    BSX SubQ ICD implanted by Dr Graciela HusbandsKlein  . Implantable cardioverter defibrillator implant N/A 12/20/2013    Procedure: SUB Q ICD;  Surgeon: Duke SalviaSteven C Klein, MD;  [facility];  Service: Cardiovascular;  Laterality: N/A;     Allergies No Known Allergies    Inpatient Medications    Family History Family History  Problem Relation Age of Onset  . Heart disease Mother   . Diabetes Mother   . Asthma Mother   . COPD Mother   . Lupus Mother   . Stroke Mother   . COPD Maternal Grandmother   . Heart disease Maternal Grandmother   . Hearing loss Neg Hx      Social History Social History   Social History  . Marital Status: Single    Spouse Name: N/A  . Number of Children: N/A  . Years of Education: N/A   Occupational History  . Not on file.   Social History Main Topics  . Smoking status: Never Smoker   . Smokeless tobacco: Never Used  . Alcohol Use: No  . Drug Use: No  . Sexual Activity: No     Comment: post partum  Other Topics Concern  . Not on file   Social History Narrative     Review of Systems  General:  No chills, fever, night sweats or weight changes.  Cardiovascular:  No chest pain, dyspnea on exertion, edema, orthopnea, palpitations, paroxysmal nocturnal dyspnea. Dermatological: No rash, lesions/masses Respiratory: No cough, dyspnea Urologic: No hematuria, dysuria Abdominal:   No vomiting, diarrhea, bright red blood per rectum, melena, or hematemesis. Positive for nausea. Neurologic:  No visual changes, wkns, changes in mental status. Positive for dizziness and pre-syncope. All other systems reviewed  and are otherwise negative except as noted above.  Physical Exam Blood pressure 108/67, pulse 54, temperature 97.5 F (36.4 C), temperature source Oral, resp. rate 17, last menstrual period 07/11/2015, SpO2 94 %, unknown if currently breastfeeding.  General: Pleasant, young African American female appearing in NAD Psych: Normal affect. Neuro: Alert and oriented X 3. Moves all extremities spontaneously. HEENT: Normal  Neck: Supple without bruits or JVD. Lungs:  Resp regular and unlabored, CTA without wheezing or rales. Heart: RRR no s3, s4, or murmurs. Abdomen: Soft, non-tender, non-distended, BS + x 4.  Extremities: No clubbing, cyanosis or edema. DP/PT/Radials 2+ and equal bilaterally.  Labs  No results for input(s): CKTOTAL, CKMB, TROPONINI in the last 72 hours. Lab Results  Component Value Date   WBC 11.2* 08/01/2015   HGB 12.0 08/01/2015   HCT 34.3* 08/01/2015   MCV 88.9 08/01/2015   PLT 231 08/01/2015     Recent Labs Lab 08/01/15 1840  NA 136  K 4.7  CL 103  CO2 26  BUN 12  CREATININE 0.88  CALCIUM 9.5  PROT 6.6  BILITOT 0.6  ALKPHOS 49  ALT 13*  AST 17  GLUCOSE 153*    Radiology/Studies  Dg Chest Port 1 View: 08/01/2015  CLINICAL DATA:  24 year old female with dizziness and hypotension. History of ICD. EXAM: PORTABLE CHEST 1 VIEW COMPARISON:  Radiograph dated 12/21/2013 FINDINGS: Left pectoral ICD device in stable positioning. Single-view of the chest does not demonstrate a focal consolidation. There is no pleural effusion or pneumothorax. Stable cardiac silhouette. The osseous structures appear unremarkable. IMPRESSION: No active disease. Electronically Signed   By: Elgie Collard Rice.D.   On: 08/01/2015 19:55    ECG: NSR with rate in 70's. QTc prolonged at 516   ECHOCARDIOGRAM: None on File   ASSESSMENT AND PLAN  1. Long Q-T Syndrome - s/p [employer] ICD placement in 11/2013 - her device will be interrogated tonight by the [employer]. - will obtain echocardiogram - will restart her Propranolol at a lower dose (patient reports it makes her fatigued and that is why she is non-compliant)  2. Dizziness likely secondary to orthostatic hypotension - BP in the 60's upon arrival to the ED. Has received a fluid bolus with improvement into the 110's - will check orthostatic vitals - continue to monitor.  Lorri Frederick, PA-C 08/01/2015, 8:03 PM Pager: [phone]  The patient was seen, examined and discussed with Brittainy Rice. Strader, PA-C and I agree with the above.    24 y.o. female with past medical history of Long Q-T Syndrome, familial, type 2, with h/o recurrent syncope and polymorphic VTs in the post partum period (s/p ICD placement in 09/2013) who presents to [facility] ED on 08/01/2015 for persistent dizziness and pre-syncope.  She was found to be hypotensive, BP in 60' on admission, improved to 100 with iv fluids. She denies ICD firing, poor appetite, fever, chills, decreased fluid  intake. She describes atypical chest pain that she felt today while feeling weak and dizzy and was at rest and pressure like, but she felt similar pain on multiple ocassions in the past, not elated to exertion, can be sharp or pressure like. No syncope since the ICD implantation, no ICD firing. The patient missed several clinic appointments and hasn't had her ICD checked in 6 months.   We will admit, cycle cardiac enzymes, ECGs, hydrate, interrogate her ICD. Check echocardiogram for LVEF in the am, no echo in our system.  Lars Masson  08/01/2015

## 2015-08-01 NOTE — ED Provider Notes (Signed)
CSN: [id number]     Arrival date & time 08/01/15  1759 History   First MD Initiated Contact with Patient 08/01/15 1842     Chief Complaint  Patient presents with  . Hypotension  . Dizziness     (Consider location/radiation/quality/duration/timing/severity/associated sxs/prior Treatment) Patient is a 24 y.o. female presenting with dizziness. The history is provided by the patient.  Dizziness Quality:  Lightheadedness Severity:  Moderate Onset quality:  Gradual Duration:  1 day Timing:  Intermittent Progression:  Waxing and waning Chronicity:  New Context: physical activity and standing up   Context comment:  Pt has history of Long QT and Vtach Relieved by:  Being still Worsened by:  Movement and standing up Ineffective treatments:  None tried Associated symptoms: chest pain and nausea   Associated symptoms: no blood in stool, no diarrhea, no headaches, no hearing loss, no palpitations, no shortness of breath, no syncope, no tinnitus, no vision changes, no vomiting and no weakness   Risk factors: heart disease   Risk factors: no multiple medications and no new medications     Past Medical History  Diagnosis Date  . Long Q-T syndrome Type 2     a. post partum syncope and VT PM;  b. 11/2013 s/p BSX SubQ ICD placement.   Past Surgical History  Procedure Laterality Date  . Tonsillectomy    . Implantable cardioverter defibrillator implant  12-20-2013    BSX SubQ ICD implanted by Dr Graciela Husbands  . Implantable cardioverter defibrillator implant N/A 12/20/2013    Procedure: SUB Q ICD;  Surgeon: Duke Salvia, MD;  Location: [facility] CATH LAB;  Service: Cardiovascular;  Laterality: N/A;   Family History  Problem Relation Age of Onset  . Heart disease Mother   . Diabetes Mother   . Asthma Mother   . COPD Mother   . Lupus Mother   . Stroke Mother   . COPD Maternal Grandmother   . Heart disease Maternal Grandmother   . Hearing loss Neg Hx    Social History  Substance Use Topics  .  Smoking status: Never Smoker   . Smokeless tobacco: Never Used  . Alcohol Use: No   OB History    Gravida Para Term Preterm AB TAB SAB Ectopic Multiple Living   Review of Systems  Constitutional: Negative for fever, diaphoresis and activity change.  HENT: Negative for hearing loss and tinnitus.   Eyes: Negative for pain and visual disturbance.  Respiratory: Negative for chest tightness and shortness of breath.   Cardiovascular: Positive for chest pain. Negative for palpitations and syncope.  Gastrointestinal: Positive for nausea. Negative for vomiting, abdominal pain, diarrhea and blood in stool.  Genitourinary: Negative for dysuria.  Musculoskeletal: Negative for back pain.  Skin: Negative for pallor.  Neurological: Positive for dizziness and light-headedness. Negative for weakness and headaches.  Psychiatric/Behavioral: Negative for confusion.      Allergies  Review of patient's allergies indicates no known allergies.  Home Medications   Prior to Admission medications   Medication Sig Start Date End Date Taking? Authorizing Provider  propranolol (INDERAL) 60 MG tablet Take 1 tablet (60 mg total) by mouth 2 (two) times daily. 01/16/15  Yes Duke Salvia, MD   BP 108/61 mmHg  Pulse 75  Temp(Src) 97.5 F (36.4 C) (Oral)  Resp 17  SpO2 100%  LMP 07/11/2015 Physical Exam  Constitutional: She is oriented to person, place, and time. She  appears well-developed and well-nourished. No distress.  HENT:  Head: Normocephalic and atraumatic.  Eyes: Conjunctivae and EOM are normal. Pupils are equal, round, and reactive to light.  Neck: Normal range of motion.  Cardiovascular: Normal rate and regular rhythm.  Exam reveals no gallop and no friction rub.   No murmur heard. Pulmonary/Chest: Effort normal and breath sounds normal. No respiratory distress. She has no wheezes. She has no rales. She exhibits no tenderness.  Abdominal: Soft. Bowel sounds are normal. She  exhibits no distension and no mass. There is no tenderness. There is no rebound and no guarding.  Musculoskeletal: Normal range of motion.  Neurological: She is alert and oriented to person, place, and time. She has normal strength. No cranial nerve deficit or sensory deficit. Coordination normal. GCS eye subscore is 4. GCS verbal subscore is 5. GCS motor subscore is 6.  Skin: Skin is warm and dry. She is not diaphoretic. No erythema.  Psychiatric: She has a normal mood and affect. Her behavior is normal.    ED Course  Procedures (including critical care time) Labs Review Labs Reviewed  COMPREHENSIVE METABOLIC PANEL - Abnormal; Notable for the following:    Glucose, Bld 153 (*)    ALT 13 (*)    All other components within normal limits  CBC WITH DIFFERENTIAL/PLATELET - Abnormal; Notable for the following:    WBC 11.2 (*)    RBC 3.86 (*)    HCT 34.3 (*)    Neutro Abs 7.9 (*)    All other components within normal limits  MAGNESIUM  I-STAT TROPOININ, ED  I-STAT BETA HCG BLOOD, ED ([facility], [facility], [facility] ONLY)    Imaging Review Dg Chest Port 1 View  08/01/2015  CLINICAL DATA:  24 year old female with dizziness and hypotension. History of ICD. EXAM: PORTABLE CHEST 1 VIEW COMPARISON:  Radiograph dated 12/21/2013 FINDINGS: Left pectoral ICD device in stable positioning. Single-view of the chest does not demonstrate a focal consolidation. There is no pleural effusion or pneumothorax. Stable cardiac silhouette. The osseous structures appear unremarkable. IMPRESSION: No active disease. Electronically Signed   By: Elgie CollardArash  Radparvar M.D.   On: 08/01/2015 19:55   I have personally reviewed and evaluated these images and lab results as part of my medical decision-making.   EKG Interpretation   Date/Time:  Friday August 01 2015 18:36:35 EST Ventricular Rate:  70 PR Interval:  132 QRS Duration: 83 QT Interval:  478 QTC Calculation: 516 R Axis:   86 Text Interpretation:  Sinus rhythm RSR' in V1 or V2,  probably normal  variant Prolonged QT interval No significant change since last tracing  prolonged QTc appreciated Confirmed by Rhunette CroftNANAVATI, MD, Janey GentaANKIT [phone]([id number]) on  08/01/2015 7:02:54 PM      MDM   Final diagnoses:  Hypotension, unspecified hypotension type  Dizziness  Lightheadedness    24 year old African-American female with past medical history of long QT syndrome, V. tach with defibrillator in place presents in the setting of hypotension and lightheadedness. Patient reports today she started having intermittent lightheadedness worse with standing and ambulation. She reports due to continued discomfort as well as one bout of mild chest pain she called cardiology. Patient has not had her ICD checked in over 6 months. Cardiology unable to get patient into clinic this afternoon and advised patient to come to the emergency department for further evaluation.  On arrival patient found to be hypotensive without tachycardia. EKG without significant change and continued prolonged QT. No signs of acute ischemia noted. Patient given IV  fluids with improvement in blood pressure. Patient did not have any symptoms while laying down. Symptoms did return when sitting up. In setting of patient's history cardiology was consult and for further recommendations. Additionally the patient had troponin obtained which was not elevated. No significant electrolyte abnormalities noted. Patient's pregnancy test was negative. Chest x-ray without significant abnormalities.  Discussed case with Dr. Delton See and PA [place] Ouch with cardiology team. At this time patient will be admitted to cardiology for further management and evaluation of symptoms. Patient will have ICD interrogated in the hospital. Patient stable at time of admission.  Attending has seen and evaluated patient and Dr. Rhunette Croft is in agreement with plan.    Stacy Gardner, MD 08/01/15 1610  Derwood Kaplan, MD 08/02/15 [id number]

## 2015-08-01 NOTE — ED Notes (Signed)
As blood pressure resulted in standing position pt stated "I feel dizzy" and asked to sit back down. Pts blood pressure 82/43 reported to RN.

## 2015-08-01 NOTE — ED Notes (Signed)
Pt states she has been dizzy and lightheaded since 0800 this morning, pt states black spots in both eyes and has been nauseaous-  During triage pt noted to have projectile vomit, no blood noted. NT reported to this RN that pt was hypotensive. Pt states she had just walked to the restroom with boyfriend.

## 2015-08-01 NOTE — ED Notes (Signed)
Pt placed into gown and on to monitor upon arrival to room. Pt remains monitored by blood pressure, pulse ox, and 12 lead. Pt has visitor at bedside.

## 2015-08-01 NOTE — Telephone Encounter (Signed)
New problem   Pt has been experiencing dizziness since 8am today and need call back from nurse. Please call pt.

## 2015-08-01 NOTE — Telephone Encounter (Signed)
Patient st she has experienced dizziness since about 0800 today. She cannot go 3-4 steps without having a "hot flash" and feeling like she is going to black out.  She lies down and the sensation dissipates.  She has no blood pressure to report, but patient felt carotid pulse while on the phone. HR = 74. She says her heart beat feels regular and she has no palpitations. She st she has had no shocks delivered via AICD. Patient st she has remained hydrated all day with water.   To Device Clinic.

## 2015-08-01 NOTE — Telephone Encounter (Signed)
Discussed with Device Clinic. Patient is overdue for device check and given symptoms, instructed patient to have someone drive her to the ED for evaluation. Patient agrees with treatment plan.

## 2015-08-01 NOTE — ED Notes (Signed)
Pt reports to RN that she has a defibrillator that was placed after first child, states  "all they told me was if I didn't have the defibrillator I'd die in 6 months." Pt taken to room available and primary RN made aware.

## 2015-08-02 ENCOUNTER — Encounter (HOSPITAL_COMMUNITY): Payer: Self-pay | Admitting: Physician Assistant

## 2015-08-02 ENCOUNTER — Observation Stay (HOSPITAL_BASED_OUTPATIENT_CLINIC_OR_DEPARTMENT_OTHER): Payer: Medicaid Other

## 2015-08-02 DIAGNOSIS — I4581 Long QT syndrome: Secondary | ICD-10-CM | POA: Diagnosis not present

## 2015-08-02 DIAGNOSIS — R9431 Abnormal electrocardiogram [ECG] [EKG]: Secondary | ICD-10-CM

## 2015-08-02 DIAGNOSIS — R42 Dizziness and giddiness: Secondary | ICD-10-CM | POA: Diagnosis not present

## 2015-08-02 LAB — BASIC METABOLIC PANEL
ANION GAP: 3 — AB (ref 5–15)
BUN: 9 mg/dL (ref 6–20)
CALCIUM: 8.6 mg/dL — AB (ref 8.9–10.3)
CO2: 28 mmol/L (ref 22–32)
Chloride: 105 mmol/L (ref 101–111)
Creatinine, Ser: 0.63 mg/dL (ref 0.44–1.00)
GLUCOSE: 128 mg/dL — AB (ref 65–99)
POTASSIUM: 3.5 mmol/L (ref 3.5–5.1)
SODIUM: 136 mmol/L (ref 135–145)

## 2015-08-02 LAB — TSH: TSH: 0.73 u[IU]/mL (ref 0.350–4.500)

## 2015-08-02 MED ORDER — PROPRANOLOL HCL 20 MG PO TABS: 60.0000 mg | ORAL_TABLET | Freq: Two times a day (BID) | ORAL | Status: DC

## 2015-08-02 NOTE — Progress Notes (Signed)
Utilization Review Completed.Zuriyah Shatz T12/31/2016  

## 2015-08-02 NOTE — Progress Notes (Signed)
SUBJECTIVE: The patient is doing well today.  At this time, she denies chest pain, shortness of breath, or any new concerns.   Wants to go home  . enoxaparin (LOVENOX) injection  40 mg Subcutaneous Q24H  . propranolol  20 mg Oral BID      OBJECTIVE: Physical Exam: Filed Vitals:   08/01/15 2030 08/01/15 2131 08/02/15 0520 08/02/15 1137  BP: 108/61  96/50 104/64  Pulse: 75 80 78 84  Temp:  98.6 F (37 C) 98.7 F (37.1 C)   TempSrc:  Oral Oral   Resp: Height:   (1.626 m)    Weight:  117 lb 1.6 oz (53.116 kg) 118 lb 4.8 oz (53.661 kg)   SpO2: 100% 100% 100%     Intake/Output Summary (Last 24 hours) at 08/02/15 1223 Last data filed at 08/02/15 0930  Gross per 24 hour  Intake    600 ml  Output      0 ml  Net    600 ml    Telemetry reveals sinus rhythm  GEN- The patient is well appearing, alert and oriented x 3 today.   Head- normocephalic, atraumatic Eyes-  Sclera clear, conjunctiva pink Ears- hearing intact Oropharynx- clear Neck- supple,  Lungs- Clear to ausculation bilaterally, normal work of breathing Heart- Regular rate and rhythm, no murmurs, rubs or gallops, PMI not laterally displaced GI- soft, NT, ND, + BS Extremities- no clubbing, cyanosis, or edema Skin- no rash or lesion Psych- euthymic mood, full affect Neuro- strength and sensation are intact  LABS: Basic Metabolic Panel:  Recent Labs  69/62/95 1840 08/02/15 0443  NA 136 136  K 4.7 3.5  CL 103 105  CO2 26 28  GLUCOSE 153* 128*  BUN 12 9  CREATININE 0.88 0.63  CALCIUM 9.5 8.6*  MG 2.3  --    Liver Function Tests:  Recent Labs  08/01/15 1840  AST 17  ALT 13*  ALKPHOS 49  BILITOT 0.6  PROT 6.6  ALBUMIN 3.8   No results for input(s): LIPASE, AMYLASE in the last 72 hours. CBC:  Recent Labs  08/01/15 1840  WBC 11.2*  NEUTROABS 7.9*  HGB 12.0  HCT 34.3*  MCV 88.9  PLT 231   Thyroid Function Tests:  Recent Labs  08/02/15 0443  TSH 0.730   Anemia  Panel: No results for input(s): VITAMINB12, FOLATE, FERRITIN, TIBC, IRON, RETICCTPCT in the last 72 hours.  RADIOLOGY: Dg Chest Port 1 View  08/01/2015  CLINICAL DATA:  24 year old female with dizziness and hypotension. History of ICD. EXAM: PORTABLE CHEST 1 VIEW COMPARISON:  Radiograph dated 12/21/2013 FINDINGS: Left pectoral ICD device in stable positioning. Single-view of the chest does not demonstrate a focal consolidation. There is no pleural effusion or pneumothorax. Stable cardiac silhouette. The osseous structures appear unremarkable. IMPRESSION: No active disease. Electronically Signed   By: Elgie Collard M.D.   On: 08/01/2015 19:55    ASSESSMENT AND PLAN:  Principal Problem:   Long Q-T syndrome Active Problems:   Dizziness  1. Long Q-T Syndrome - s/p [employer] ICD placement in 11/2013 - interrogation this admit per report revealed no arrhythmias - will restart her Propranolol at a lower dose (patient reports it makes her fatigued and that is why she is non-compliant) and continue at discharge  2. Dizziness likely secondary to orthostatic hypotension - BP in the 60's upon arrival to the ED. Has received a fluid bolus with improvement into the 110's -  mildly anemic but without overt bleeding - oral hydration encouraged  DC to home Follow-up with EP APP in 1-2 weeks  Hillis RangeJames Jesika Men, MD 08/02/2015 12:23 PM

## 2015-08-02 NOTE — Progress Notes (Signed)
  Echocardiogram 2D Echocardiogram has been performed.  Courtney Rice, Courtney Rice 08/02/2015, 10:01 AM

## 2015-08-02 NOTE — Discharge Summary (Signed)
Discharge Summary   Patient ID: Courtney Rice MRN: [id number], DOB/AGE: 23/10/1990 23 y.o. Admit date: 08/01/2015 D/C date:     08/02/2015  Primary Cardiologist: Dr. Graciela HusbandsKlein   Principal Problem:   Dizziness Active Problems:   Long Q-T syndrome    Admission Dates: 08/01/15-08/02/15 Discharge Diagnosis: Dizziness felt 2/2 orthostatic hypotension ( propranolol dose decreased )  HPI: Courtney Rice is a 24 y.o. female with a history of Long Q-T Syndrome (s/p ICD placement) who presented to [facility] on 08/01/2015 for persistent dizziness and pre-syncope.   She reports her Long Q-T syndrome was diagnosed when she was 116 months of age. This was monitored until she developed episodes of ventricular tachycardia and post-partum syncope in 10/2013. She was initially placed on a Life Vest due to her choice of not wanting an ICD but changed her mind. She later underwent ICD placement with a [employer] SubQ ICD (Model: 1010 SQ-RX Pulse Generator) on 12/20/2013. She last last examined by Dr. Graciela HusbandsKlein in 07/2014 and doing well at that time. She was instructed to continue taking Propranolol 60mg  BID. She reported since then she takes Propranolol "occasionally", not even once per month. However, she did take the medication on the day of presentation. Upon arrival to the ED, her SBP was noted to be in the 60's. She was given a fluid bolus with improvement of her BP to the 110's. BMET without any electrolyte abnormalities. Troponin negative.  Patient reported she had frequent episodes of dizziness, several times per month, which usually last for several minutes and are relieved with lying down. She reported this use to happen before her ICD placement as well. On presentation she complained of dizziness that had persisted and anytime she stands up, she felt as if she will pass out. She denied any actual syncopal events.  She was admitted for repeat device interrogation and 2D ECHO. Both returned  normal. Her pre syncope was felt to be due to orthostatic hypotension and her propranolol was decreased from 60mg  BID to 20mg  BID. She was discharged on 08/02/15.   Hospital Course  1. Long Q-T Syndrome: familial, type 2 - s/p [employer] ICD placement in 11/2013 - interrogation this admit per report revealed no arrhythmias - 2D ECHO normal with normal LVEF - will restart her Propranolol at a lower dose (patient reports it makes her fatigued and that is why she is non-compliant) and continue at discharge. She will continue on Propranolol 20mg  po BID ( was previously on 60mg  BID).   2. Dizziness likely secondary to orthostatic hypotension - BP in the 60's upon arrival to the ED. Has received a fluid bolus with improvement into the 110's - mildly anemic but without overt bleeding - oral hydration encouraged   The patient has had an uncomplicated hospital course and is recovering well.  She has been seen by Dr. Johney FrameAllred today and deemed ready for discharge home. A staff message to arrange follow-up appointments has been sent. Discharge medications are listed below.   Discharge Vitals: Blood pressure 104/64, pulse 84, temperature 98.7 F (37.1 C), temperature source Oral, resp. rate 16, height 5\' 4"  (1.626 m), weight 118 lb 4.8 oz (53.661 kg), last menstrual period 07/11/2015, SpO2 100 %, not currently breastfeeding.  Labs: Lab Results  Component Value Date   WBC 11.2* 08/01/2015   HGB 12.0 08/01/2015   HCT 34.3* 08/01/2015   MCV 88.9 08/01/2015   PLT 231 08/01/2015     Recent Labs Lab  08/01/15 1840 08/02/15 0443  NA 136 136  K 4.7 3.5  CL 103 105  CO2 26 28  BUN 12 9  CREATININE 0.88 0.63  CALCIUM 9.5 8.6*  PROT 6.6  --   BILITOT 0.6  --   ALKPHOS 49  --   ALT 13*  --   AST 17  --   GLUCOSE 153* 128*   No results for input(s): CKTOTAL, CKMB, TROPONINI in the last 72 hours. No results found for: CHOL, HDL, LDLCALC, TRIG No results found for: DDIMER  Diagnostic  Studies/Procedures   Dg Chest Port 1 View  08/01/2015  CLINICAL DATA:  24 year old female with dizziness and hypotension. History of ICD. EXAM: PORTABLE CHEST 1 VIEW COMPARISON:  Radiograph dated 12/21/2013 FINDINGS: Left pectoral ICD device in stable positioning. Single-view of the chest does not demonstrate a focal consolidation. There is no pleural effusion or pneumothorax. Stable cardiac silhouette. The osseous structures appear unremarkable. IMPRESSION: No active disease. Electronically Signed   By: Elgie Collard M.D.   On: 08/01/2015 19:55   2D ECHO: 08/02/2015 LV EF: 55% -  60% Study Conclusions - Left ventricle: The cavity size was normal. Systolic function was normal. The estimated ejection fraction was in the range of 55% to 60%. Wall motion was normal; there were no regional wall motion abnormalities. Left ventricular diastolic function parameters were normal. - Aortic valve: Trileaflet; normal thickness leaflets. There was no regurgitation. - Aortic root: The aortic root was normal in size. - Mitral valve: Structurally normal valve. - Left atrium: The atrium was normal in size. - Right ventricle: The cavity size was mildly dilated. Wall thickness was normal. Pacer wire or catheter noted in right ventricle. Systolic function was normal. - Right atrium: The atrium was normal in size. - Tricuspid valve: Structurally normal valve. There was mild regurgitation. - Pulmonary arteries: Systolic pressure was within the normal range. - Inferior vena cava: The vessel was normal in size. - Pericardium, extracardiac: There was no pericardial effusion.   Discharge Medications     Medication List    TAKE these medications        propranolol 20 MG tablet  Commonly known as:  INDERAL  Take 3 tablets (60 mg total) by mouth 2 (two) times daily.        Disposition   The patient will be discharged in stable condition to home.  Follow-up Information     Follow up with Sherryl Manges, MD.   Specialty:  Cardiology   Why:  The office will call you to make an appoinment with one of Dr. Koren Bound PA or NPs. , If you do not hear from them, please contact them., You should be seen within 1-2 weeks.   Contact information:   [address] [place] [address] [phone]         Duration of Discharge Encounter: Greater than 30 minutes including physician and PA time.  Signed, Cline Crock R PA-C 08/02/2015, 3:30 PM   Jarold Song

## 2015-08-06 NOTE — Progress Notes (Signed)
Electrophysiology Office Note Date: 08/07/2015  ID:  Courtney Rice, DOB 05-11-1991, MRN [id number]  PCP: Kathreen Cosier, MD Electrophysiologist: Graciela Husbands  CC: Hospital follow up  Courtney Rice is a 25 y.o. female seen today for Dr Graciela Husbands.  She was recently admitted for dizziness and was found to be hypotensive.  She was hydrated with improvement in symptoms. There were no arrhythmias on ICD per report. She presents today for follow up.  Her Propranolol dose was reduced during the admission.  Since discharge, the patient reports doing very well. She denies chest pain, palpitations, dyspnea, PND, orthopnea, nausea, vomiting, dizziness, syncope, edema, weight gain, or early satiety.  She has not had ICD shocks.   She graduates from [place] with an associate degree in business from [employer]  Device History: BSX S-ICD implanted 2015 for long QT, PMVT History of appropriate therapy: No History of AAD therapy: No   Past Medical History  Diagnosis Date  . Long Q-T syndrome Type 2     a. post partum syncope and VT PM;  b. 11/2013 s/p BSX SubQ ICD placement.  Marland Kitchen AICD (automatic cardioverter/defibrillator) present   . Migraine   . Pre-syncope admitted 08/01/2015    a. felt to be 2/2 orthostatic hypotension - propranolol decreased.    Past Surgical History  Procedure Laterality Date  . Implantable cardioverter defibrillator implant  12-20-2013    BSX SubQ ICD implanted by Dr Graciela Husbands  . Implantable cardioverter defibrillator implant N/A 12/20/2013    Procedure: SUB Q ICD;  Surgeon: Duke Salvia, MD;  Location: [facility] CATH LAB;  Service: Cardiovascular;  Laterality: N/A;  . Tonsillectomy and adenoidectomy  2011    Current Outpatient Prescriptions  Medication Sig Dispense Refill  . propranolol (INDERAL) 20 MG tablet Take 3 tablets (60 mg total) by mouth 2 (two) times daily. 60 tablet 6   No current facility-administered medications for this visit.    Allergies:   Review of patient's  allergies indicates no known allergies.   Social History: Social History   Social History  . Marital Status: Single    Spouse Name: N/A  . Number of Children: N/A  . Years of Education: N/A   Occupational History  . Not on file.   Social History Main Topics  . Smoking status: Never Smoker   . Smokeless tobacco: Never Used  . Alcohol Use: No  . Drug Use: No  . Sexual Activity: Yes     Comment: post partum   Other Topics Concern  . Not on file   Social History Narrative    Family History: Family History  Problem Relation Age of Onset  . Heart disease Mother   . Diabetes Mother   . Asthma Mother   . COPD Mother   . Lupus Mother   . Stroke Mother   . COPD Maternal Grandmother   . Heart disease Maternal Grandmother   . Hearing loss Neg Hx   . Heart attack Neg Hx   . Hypertension Mother     Review of Systems: All other systems reviewed and are otherwise negative except as noted above.   Physical Exam: VS:  BP 110/68 mmHg  Pulse 80  Ht 5\' 4"  (1.626 m)  Wt 120 lb (54.432 kg)  BMI 20.59 kg/m2  LMP 07/11/2015 (Approximate) , BMI Body mass index is 20.59 kg/(m^2).  GEN- The patient is well appearing, alert and oriented x 3 today.   HEENT: normocephalic, atraumatic; sclera clear, conjunctiva pink; hearing intact; oropharynx  clear; neck supple  Lungs- Clear to ausculation bilaterally, normal work of breathing.  No wheezes, rales, rhonchi Heart- Regular rate and rhythm, no murmurs, rubs or gallops   GI- soft, non-tender, non-distended, bowel sounds present Extremities- no clubbing, cyanosis, or edema; DP/PT/radial pulses 2+ bilaterally MS- no significant deformity or atrophy Skin- warm and dry, no rash or lesion; ICD pocket well healed Psych- euthymic mood, full affect Neuro- strength and sensation are intact  ICD interrogation- reviewed in detail today,  See PACEART report  EKG:  EKG is ordered today. EKG today demonstrates sinus rhythm, QTc 528msec  Recent  Labs: 08/01/2015: ALT 13*; Hemoglobin 12.0; Magnesium 2.3; Platelets 231 08/02/2015: BUN 9; Creatinine, Ser 0.63; Potassium 3.5; Sodium 136; TSH 0.730   Wt Readings from Last 3 Encounters:  08/07/15 120 lb (54.432 kg)  08/02/15 118 lb 4.8 oz (53.661 kg)  07/03/14 120 lb (54.432 kg)     Other studies Reviewed: Additional studies/ records that were reviewed today include: hospital records  Assessment and Plan:  1.  Long QT syndrome Normal device function with no arrhythmias that corresponded to dizziness during hospitalization She was discharged on 60mg  bid of propranolol but this dose causes significant fatigue and she has not been taking it She is willing to try 20mg  bid for now  2.  Dizziness/orthostatic hypotension Adequate hydration encouraged Lab work from hospital demonstrated mild anemia but no overt bleeding.  Follow up with PCP encouraged   Current medicines are reviewed at length with the patient today.   The patient does not have concerns regarding her medicines.  The following changes were made today:  none  Labs/ tests ordered today include: none   Disposition:   Follow up with me in 3 months    Signed, Gypsy BalsamAmber Seiler, NP 08/07/2015 10:36 AM  [facility] [address] [place] [address] [phone] (office) [phone] (fax)

## 2015-08-07 ENCOUNTER — Encounter: Payer: Self-pay | Admitting: Internal Medicine

## 2015-08-07 ENCOUNTER — Encounter: Payer: Self-pay | Admitting: Nurse Practitioner

## 2015-08-07 ENCOUNTER — Ambulatory Visit (INDEPENDENT_AMBULATORY_CARE_PROVIDER_SITE_OTHER): Payer: Medicaid Other | Admitting: Nurse Practitioner

## 2015-08-07 VITALS — BP 110/68 | HR 80 | Ht 64.0 in | Wt 120.0 lb

## 2015-08-07 DIAGNOSIS — I951 Orthostatic hypotension: Secondary | ICD-10-CM

## 2015-08-07 DIAGNOSIS — I4581 Long QT syndrome: Secondary | ICD-10-CM

## 2015-08-07 MED ORDER — PROPRANOLOL HCL 20 MG PO TABS: 20.0000 mg | ORAL_TABLET | Freq: Two times a day (BID) | ORAL | Status: DC

## 2015-08-07 NOTE — Patient Instructions (Signed)
Medication Instructions:   START TAKING PROPRANOLOL 20 MG TWICE A DAY    If you need a refill on your cardiac medications before your next appointment, please call your pharmacy.  Labwork: NONE ORDER TODAY   Testing/Procedures: NONE ORDER TODAY   Follow-Up: SOME ONE WILL CALL YOU WITH AN APPT TIME ONCE April SCHEDULED IS OPEN FOR APPOINTMENTS   Any Other Special Instructions Will Be Listed Below (If Applicable).

## 2015-08-15 ENCOUNTER — Telehealth: Payer: Self-pay | Admitting: Internal Medicine

## 2015-08-15 NOTE — Telephone Encounter (Signed)
Pt c/o of Chest Pain: STAT if CP now or developed within 24 hours  1. Are you having CP right now? Yes  2. Are you experiencing any other symptoms (ex. SOB, nausea, vomiting, sweating)? sob  3. How long have you been experiencing CP? Since yesterday  4. Is your CP continuous or coming and going? both  5. Have you taken Nitroglycerin? no ?

## 2015-08-15 NOTE — Telephone Encounter (Signed)
Patient stated that she has been having CP since yesterday.  Says the pain involves the right side of chest, right arm and her right side and back.  Pain is constant the intensity varies.  Patient was able to provide a B/P and said her HR isn't racing, feels normal.  Denies coughing, swelling, nausea or dizziness.  Has  had a headache for 2 weeks.  Says somedays it's like a migraine and somedays it's like a "regular" headache.  Couldn't describe a location of headache.  States she is taking her Inderal daily.

## 2015-08-15 NOTE — Telephone Encounter (Signed)
Called patient and advised that I had reviewed her info with DOD (Dr Tenny Crawoss).  DOD suggested that these symptoms do not sound cardiac related. Suggested that she go to her pcp.  Patient stated that she doesn't have one.  I suggested that she go to an Urgent Care for f/u on her symptoms. Patient expressed agreement with suggestion.

## 2015-08-16 ENCOUNTER — Emergency Department (HOSPITAL_COMMUNITY): Payer: Medicaid Other

## 2015-08-16 ENCOUNTER — Encounter (HOSPITAL_COMMUNITY): Payer: Self-pay

## 2015-08-16 ENCOUNTER — Emergency Department (HOSPITAL_COMMUNITY)
Admission: EM | Admit: 2015-08-16 | Discharge: 2015-08-16 | Disposition: A | Discharge status: Home / Self Care | Payer: Medicaid Other | Attending: Emergency Medicine | Admitting: Emergency Medicine

## 2015-08-16 DIAGNOSIS — Z79899 Other long term (current) drug therapy: Secondary | ICD-10-CM | POA: Insufficient documentation

## 2015-08-16 DIAGNOSIS — R0789 Other chest pain: Secondary | ICD-10-CM | POA: Diagnosis not present

## 2015-08-16 DIAGNOSIS — R079 Chest pain, unspecified: Secondary | ICD-10-CM | POA: Diagnosis present

## 2015-08-16 DIAGNOSIS — R0602 Shortness of breath: Secondary | ICD-10-CM | POA: Insufficient documentation

## 2015-08-16 DIAGNOSIS — Z8679 Personal history of other diseases of the circulatory system: Secondary | ICD-10-CM | POA: Diagnosis not present

## 2015-08-16 DIAGNOSIS — Z3202 Encounter for pregnancy test, result negative: Secondary | ICD-10-CM | POA: Diagnosis not present

## 2015-08-16 DIAGNOSIS — Z95810 Presence of automatic (implantable) cardiac defibrillator: Secondary | ICD-10-CM | POA: Insufficient documentation

## 2015-08-16 DIAGNOSIS — R0781 Pleurodynia: Secondary | ICD-10-CM

## 2015-08-16 LAB — BASIC METABOLIC PANEL
Anion gap: 11 (ref 5–15)
BUN: <5 mg/dL — ABNORMAL LOW (ref 6–20)
CHLORIDE: 103 mmol/L (ref 101–111)
CO2: 25 mmol/L (ref 22–32)
CREATININE: 0.71 mg/dL (ref 0.44–1.00)
Calcium: 9.4 mg/dL (ref 8.9–10.3)
GFR calc non Af Amer: >60 mL/min (ref >60)
GLUCOSE: 77 mg/dL (ref 65–99)
Potassium: 4 mmol/L (ref 3.5–5.1)
Sodium: 139 mmol/L (ref 135–145)

## 2015-08-16 LAB — CBC
HCT: 35.9 % — ABNORMAL LOW (ref 36.0–46.0)
Hemoglobin: 12.4 g/dL (ref 12.0–15.0)
MCH: 31.2 pg (ref 26.0–34.0)
MCHC: 34.5 g/dL (ref 30.0–36.0)
MCV: 90.4 fL (ref 78.0–100.0)
Platelets: 326 K/uL (ref 150–400)
RBC: 3.97 MIL/uL (ref 3.87–5.11)
RDW: 12.3 % (ref 11.5–15.5)
WBC: 6.8 K/uL (ref 4.0–10.5)

## 2015-08-16 LAB — D-DIMER, QUANTITATIVE: D-Dimer, Quant: 3.91 ug/mL-FEU — ABNORMAL HIGH (ref 0.00–0.50)

## 2015-08-16 LAB — I-STAT TROPONIN, ED: Troponin i, poc: 0.01 ng/mL (ref 0.00–0.08)

## 2015-08-16 LAB — I-STAT BETA HCG BLOOD, ED (MC, WL, AP ONLY): I-stat hCG, quantitative: <5 m[IU]/mL

## 2015-08-16 MED ORDER — IOHEXOL 350 MG/ML SOLN
80.0000 mL | Freq: Once | INTRAVENOUS | Status: AC | PRN
  Administered 2015-08-16: 80 mL via INTRAVENOUS

## 2015-08-16 MED ORDER — CYCLOBENZAPRINE HCL 10 MG PO TABS: 10.0000 mg | ORAL_TABLET | Freq: Two times a day (BID) | ORAL | Status: DC | PRN

## 2015-08-16 NOTE — ED Provider Notes (Addendum)
CSN: [id number]     Arrival date & time 08/16/15  0848 History   First MD Initiated Contact with Patient 08/16/15 [phone]     Chief Complaint  Patient presents with  . Chest Pain     (Consider location/radiation/quality/duration/timing/severity/associated sxs/prior Treatment) Patient is a 25 y.o. female presenting with chest pain. The history is provided by the patient.  Chest Pain Pain location:  R chest and R lateral chest Pain quality: sharp and shooting   Radiates to: right upper arm and right back. Pain radiates to the back: no   Pain severity:  Moderate Onset quality:  Sudden Duration:  3 days Timing:  Constant Progression:  Waxing and waning Chronicity:  Recurrent (but this time it has lasted longer than usual) Context: at rest   Context: no trauma   Context comment:  No recent colds or congestion Relieved by: Some improvement with Tylenol. Worsened by:  Coughing and deep breathing (Lying flat) Associated symptoms: shortness of breath   Associated symptoms: no abdominal pain, no cough, no fever, no lower extremity edema, not vomiting and no weakness   Associated symptoms comment:  She describes the shortness of breath is more related to it hurts to take a deep breath and if she could take a full breath without pain she would not be short of breath Risk factors: no immobilization, no prior DVT/PE, no smoking and no surgery   Risk factors comment:  History of prolonged QT syndrome currently with a defibrillator and takes 20 mg of propranolol twice a day.  Blood clots do not run in her family   Past Medical History  Diagnosis Date  . Long Q-T syndrome Type 2     a. post partum syncope and VT PM;  b. 11/2013 s/p BSX SubQ ICD placement.  Marland Kitchen AICD (automatic cardioverter/defibrillator) present   . Migraine   . Pre-syncope admitted 08/01/2015    a. felt to be 2/2 orthostatic hypotension - propranolol decreased.    Past Surgical History  Procedure Laterality Date  . Implantable  cardioverter defibrillator implant  12-20-2013    BSX SubQ ICD implanted by Dr Graciela Husbands  . Implantable cardioverter defibrillator implant N/A 12/20/2013    Procedure: SUB Q ICD;  Surgeon: Duke Salvia, MD;  Location: [facility] CATH LAB;  Service: Cardiovascular;  Laterality: N/A;  . Tonsillectomy and adenoidectomy  2011   Family History  Problem Relation Age of Onset  . Heart disease Mother   . Diabetes Mother   . Asthma Mother   . COPD Mother   . Lupus Mother   . Stroke Mother   . COPD Maternal Grandmother   . Heart disease Maternal Grandmother   . Hearing loss Neg Hx   . Heart attack Neg Hx   . Hypertension Mother    Social History  Substance Use Topics  . Smoking status: Never Smoker   . Smokeless tobacco: Never Used  . Alcohol Use: No   OB History    Gravida Para Term Preterm AB TAB SAB Ectopic Multiple Living   1 1 1       1      Review of Systems  Constitutional: Negative for fever.  Respiratory: Positive for shortness of breath. Negative for cough.   Cardiovascular: Positive for chest pain.  Gastrointestinal: Negative for vomiting and abdominal pain.       States over the last week she had intermittent abdominal cramping and pain after eating which has now resolved in the last 2 days. She denies  any diarrhea, nausea or vomiting.  Neurological: Negative for weakness.       For the last 2 weeks is that intermittent migraine headaches  All other systems reviewed and are negative.     Allergies  Review of patient's allergies indicates no known allergies.  Home Medications   Prior to Admission medications   Medication Sig Start Date End Date Taking? Authorizing Provider  acetaminophen (TYLENOL) 325 MG tablet Take 650 mg by mouth every 6 (six) hours as needed for mild pain, moderate pain, fever or headache.   Yes Historical Provider, MD  propranolol (INDERAL) 20 MG tablet Take 1 tablet (20 mg total) by mouth 2 (two) times daily. 08/07/15  Yes Amber Caryl Bis, NP   BP 108/70  mmHg  Pulse 81  Temp(Src) 98 F (36.7 C) (Oral)  Resp 19  Ht 5\' 4"  (1.626 m)  Wt 120 lb (54.432 kg)  BMI 20.59 kg/m2  SpO2 100%  LMP 08/11/2015 Physical Exam  Constitutional: She is oriented to person, place, and time. She appears well-developed and well-nourished. No distress.  HENT:  Head: Normocephalic and atraumatic.  Mouth/Throat: Oropharynx is clear and moist.  Eyes: Conjunctivae and EOM are normal. Pupils are equal, round, and reactive to light.  Neck: Normal range of motion. Neck supple.    Cardiovascular: Normal rate, regular rhythm and intact distal pulses.   No murmur heard. Pulses equal in bilateral upper extremities  Pulmonary/Chest: Effort normal and breath sounds normal. No respiratory distress. She has no wheezes. She has no rales. She exhibits tenderness.    Abdominal: Soft. She exhibits no distension. There is no tenderness. There is no rebound and no guarding.  Musculoskeletal: Normal range of motion. She exhibits no edema or tenderness.  Neurological: She is alert and oriented to person, place, and time.  Skin: Skin is warm and dry. No rash noted. No erythema.  Psychiatric: She has a normal mood and affect. Her behavior is normal.  Nursing note and vitals reviewed.   ED Course  Procedures (including critical care time) Labs Review Labs Reviewed  BASIC METABOLIC PANEL - Abnormal; Notable for the following:    BUN <5 (*)    All other components within normal limits  CBC - Abnormal; Notable for the following:    HCT 35.9 (*)    All other components within normal limits  D-DIMER, QUANTITATIVE (NOT AT [facility]) - Abnormal; Notable for the following:    D-Dimer, Quant 3.91 (*)    All other components within normal limits  I-STAT TROPOININ, ED  I-STAT BETA HCG BLOOD, ED ([facility], [facility], [facility] ONLY)    Imaging Review Dg Chest 2 View  08/16/2015  CLINICAL DATA:  Migraine for 2 weeks.  Right-sided chest pain. EXAM: CHEST  2 VIEW COMPARISON:  08/01/2015 FINDINGS: ICD  device is stable projecting over the midline of the heart. Normal heart size. Clear lungs. No pneumothorax. IMPRESSION: No active cardiopulmonary disease. Electronically Signed   By: Jolaine Click M.D.   On: 08/16/2015 10:11   Ct Angio Chest Pe W/cm &/or Wo Cm  08/16/2015  CLINICAL DATA:  25 year old patient with cardiac history with the femoral later device. Chest pain for several weeks. Now worsening neck and chest pain. Concern pulmonary embolism. EXAM: CT ANGIOGRAPHY CHEST WITH CONTRAST TECHNIQUE: Multidetector CT imaging of the chest was performed using the standard protocol during bolus administration of intravenous contrast. Multiplanar CT image reconstructions and MIPs were obtained to evaluate the vascular anatomy. CONTRAST:  80mL OMNIPAQUE IOHEXOL 350 MG/ML  SOLN COMPARISON:  Chest radiograph 08/16/2015 FINDINGS: Mediastinum/Nodes: No filling defects within the pulmonary arteries to suggest acute pulmonary embolism. No acute findings aorta great vessels. Small amount residual thymus in the anterior mediastinum. No axillary supraclavicular adenopathy. No mediastinal adenopathy. Esophagus normal. Lungs/Pleura: Lungs are clear. No pulmonary infarction. No airspace disease. No pleural fluid or pneumothorax. Upper abdomen: Limited view of the liver, kidneys, pancreas are unremarkable. Normal adrenal glands. Musculoskeletal: There is a ICD device in the LEFT chest wall laterally. Electrode along the sternum in the subcutaneous tissue. Review of the MIP images confirms the above findings. IMPRESSION: 1. No pulmonary embolism. 2. No acute pulmonary parenchymal findings or cardiac findings. Electronically Signed   By: Genevive BiStewart  Edmunds M.D.   On: 08/16/2015 12:42   I have personally reviewed and evaluated these images and lab results as part of my medical decision-making.   EKG Interpretation   Date/Time:  Saturday August 16 2015 08:53:56 EST Ventricular Rate:  80 PR Interval:  142 QRS Duration: 78 QT  Interval:  411 QTC Calculation: 474 R Axis:   100 Text Interpretation:  Sinus rhythm Borderline right axis deviation  Borderline ST elevation, lateral leads new t-wave inversion in V3  Confirmed by [facility]  MD, Alphonzo LemmingsWHITNEY ([id number]) on 08/16/2015 9:07:24 AM      MDM   Final diagnoses:  Pleuritic chest pain   patient is a 25 year old female with a history of prolonged QT with an ICD placed approximately 2 years ago on propranolol twice a day which was recently changed because of her hospital admission 2 weeks ago for hypotension and dizziness.  She had been doing better after the medication change followed up with her cardiologist approximately 10 days ago and was doing well. However she states for the last 3 days she's had a pleuritic type right upper chest and neck pain that has been ongoing. Deep breathing and lying flat. She denies any URI symptoms but has had some intermittent abdominal cramping, decreased oral intake and headaches for the last week and a half. The abdominal pain has resolved as well as the headaches. Patient states she'll intermittently get sharp pains in her chest like this but that never lasted this long. She is a low risk Wells criteria. EKG is relatively unchanged except for a new T-wave inversion in V3 only.  Patient is otherwise stable and well appearing on exam. She has equal pulses bilaterally and no symptoms going down the arm or into the fingers, abdomen or legs. Low suspicion that this is a dissection. Possibility that this is musculoskeletal versus pleurisy as PE. Low suspicion that this is cardiac in nature.  CBC, BMP, troponin, d-dimer, chest x-ray pending  10:50 AM Labs and x-ray are normal except for an elevated d-dimer of 3.9. A CT ordered to further evaluate for PE.  1:01 PM CT is negative for acute PE or other pathology. Most likely this is musculoskeletal in nature. Patient encouraged to continue Tylenol or ibuprofen and was given Flexeril to use at  night. Gwyneth SproutWhitney Zoe Goonan, MD 08/16/15 1051  Gwyneth SproutWhitney Arilynn Blakeney, MD 08/16/15 1302  Gwyneth SproutWhitney Ocean Schildt, MD 08/16/15 1303

## 2015-08-16 NOTE — ED Notes (Signed)
Patient transported to CT 

## 2015-08-16 NOTE — Discharge Instructions (Signed)
Chest Wall Pain °Chest wall pain is pain in or around the bones and muscles of your chest. Sometimes, an injury causes this pain. Sometimes, the cause may not be known. This pain may take several weeks or longer to get better. °HOME CARE °Pay attention to any changes in your symptoms. Take these actions to help with your pain: °· Rest as told by your doctor. °· Avoid activities that cause pain. Try not to use your chest, belly (abdominal), or side muscles to lift heavy things. °· If directed, apply ice to the painful area: °¨ Put ice in a plastic bag. °¨ Place a towel between your skin and the bag. °¨ Leave the ice on for 20 minutes, 2-3 times per day. °· Take over-the-counter and prescription medicines only as told by your doctor. °· Do not use tobacco products, including cigarettes, chewing tobacco, and e-cigarettes. If you need help quitting, ask your doctor. °· Keep all follow-up visits as told by your doctor. This is important. °GET HELP IF: °· You have a fever. °· Your chest pain gets worse. °· You have new symptoms. °GET HELP RIGHT AWAY IF: °· You feel sick to your stomach (nauseous) or you throw up (vomit). °· You feel sweaty or light-headed. °· You have a cough with phlegm (sputum) or you cough up blood. °· You are short of breath. °  °This information is not intended to replace advice given to you by your health care provider. Make sure you discuss any questions you have with your health care provider. °  °Document Released: 01/05/2008 Document Revised: 04/09/2015 Document Reviewed: 10/14/2014 °Elsevier Interactive Patient Education ©2016 [employer] ° °

## 2015-08-16 NOTE — ED Notes (Signed)
Pt ambulated to the bathroom with ease 

## 2015-08-16 NOTE — ED Notes (Addendum)
Pt. Coming from home per POV c/o chest pain, neck pain, and back pain. Pt. Reports pain is in right chest and radiates to neck and back on right side. Pt. sts pain is worse when laying down or taking deep breath.  Pt. sts that pain started two days ago. She contacted her cardiologist who said it probably was not heart related. Back and  Neck pain more severe since yesterday. Hx of prolonged QT and Vtach. Pt. Has [employer] ICD in left lateral chest.

## 2016-02-09 ENCOUNTER — Other Ambulatory Visit (HOSPITAL_COMMUNITY): Payer: Self-pay

## 2016-04-03 LAB — LAB REPORT - SCANNED

## 2016-05-03 ENCOUNTER — Telehealth: Payer: Self-pay | Admitting: Internal Medicine

## 2016-05-03 NOTE — Telephone Encounter (Signed)
Mrs. Delena ServeWharton (grandmother ) is calling because Elayne Snarerachelle says she his having some rapid heartbeat and dizziness . Please call Shamaya at [phone]  Thanks

## 2016-05-03 NOTE — Telephone Encounter (Signed)
lmtcb

## 2016-05-04 ENCOUNTER — Encounter: Payer: Self-pay | Admitting: Internal Medicine

## 2016-05-04 ENCOUNTER — Ambulatory Visit (INDEPENDENT_AMBULATORY_CARE_PROVIDER_SITE_OTHER): Payer: Self-pay | Admitting: Internal Medicine

## 2016-05-04 VITALS — BP 116/70 | HR 70 | Ht 64.0 in | Wt 118.2 lb

## 2016-05-04 DIAGNOSIS — I4729 Other ventricular tachycardia: Secondary | ICD-10-CM

## 2016-05-04 DIAGNOSIS — I472 Ventricular tachycardia: Secondary | ICD-10-CM

## 2016-05-04 DIAGNOSIS — I4581 Long QT syndrome: Secondary | ICD-10-CM

## 2016-05-04 LAB — CUP PACEART INCLINIC DEVICE CHECK
Implantable Lead Implant Date: 20150521
Implantable Lead Location: 753862
Implantable Lead Model: 3400
MDC IDC SESS DTM: 20171003112543
Pulse Gen Model: 1010

## 2016-05-04 NOTE — Telephone Encounter (Signed)
appt 05/04/16 with Dr Ladona Ridgelaylor

## 2016-05-04 NOTE — Progress Notes (Signed)
HPI Courtney Rice returns today evaluation. She is a pleasant 25 yo woman with a h/o long QT, syncope, PMVT and subsequent subcutaneous ICD implantation. She has been under increased stress over with her childs father. She continues to go to school and work. She has had no ICD therapies and has taken her beta blocker as prescribed.  No Known Allergies   Current Outpatient Prescriptions  Medication Sig Dispense Refill  . acetaminophen (TYLENOL) 325 MG tablet Take 650 mg by mouth every 6 (six) hours as needed for mild pain, moderate pain, fever or headache.    . propranolol (INDERAL) 20 MG tablet Take 1 tablet (20 mg total) by mouth 2 (two) times daily. 60 tablet 6   No current facility-administered medications for this visit.      Past Medical History:  Diagnosis Date  . AICD (automatic cardioverter/defibrillator) present   . Long Q-T syndrome Type 2    a. post partum syncope and VT PM;  b. 11/2013 s/p BSX SubQ ICD placement.  . Migraine   . Pre-syncope admitted 08/01/2015   a. felt to be 2/2 orthostatic hypotension - propranolol decreased.     ROS:   All systems reviewed and negative except as noted in the HPI.   Past Surgical History:  Procedure Laterality Date  . IMPLANTABLE CARDIOVERTER DEFIBRILLATOR IMPLANT  12-20-2013   BSX SubQ ICD implanted by Dr Graciela HusbandsKlein  . IMPLANTABLE CARDIOVERTER DEFIBRILLATOR IMPLANT N/A 12/20/2013   Procedure: SUB Q ICD;  Surgeon: Duke SalviaSteven C Klein, MD;  [facility];  Service: Cardiovascular;  Laterality: N/A;  . TONSILLECTOMY AND ADENOIDECTOMY  2011     Family History  Problem Relation Age of Onset  . Heart disease Mother   . Diabetes Mother   . Asthma Mother   . COPD Mother   . Lupus Mother   . Stroke Mother   . Hypertension Mother   . COPD Maternal Grandmother   . Heart disease Maternal Grandmother   . Hearing loss Neg Hx   . Heart attack Neg Hx      Social History   Social History  . Marital status: Single    Spouse  name: N/A  . Number of children: N/A  . Years of education: N/A   Occupational History  . Not on file.   Social History Main Topics  . Smoking status: Never Smoker  . Smokeless tobacco: Never Used  . Alcohol use No  . Drug use: No  . Sexual activity: Yes     Comment: post partum   Other Topics Concern  . Not on file   Social History Narrative  . No narrative on file     BP 116/70   Pulse 70   Ht 5\' 4"  (1.626 m)   Wt 118 lb 3.2 oz (53.6 kg)   LMP 04/27/2016 (Approximate)   SpO2 94%   BMI 20.29 kg/m   Physical Exam:  Well appearing young woman, NAD HEENT: Unremarkable Neck:  6 cm JVD, no thyromegally Lymphatics:  No adenopathy Back:  No CVA tenderness Lungs:  Clear with no wheezes HEART:  Regular rate rhythm, no murmurs, no rubs, no clicks Abd:  soft, positive bowel sounds, no organomegally, no rebound, no guarding Ext:  2 plus pulses, no edema, no cyanosis, no clubbing Skin:  No rashes no nodules Neuro:  CN II through XII intact, motor grossly intact  EKG - nsr with QT prolongation  DEVICE  Normal device function.  See PaceArt  for details.   Assess/Plan: 1. Long QT - She will continue her beta blocker. Her QT remains long. 2. Syncope - she has had no recurrent symptoms. 3. ICD - her [employer] subcutaneous ICD is working normally. Will follow.  Leonia Reeves.D.

## 2016-05-04 NOTE — Patient Instructions (Signed)
Medication Instructions:  Your physician recommends that you continue on your current medications as directed. Please refer to the Current Medication list given to you today.   Labwork: None ordered   Testing/Procedures: None ordered   Follow-Up: Your physician recommends that you schedule a follow-up appointment in: 3 months with Dr Klein   Any Other Special Instructions Will Be Listed Below (If Applicable).     If you need a refill on your cardiac medications before your next appointment, please call your pharmacy.   

## 2016-10-08 ENCOUNTER — Encounter: Payer: Self-pay | Admitting: Cardiology

## 2017-02-11 IMAGING — CT CT ANGIO CHEST
2 of 7 series · 18 of 36 positions shown · IV contrast (Omni 300)
Comparison: Chest radiograph 08/16/2015

CLINICAL DATA: 24-year-old patient with cardiac history with the
femoral later device. Chest pain for several weeks. Now worsening
neck and chest pain. Concern pulmonary embolism.

EXAM:
CT ANGIOGRAPHY CHEST WITH CONTRAST
TECHNIQUE: Multidetector CT imaging of the chest was performed using the
standard protocol during bolus administration of intravenous
contrast. Multiplanar CT image reconstructions and MIPs were
obtained to evaluate the vascular anatomy.
CONTRAST:  80mL OMNIPAQUE IOHEXOL 350 MG/ML SOLN

[Series 7: pe thins · axial · 0.62mm/px · z∈[+1185,+1410]mm · 17 of 509 slices shown]
[im 29/509  lung]
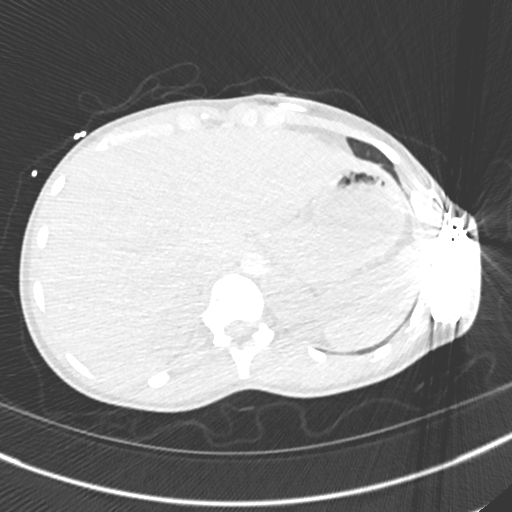
[im 57/509  mediastinal]
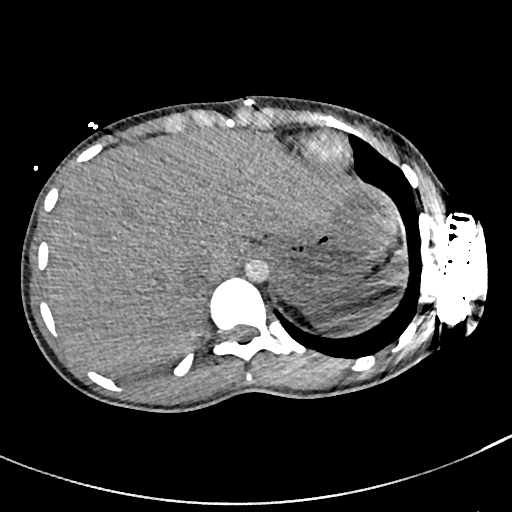
[im 85/509  lung]
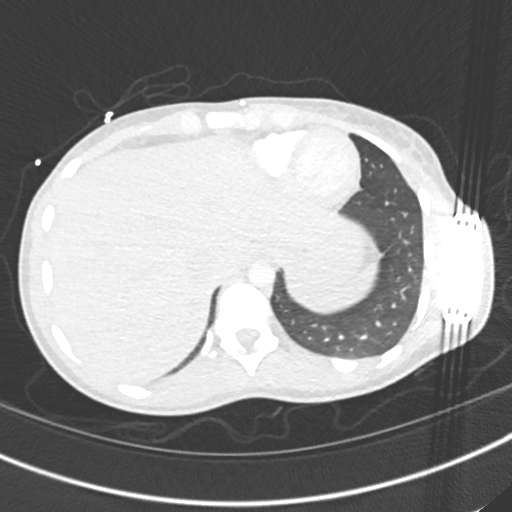
[im 113/509  mediastinal]
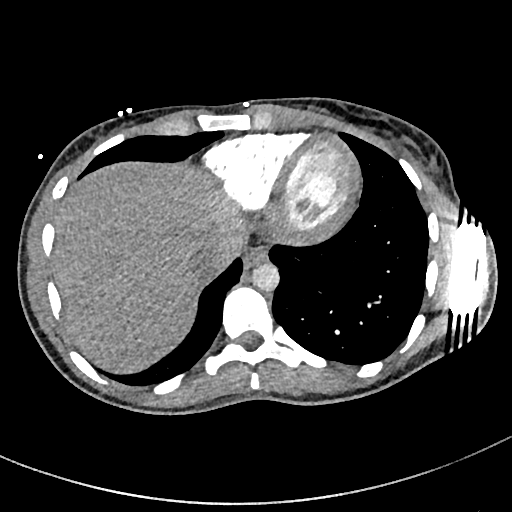
[im 142/509  lung]
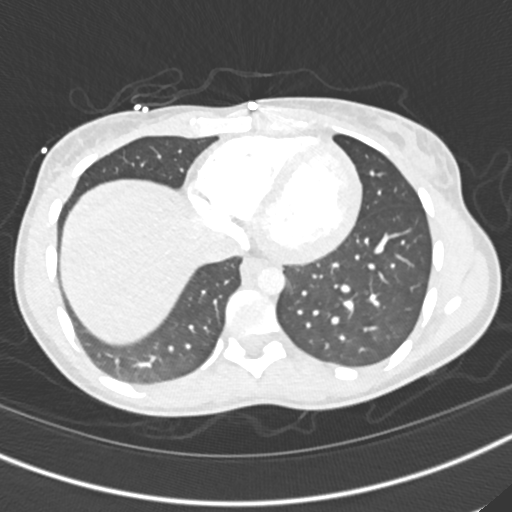
[im 170/509  mediastinal]
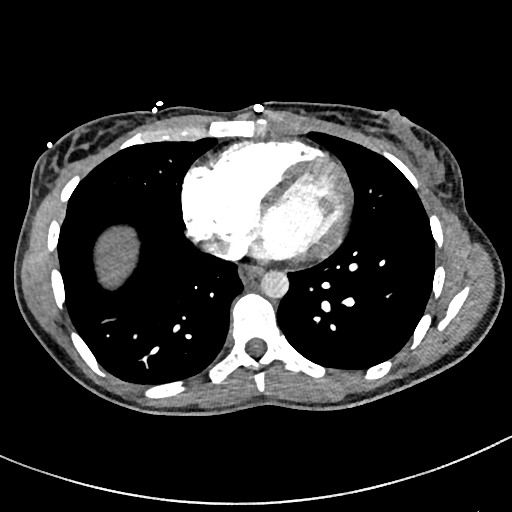
[im 198/509  lung]
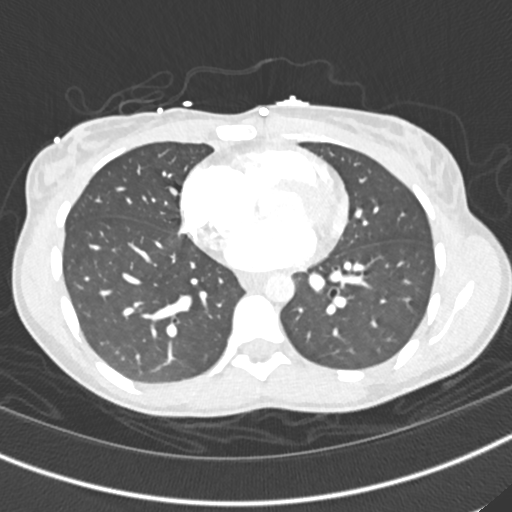
[im 226/509  mediastinal]
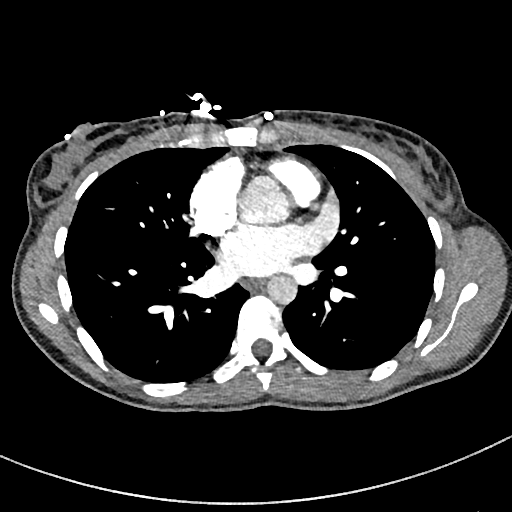
[im 255/509  lung]
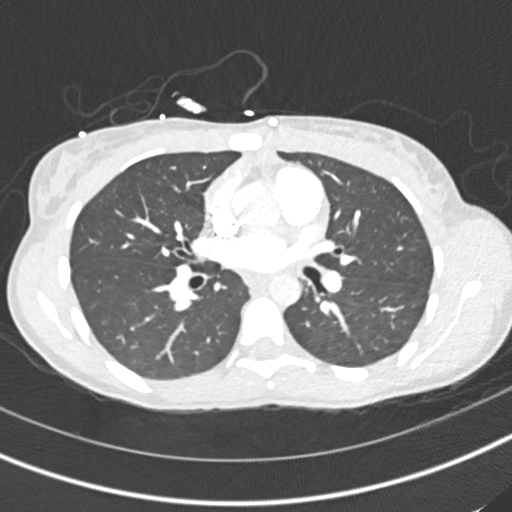
[im 283/509  mediastinal]
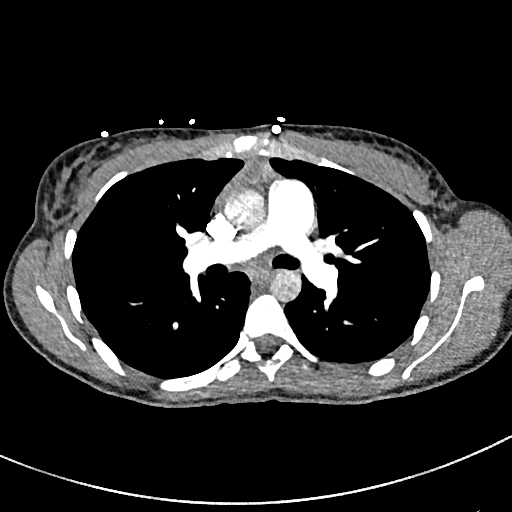
[im 311/509  lung]
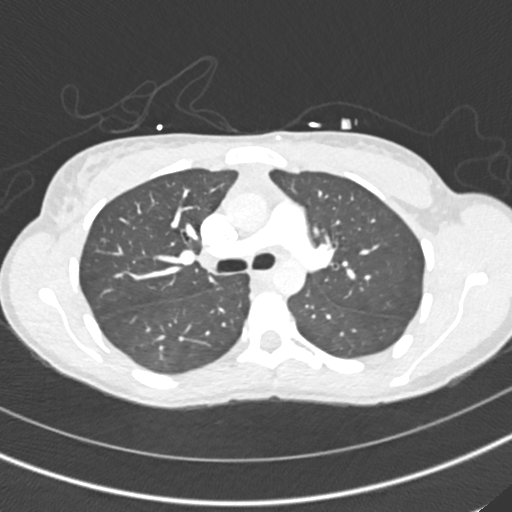
[im 339/509  mediastinal]
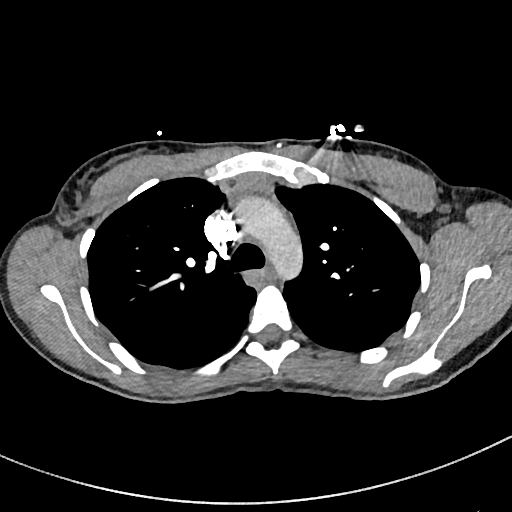
[im 367/509  lung]
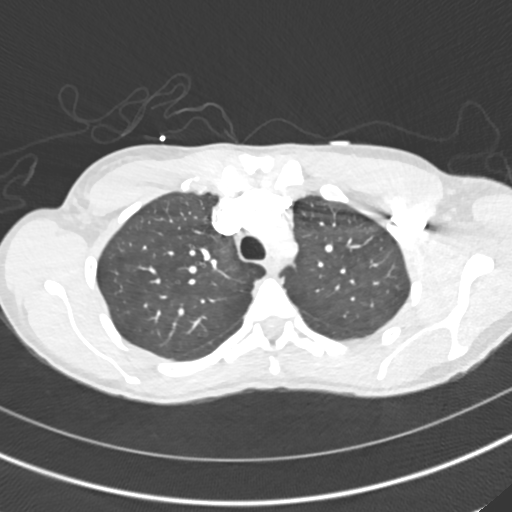
[im 396/509  mediastinal]
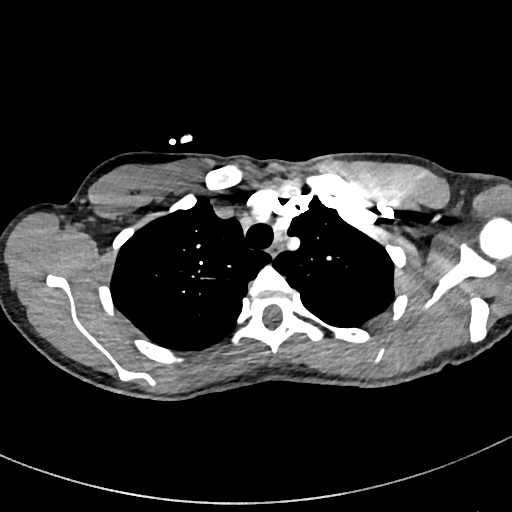
[im 424/509  lung]
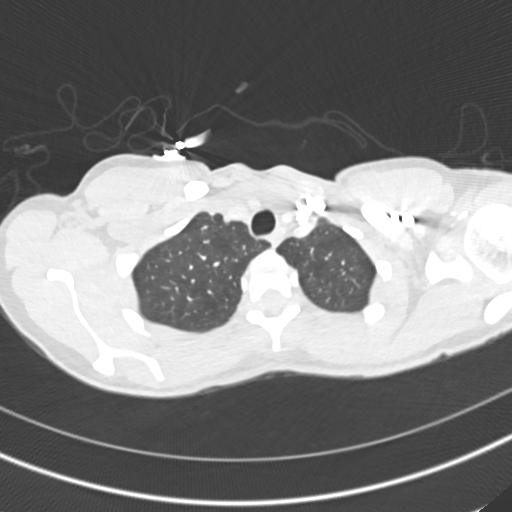
[im 452/509  mediastinal]
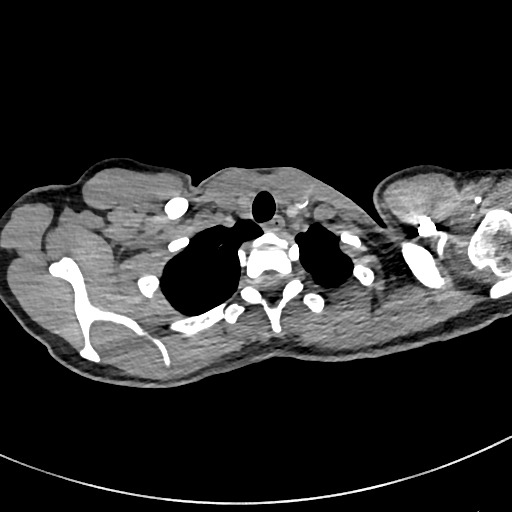
[im 480/509  lung]
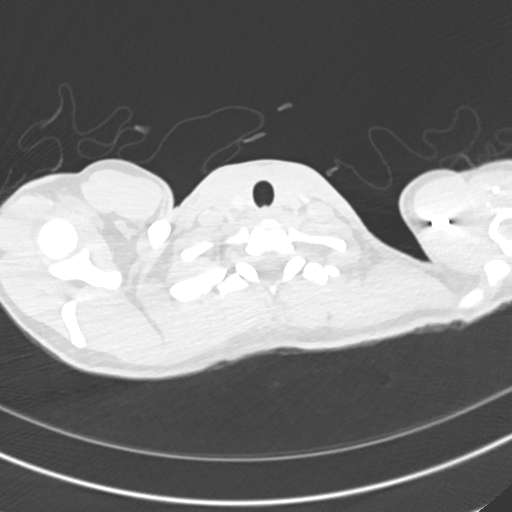

[Series 8: pe 2mm cor · coronal · 0.52mm/px · 1 of 97 slices shown]
[im 49/97  mediastinal]
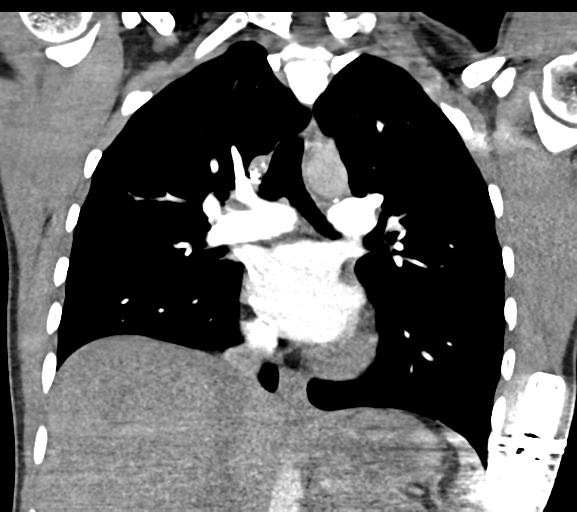

[18 of 36 positions shown; findings below may reference images not displayed]

FINDINGS: Mediastinum/Nodes: No filling defects within the pulmonary arteries
to suggest acute pulmonary embolism. No acute findings aorta great
vessels.

Small amount residual thymus in the anterior mediastinum.

No axillary supraclavicular adenopathy. No mediastinal adenopathy.
Esophagus normal.

Lungs/Pleura: Lungs are clear. No pulmonary infarction. No airspace
disease. No pleural fluid or pneumothorax.

Upper abdomen: Limited view of the liver, kidneys, pancreas are
unremarkable. Normal adrenal glands.

Musculoskeletal: There is a ICD device in the LEFT chest wall
laterally. Electrode along the sternum in the subcutaneous tissue.

Review of the MIP images confirms the above findings.
IMPRESSION: 1. No pulmonary embolism.
2. No acute pulmonary parenchymal findings or cardiac findings.

## 2017-03-25 ENCOUNTER — Telehealth: Payer: Self-pay | Admitting: Internal Medicine

## 2017-03-25 NOTE — Telephone Encounter (Signed)
Walk In pt Form-[employer] paperwork dropped off. Placed in [employer].

## 2017-04-13 ENCOUNTER — Telehealth: Payer: Self-pay

## 2017-04-13 NOTE — Telephone Encounter (Signed)
Attempted to call Pt, Pt number on file incorrect.  Call placed to grandmother per DPR.  Notified that Pt paperwork she had left to be filled out by Dr. Ladona Ridgelaylor was completed and at the front desk ready for pick up. Left this nurse name and # if any questions/concerns.

## 2017-05-25 NOTE — Progress Notes (Signed)
Electrophysiology Office Note Date: 05/26/2017  ID:  Courtney Rice, DOB 1991/06/09, MRN [id number]  PCP: Kathreen Cosier, MD (Inactive) Electrophysiologist: Ladona Ridgel  CC: ICD follow up  Courtney Rice is a 26 y.o. female seen today for Dr Ladona Ridgel.  She presents today for routine EP follow up. Since last being seen in clinic, the patient reports doing very well. Her biggest complaint is daily headaches. She denies chest pain, palpitations, dyspnea, PND, orthopnea, nausea, vomiting, dizziness, syncope, edema, weight gain, or early satiety.  She has not had ICD shocks.    Device History: BSX S-ICD implanted 2015 for long QT, PMVT History of appropriate therapy: No History of AAD therapy: No   Past Medical History:  Diagnosis Date  . AICD (automatic cardioverter/defibrillator) present   . Long Q-T syndrome Type 2    a. post partum syncope and VT PM;  b. 11/2013 s/p BSX SubQ ICD placement.  . Migraine   . Pre-syncope admitted 08/01/2015   a. felt to be 2/2 orthostatic hypotension - propranolol decreased.    Past Surgical History:  Procedure Laterality Date  . IMPLANTABLE CARDIOVERTER DEFIBRILLATOR IMPLANT  12-20-2013   BSX SubQ ICD implanted by Dr Graciela Husbands  . IMPLANTABLE CARDIOVERTER DEFIBRILLATOR IMPLANT N/A 12/20/2013   Procedure: SUB Q ICD;  Surgeon: Duke Salvia, MD;  Location: [facility] CATH LAB;  Service: Cardiovascular;  Laterality: N/A;  . TONSILLECTOMY AND ADENOIDECTOMY  2011    Current Outpatient Prescriptions  Medication Sig Dispense Refill  . acetaminophen (TYLENOL) 325 MG tablet Take 650 mg by mouth every 6 (six) hours as needed for mild pain, moderate pain, fever or headache.    . propranolol (INDERAL) 20 MG tablet Take 1 tablet (20 mg total) by mouth 2 (two) times daily. 60 tablet 6   No current facility-administered medications for this visit.     Allergies:   Patient has no known allergies.   Social History: Social History   Social History  . Marital  status: Single    Spouse name: N/A  . Number of children: N/A  . Years of education: N/A   Occupational History  . Not on file.   Social History Main Topics  . Smoking status: Never Smoker  . Smokeless tobacco: Never Used  . Alcohol use No  . Drug use: No  . Sexual activity: Yes     Comment: post partum   Other Topics Concern  . Not on file   Social History Narrative  . No narrative on file    Family History: Family History  Problem Relation Age of Onset  . Heart disease Mother   . Diabetes Mother   . Asthma Mother   . COPD Mother   . Lupus Mother   . Stroke Mother   . Hypertension Mother   . COPD Maternal Grandmother   . Heart disease Maternal Grandmother   . Hearing loss Neg Hx   . Heart attack Neg Hx     Review of Systems: All other systems reviewed and are otherwise negative except as noted above.   Physical Exam: VS:  BP 100/82   Pulse 84   Ht 5\' 4"  (1.626 m)   Wt 126 lb 3.2 oz (57.2 kg)   BMI 21.66 kg/m  , BMI Body mass index is 21.66 kg/m.  GEN- The patient is well appearing, alert and oriented x 3 today.   HEENT: normocephalic, atraumatic; sclera clear, conjunctiva pink; hearing intact; oropharynx clear; neck supple  Lungs- Clear to  ausculation bilaterally, normal work of breathing.  No wheezes, rales, rhonchi Heart- Regular rate and rhythm, no murmurs, rubs or gallops   GI- soft, non-tender, non-distended, bowel sounds present Extremities- no clubbing, cyanosis, or edema; DP/PT/radial pulses 2+ bilaterally MS- no significant deformity or atrophy Skin- warm and dry, no rash or lesion; ICD pocket well healed Psych- euthymic mood, full affect Neuro- strength and sensation are intact  ICD interrogation- reviewed in detail today,  See PACEART report  EKG:  EKG is ordered today. EKG today demonstrates sinus rhythm, rate 84, QTc 486msec  Recent Labs: No results found for requested labs within last 8760 hours.   Wt Readings from Last 3  Encounters:  05/26/17 126 lb 3.2 oz (57.2 kg)  05/04/16 118 lb 3.2 oz (53.6 kg)  08/16/15 120 lb (54.4 kg)     Other studies Reviewed: Additional studies/ records that were reviewed today include: hospital records  Assessment and Plan:  1.  Long QT syndrome Continue BB Keep K>3.9, Mg >1.8 (BMET, Mg today) Avoid QT prolonging drugs  Normal device function See PaceArt report for full details    Current medicines are reviewed at length with the patient today.   The patient does not have concerns regarding her medicines.  The following changes were made today:  none  Labs/ tests ordered today include: BMET, Mg  Disposition:   Follow up with [facility], Dr Ladona Ridgelaylor 1 year    Signed, Gypsy BalsamAmber Kihanna Kamiya, NP 05/26/2017 9:55 AM  [facility] [address] [place] [address] [phone] (office) [phone] (fax)

## 2017-05-26 ENCOUNTER — Ambulatory Visit (INDEPENDENT_AMBULATORY_CARE_PROVIDER_SITE_OTHER): Payer: BLUE CROSS/BLUE SHIELD | Admitting: Nurse Practitioner

## 2017-05-26 ENCOUNTER — Encounter: Payer: Self-pay | Admitting: Nurse Practitioner

## 2017-05-26 VITALS — BP 100/82 | HR 84 | Ht 64.0 in | Wt 126.2 lb

## 2017-05-26 DIAGNOSIS — I4581 Long QT syndrome: Secondary | ICD-10-CM | POA: Diagnosis not present

## 2017-05-26 LAB — CUP PACEART INCLINIC DEVICE CHECK
Date Time Interrogation Session: 20181025091507
Implantable Lead Location: 753862
Implantable Pulse Generator Implant Date: 20150521
MDC IDC LEAD IMPLANT DT: 20150521
Pulse Gen Model: 1010

## 2017-05-26 LAB — BASIC METABOLIC PANEL
BUN/Creatinine Ratio: 10 (ref 9–23)
BUN: 7 mg/dL (ref 6–20)
CO2: 27 mmol/L (ref 20–29)
Calcium: 9.6 mg/dL (ref 8.7–10.2)
Chloride: 100 mmol/L (ref 96–106)
Creatinine, Ser: 0.71 mg/dL (ref 0.57–1.00)
GFR calc Af Amer: 137 mL/min/{1.73_m2} (ref >59)
GFR calc non Af Amer: 119 mL/min/{1.73_m2} (ref >59)
GLUCOSE: 85 mg/dL (ref 65–99)
POTASSIUM: 4.3 mmol/L (ref 3.5–5.2)
SODIUM: 139 mmol/L (ref 134–144)

## 2017-05-26 LAB — MAGNESIUM: Magnesium: 2 mg/dL (ref 1.6–2.3)

## 2017-05-26 NOTE — Patient Instructions (Signed)
Medication Instructions:   Your physician recommends that you continue on your current medications as directed. Please refer to the Current Medication list given to you today.   If you need a refill on your cardiac medications before your next appointment, please call your pharmacy.  Labwork: BMET AND MAG TODAY    Testing/Procedures: NONE ORDERED  TODAY    Follow-Up: Your physician wants you to follow-up in: ONE YEAR WITH  Courtney Rice  You will receive a reminder letter in the mail two months in advance. If you don't receive a letter, please call our office to schedule the follow-up appointment.     Any Other Special Instructions Will Be Listed Below (If Applicable).

## 2017-09-08 ENCOUNTER — Other Ambulatory Visit: Payer: Self-pay | Admitting: Internal Medicine

## 2017-09-08 DIAGNOSIS — I4581 Long QT syndrome: Secondary | ICD-10-CM

## 2017-09-08 MED ORDER — PROPRANOLOL HCL 20 MG PO TABS: 20.0000 mg | ORAL_TABLET | Freq: Two times a day (BID) | ORAL | 8 refills | Status: DC

## 2017-09-08 NOTE — Telephone Encounter (Signed)
Pt's medication was sent to pt's pharmacy as requested. Confirmation received.  °

## 2017-09-20 ENCOUNTER — Encounter: Payer: Self-pay | Admitting: Obstetrics & Gynecology

## 2017-09-20 ENCOUNTER — Ambulatory Visit (INDEPENDENT_AMBULATORY_CARE_PROVIDER_SITE_OTHER): Payer: BLUE CROSS/BLUE SHIELD | Admitting: Obstetrics & Gynecology

## 2017-09-20 VITALS — BP 112/78 | HR 81 | Ht 64.0 in | Wt 128.0 lb

## 2017-09-20 DIAGNOSIS — Z01419 Encounter for gynecological examination (general) (routine) without abnormal findings: Secondary | ICD-10-CM

## 2017-09-20 MED ORDER — DROSPIRENONE-ETHINYL ESTRADIOL 3-0.02 MG PO TABS: 1.0000 | ORAL_TABLET | Freq: Every day | ORAL | 11 refills | Status: DC

## 2017-09-20 NOTE — Progress Notes (Signed)
Patient ID: Courtney Rice, female   DOB: [birth year], 27 y.o.   MRN: [id number]  Chief Complaint  Patient presents with  . Gynecologic Exam    HPI Courtney Rice is a 27 y.o. female.  G1P1001 Patient's last menstrual period was 08/17/2017 (approximate). She was a patient of Dr Gaynell Face who last did a pap 2017, but the record is not available. Sexually active without BCM. She requests a prescription for OCP.and STD screening. HPI  Past Medical History:  Diagnosis Date  . AICD (automatic cardioverter/defibrillator) present   . Long Q-T syndrome Type 2    a. post partum syncope and VT PM;  b. 11/2013 s/p BSX SubQ ICD placement.  . Migraine   . Pre-syncope admitted 08/01/2015   a. felt to be 2/2 orthostatic hypotension - propranolol decreased.     Past Surgical History:  Procedure Laterality Date  . IMPLANTABLE CARDIOVERTER DEFIBRILLATOR IMPLANT  12-20-2013   BSX SubQ ICD implanted by Dr Graciela Husbands  . IMPLANTABLE CARDIOVERTER DEFIBRILLATOR IMPLANT N/A 12/20/2013   Procedure: SUB Q ICD;  Surgeon: Duke Salvia, MD;  Location[facility] CATH LAB;  Service: Cardiovascular;  Laterality: N/A;  . TONSILLECTOMY AND ADENOIDECTOMY  2011    Family History  Problem Relation Age of Onset  . Heart disease Mother   . Diabetes Mother   . Asthma Mother   . COPD Mother   . Lupus Mother   . Stroke Mother   . Hypertension Mother   . COPD Maternal Grandmother   . Heart disease Maternal Grandmother   . Breast cancer Maternal Aunt   . Hearing loss Neg Hx   . Heart attack Neg Hx     Social History Social History   Tobacco Use  . Smoking status: Never Smoker  . Smokeless tobacco: Never Used  Substance Use Topics  . Alcohol use: No  . Drug use: No    No Known Allergies  Current Outpatient Medications  Medication Sig Dispense Refill  . acetaminophen (TYLENOL) 325 MG tablet Take 650 mg by mouth every 6 (six) hours as needed for mild pain, moderate pain, fever or headache.    . propranolol  (INDERAL) 20 MG tablet Take 1 tablet (20 mg total) by mouth 2 (two) times daily. 60 tablet 8   No current facility-administered medications for this visit.     Review of Systems Review of Systems  Respiratory: Negative.   Cardiovascular: Positive for palpitations (occasional).  Gastrointestinal: Negative.   Genitourinary: Negative for menstrual problem, pelvic pain, vaginal bleeding and vaginal discharge.    Blood pressure 112/78, pulse 81, height 5\' 4"  (1.626 m), weight 128 lb (58.1 kg), last menstrual period 08/17/2017.  Physical Exam Physical Exam  Constitutional: She is oriented to person, place, and time. She appears well-developed. No distress.  HENT:  Head: Normocephalic.  Eyes: Pupils are equal, round, and reactive to light.  Neck: No thyromegaly present.  Cardiovascular: Normal rate and normal heart sounds.  Pulmonary/Chest: Effort normal and breath sounds normal. No respiratory distress.  Abdominal: Soft. She exhibits no distension. There is no tenderness.  Genitourinary: Vagina normal and uterus normal. No vaginal discharge found.  Genitourinary Comments: No masses  Neurological: She is alert and oriented to person, place, and time.  Skin: Skin is warm and dry.  Psychiatric: She has a normal mood and affect. Her behavior is normal.  Vitals reviewed.  Breasts: breasts appear normal, no suspicious masses, no skin or nipple changes or axillary nodes.   Data Reviewed Pap  01/2015  Assessment    Well woman exam Prescription for OCP    Patient Active Problem List   Diagnosis Date Noted  . Dizziness 08/01/2015  . Episode of altered consciousness 01/23/2014  . Polymorphic ventricular tachycardia (HCC) 11/30/2013  . Syncope 06/06/2013  . Long Q-T syndrome 06/06/2013     Plan    Yaz prescribed Repeat pap 3 years if normal Orders Placed This Encounter  Procedures  . Hepatitis B surface antigen  . HIV antibody  . RPR  . Hepatitis C antibody           Scheryl DarterJames Kimora Stankovic 09/20/2017, 3:21 PM

## 2017-09-20 NOTE — Patient Instructions (Addendum)
Contraception Choices Contraception, also called birth control, refers to methods or devices that prevent pregnancy. Hormonal methods Contraceptive implant A contraceptive implant is a thin, plastic tube that contains a hormone. It is inserted into the upper part of the arm. It can remain in place for up to 3 years. Progestin-only injections Progestin-only injections are injections of progestin, a synthetic form of the hormone progesterone. They are given every 3 months by a health care provider. Birth control pills Birth control pills are pills that contain hormones that prevent pregnancy. They must be taken once a day, preferably at the same time each day. Birth control patch The birth control patch contains hormones that prevent pregnancy. It is placed on the skin and must be changed once a week for three weeks and removed on the fourth week. A prescription is needed to use this method of contraception. Vaginal ring A vaginal ring contains hormones that prevent pregnancy. It is placed in the vagina for three weeks and removed on the fourth week. After that, the process is repeated with a new ring. A prescription is needed to use this method of contraception. Emergency contraceptive Emergency contraceptives prevent pregnancy after unprotected sex. They come in pill form and can be taken up to 5 days after sex. They work best the sooner they are taken after having sex. Most emergency contraceptives are available without a prescription. This method should not be used as your only form of birth control. Barrier methods Female condom A female condom is a thin sheath that is worn over the penis during sex. Condoms keep sperm from going inside a woman's body. They can be used with a spermicide to increase their effectiveness. They should be disposed after a single use. Female condom A female condom is a soft, loose-fitting sheath that is put into the vagina before sex. The condom keeps sperm from going  inside a woman's body. They should be disposed after a single use. Diaphragm A diaphragm is a soft, dome-shaped barrier. It is inserted into the vagina before sex, along with a spermicide. The diaphragm blocks sperm from entering the uterus, and the spermicide kills sperm. A diaphragm should be left in the vagina for 6-8 hours after sex and removed within 24 hours. A diaphragm is prescribed and fitted by a health care provider. A diaphragm should be replaced every 1-2 years, after giving birth, after gaining more than 15 lb (6.8 kg), and after pelvic surgery. Cervical cap A cervical cap is a round, soft latex or plastic cup that fits over the cervix. It is inserted into the vagina before sex, along with spermicide. It blocks sperm from entering the uterus. The cap should be left in place for 6-8 hours after sex and removed within 48 hours. A cervical cap must be prescribed and fitted by a health care provider. It should be replaced every 2 years. Sponge A sponge is a soft, circular piece of polyurethane foam with spermicide on it. The sponge helps block sperm from entering the uterus, and the spermicide kills sperm. To use it, you make it wet and then insert it into the vagina. It should be inserted before sex, left in for at least 6 hours after sex, and removed and thrown away within 30 hours. Spermicides Spermicides are chemicals that kill or block sperm from entering the cervix and uterus. They can come as a cream, jelly, suppository, foam, or tablet. A spermicide should be inserted into the vagina with an applicator at least 10-15 minutes before   sex to allow time for it to work. The process must be repeated every time you have sex. Spermicides do not require a prescription. Intrauterine contraception Intrauterine device (IUD) An IUD is a T-shaped device that is put in a woman's uterus. There are two types:  Hormone IUD.This type contains progestin, a synthetic form of the hormone progesterone. This  type can stay in place for 3-5 years.  Copper IUD.This type is wrapped in copper wire. It can stay in place for 10 years.  Permanent methods of contraception Female tubal ligation In this method, a woman's fallopian tubes are sealed, tied, or blocked during surgery to prevent eggs from traveling to the uterus. Hysteroscopic sterilization In this method, a small, flexible insert is placed into each fallopian tube. The inserts cause scar tissue to form in the fallopian tubes and block them, so sperm cannot reach an egg. The procedure takes about 3 months to be effective. Another form of birth control must be used during those 3 months. Female sterilization This is a procedure to tie off the tubes that carry sperm (vasectomy). After the procedure, the man can still ejaculate fluid (semen). Natural planning methods Natural family planning In this method, a couple does not have sex on days when the woman could become pregnant. Calendar method This means keeping track of the length of each menstrual cycle, identifying the days when pregnancy can happen, and not having sex on those days. Ovulation method In this method, a couple avoids sex during ovulation. Symptothermal method This method involves not having sex during ovulation. The woman typically checks for ovulation by watching changes in her temperature and in the consistency of cervical mucus. Post-ovulation method In this method, a couple waits to have sex until after ovulation. Summary  Contraception, also called birth control, means methods or devices that prevent pregnancy.  Hormonal methods of contraception include implants, injections, pills, patches, vaginal rings, and emergency contraceptives.  Barrier methods of contraception can include female condoms, female condoms, diaphragms, cervical caps, sponges, and spermicides.  There are two types of IUDs (intrauterine devices). An IUD can be put in a woman's uterus to prevent pregnancy  for 3-5 years.  Permanent sterilization can be done through a procedure for males, females, or both.  Natural family planning methods involve not having sex on days when the woman could become pregnant. This information is not intended to replace advice given to you by your health care provider. Make sure you discuss any questions you have with your health care provider. Document Released: 07/19/2005 Document Revised: 08/21/2016 Document Reviewed: 08/21/2016 Elsevier Interactive Patient Education  2018 Elsevier Inc.  

## 2017-09-20 NOTE — Progress Notes (Signed)
LMP: unsure last month per pt  Mammogram: N/A  Last Pap: N/A Due today  Colonoscopy: N/A   Contraception: None. Considering BCPS .Has had unprotected intercourse x 14 days.  PCP: None   STD Screening: Desires Full panel  CC: vaginal discharge, white , itching.

## 2017-09-21 LAB — CERVICOVAGINAL ANCILLARY ONLY
Bacterial vaginitis: POSITIVE — AB
Candida vaginitis: NEGATIVE
Chlamydia: NEGATIVE
NEISSERIA GONORRHEA: NEGATIVE

## 2017-09-21 LAB — HEPATITIS C ANTIBODY: HEP C VIRUS AB: 0.1 {s_co_ratio} (ref 0.0–0.9)

## 2017-09-21 LAB — HEPATITIS B SURFACE ANTIGEN: HEP B S AG: NEGATIVE

## 2017-09-21 LAB — HIV ANTIBODY (ROUTINE TESTING W REFLEX): HIV SCREEN 4TH GENERATION: NONREACTIVE

## 2017-09-21 LAB — RPR: RPR Ser Ql: NONREACTIVE

## 2017-09-22 ENCOUNTER — Other Ambulatory Visit: Payer: Self-pay | Admitting: *Deleted

## 2017-09-22 DIAGNOSIS — N760 Acute vaginitis: Secondary | ICD-10-CM

## 2017-09-22 DIAGNOSIS — B9689 Other specified bacterial agents as the cause of diseases classified elsewhere: Secondary | ICD-10-CM

## 2017-09-22 MED ORDER — METRONIDAZOLE 500 MG PO TABS: 500.0000 mg | ORAL_TABLET | Freq: Two times a day (BID) | ORAL | 0 refills | Status: DC

## 2017-09-22 NOTE — Progress Notes (Signed)
Pt called to office for recent results. Pt is +BV, Flagyl was sent to pharmacy

## 2017-09-23 ENCOUNTER — Other Ambulatory Visit: Payer: Self-pay | Admitting: Obstetrics & Gynecology

## 2017-09-23 LAB — CYTOLOGY - PAP

## 2017-09-28 ENCOUNTER — Telehealth: Payer: Self-pay

## 2017-09-28 NOTE — Telephone Encounter (Signed)
-----   Message from Adam PhenixJames G Arnold, MD sent at 09/28/2017  1:28 PM EST ----- LSIL, needs colposcopy

## 2017-09-28 NOTE — Telephone Encounter (Signed)
Left VM message to call office.

## 2017-09-30 ENCOUNTER — Telehealth: Payer: Self-pay

## 2017-09-30 NOTE — Telephone Encounter (Signed)
Notified patient of results and need for COLPO, offerred to make an appt, but she said that she will call back to make the the appt.

## 2018-03-14 ENCOUNTER — Ambulatory Visit (INDEPENDENT_AMBULATORY_CARE_PROVIDER_SITE_OTHER): Payer: Self-pay | Admitting: *Deleted

## 2018-03-14 DIAGNOSIS — I472 Ventricular tachycardia: Secondary | ICD-10-CM

## 2018-03-14 DIAGNOSIS — I4729 Other ventricular tachycardia: Secondary | ICD-10-CM
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
## 2018-04-17 ENCOUNTER — Other Ambulatory Visit: Payer: Self-pay | Admitting: Internal Medicine

## 2018-05-02 ENCOUNTER — Other Ambulatory Visit: Payer: Self-pay | Admitting: *Deleted

## 2018-05-02 DIAGNOSIS — N760 Acute vaginitis: Secondary | ICD-10-CM

## 2018-05-02 DIAGNOSIS — B9689 Other specified bacterial agents as the cause of diseases classified elsewhere: Secondary | ICD-10-CM

## 2018-05-02 MED ORDER — METRONIDAZOLE 500 MG PO TABS: 500.0000 mg | ORAL_TABLET | Freq: Two times a day (BID) | ORAL | 0 refills | Status: DC

## 2018-05-02 NOTE — Progress Notes (Signed)
Pt called to office with symptoms of BV, ask for tx. Flagyl sent to pharmacy per protocol. Pt advised if no change in symptoms after tx call for an appt.

## 2018-06-02 ENCOUNTER — Encounter: Payer: Self-pay | Admitting: Internal Medicine

## 2018-07-07 ENCOUNTER — Encounter: Payer: Self-pay | Admitting: Obstetrics & Gynecology

## 2018-07-12 ENCOUNTER — Other Ambulatory Visit (HOSPITAL_COMMUNITY)
Admission: RE | Admit: 2018-07-12 | Discharge: 2018-07-12 | Disposition: A | Discharge status: Home / Self Care | Payer: BLUE CROSS/BLUE SHIELD | Source: Ambulatory Visit | Attending: Obstetrics & Gynecology | Admitting: Obstetrics & Gynecology

## 2018-07-12 ENCOUNTER — Ambulatory Visit: Payer: BLUE CROSS/BLUE SHIELD | Admitting: Obstetrics & Gynecology

## 2018-07-12 ENCOUNTER — Encounter: Payer: Self-pay | Admitting: Obstetrics & Gynecology

## 2018-07-12 DIAGNOSIS — Z3202 Encounter for pregnancy test, result negative: Secondary | ICD-10-CM

## 2018-07-12 DIAGNOSIS — R87612 Low grade squamous intraepithelial lesion on cytologic smear of cervix (LGSIL): Secondary | ICD-10-CM | POA: Diagnosis not present

## 2018-07-12 DIAGNOSIS — D069 Carcinoma in situ of cervix, unspecified: Secondary | ICD-10-CM

## 2018-07-12 LAB — POCT URINE PREGNANCY: PREG TEST UR: NEGATIVE

## 2018-07-12 NOTE — Progress Notes (Signed)
Presents for COLPO.  UPT today is NEGATIVE. Had sex 1 week ago.  Patient  requested STD Testing

## 2018-07-12 NOTE — Patient Instructions (Signed)
Colposcopy, Care After  This sheet gives you information about how to care for yourself after your procedure. Your doctor may also give you more specific instructions. If you have problems or questions, contact your doctor.  What can I expect after the procedure?  If you did not have a tissue sample removed (did not have a biopsy), you may only have some spotting for a few days. You can go back to your normal activities.  If you had a tissue sample removed, it is common to have:  · Soreness and pain. This may last for a few days.  · Light-headedness.  · Mild bleeding from your vagina or dark-colored, grainy discharge from your vagina. This may last for a few days. You may need to wear a sanitary pad.  · Spotting for at least 48 hours after the procedure.    Follow these instructions at home:  · Take over-the-counter and prescription medicines only as told by your doctor. Ask your doctor what medicines you can start taking again. This is very important if you take blood-thinning medicine.  · Do not drive or use heavy machinery while taking prescription pain medicine.  · For 3 days, or as long as your doctor tells you, avoid:  ? Douching.  ? Using tampons.  ? Having sex.  · If you use birth control (contraception), keep using it.  · Limit activity for the first day after the procedure. Ask your doctor what activities are safe for you.  · It is up to you to get the results of your procedure. Ask your doctor when your results will be ready.  · Keep all follow-up visits as told by your doctor. This is important.  Contact a doctor if:  · You get a skin rash.  Get help right away if:  · You are bleeding a lot from your vagina. It is a lot of bleeding if you are using more than one pad an hour for 2 hours in a row.  · You have clumps of blood (blood clots) coming from your vagina.  · You have a fever.  · You have chills  · You have pain in your lower belly (pelvic area).  · You have signs of infection, such as vaginal  discharge that is:  ? Different than usual.  ? Yellow.  ? Bad-smelling.  · You have very pain or cramps in your lower belly that do not get better with medicine.  · You feel light-headed.  · You feel dizzy.  · You pass out (faint).  Summary  · If you did not have a tissue sample removed (did not have a biopsy), you may only have some spotting for a few days. You can go back to your normal activities.  · If you had a tissue sample removed, it is common to have mild pain and spotting for 48 hours.  · For 3 days, or as long as your doctor tells you, avoid douching, using tampons and having sex.  · Get help right away if you have bleeding, very bad pain, or signs of infection.  This information is not intended to replace advice given to you by your health care provider. Make sure you discuss any questions you have with your health care provider.  Document Released: 01/05/2008 Document Revised: 04/07/2016 Document Reviewed: 04/07/2016  Elsevier Interactive Patient Education © 2018 Elsevier Inc.

## 2018-07-12 NOTE — Progress Notes (Signed)
Patient ID: Courtney Rice, female   DOB: [birth year], 27 y.o.   MRN: [id number]  Chief Complaint  Patient presents with  . Colposcopy    HPI Courtney Rice is a 27 y.o. female.  G1P1001 Patient's last menstrual period was 06/21/2018 (approximate).  HPI  Indications: Pap smear on February 2019 showed: low-grade squamous intraepithelial neoplasia (LGSIL - encompassing HPV,mild dysplasia,CIN I). Previous colposcopy: none. Prior cervical treatment: no treatment.  Past Medical History:  Diagnosis Date  . AICD (automatic cardioverter/defibrillator) present   . Long Q-T syndrome Type 2    a. post partum syncope and VT PM;  b. 11/2013 s/p BSX SubQ ICD placement.  . Migraine   . Pre-syncope admitted 08/01/2015   a. felt to be 2/2 orthostatic hypotension - propranolol decreased.     Past Surgical History:  Procedure Laterality Date  . IMPLANTABLE CARDIOVERTER DEFIBRILLATOR IMPLANT  12-20-2013   BSX SubQ ICD implanted by Dr Graciela Husbands  . IMPLANTABLE CARDIOVERTER DEFIBRILLATOR IMPLANT N/A 12/20/2013   Procedure: SUB Q ICD;  Surgeon: Duke Salvia, MD;  Location: [facility] CATH LAB;  Service: Cardiovascular;  Laterality: N/A;  . TONSILLECTOMY AND ADENOIDECTOMY  2011    Family History  Problem Relation Age of Onset  . Heart disease Mother   . Diabetes Mother   . Asthma Mother   . COPD Mother   . Lupus Mother   . Stroke Mother   . Hypertension Mother   . COPD Maternal Grandmother   . Heart disease Maternal Grandmother   . Breast cancer Maternal Aunt   . Hearing loss Neg Hx   . Heart attack Neg Hx     Social History Social History   Tobacco Use  . Smoking status: Never Smoker  . Smokeless tobacco: Never Used  Substance Use Topics  . Alcohol use: No  . Drug use: No    No Known Allergies  Current Outpatient Medications  Medication Sig Dispense Refill  . acetaminophen (TYLENOL) 325 MG tablet Take 650 mg by mouth every 6 (six) hours as needed for mild pain, moderate pain, fever or  headache.    . drospirenone-ethinyl estradiol (YAZ) 3-0.02 MG tablet Take 1 tablet by mouth daily. (Patient not taking: Reported on 07/12/2018) 1 Package 11  . metroNIDAZOLE (FLAGYL) 500 MG tablet Take 1 tablet (500 mg total) by mouth 2 (two) times daily. (Patient not taking: Reported on 07/12/2018) 14 tablet 0  . propranolol (INDERAL) 20 MG tablet Take 1 tablet (20 mg total) by mouth 2 (two) times daily. (Patient not taking: Reported on 07/12/2018) 60 tablet 8   No current facility-administered medications for this visit.     Review of Systems Review of Systems  Constitutional: Negative.   Gastrointestinal: Negative.   Genitourinary: Negative.     Blood pressure 123/77, pulse 85, height 5\' 4"  (1.626 m), weight 56.1 kg, last menstrual period 06/21/2018.  Physical Exam Physical Exam  Constitutional: She appears well-developed. No distress.  Pulmonary/Chest: Effort normal.  Genitourinary: Vagina normal. No vaginal discharge found.  Vitals reviewed.   Data Reviewed Pap result  Assessment    Procedure Details  The risks and benefits of the procedure and Written informed consent obtained.  Speculum placed in vagina and excellent visualization of cervix achieved, cervix swabbed x 3 with acetic acid solution. AWE anterior and posterior with punctation, TZ and SCJ seen, no endocervical lesion Specimens: ECC, bx at 12 and 6   Complications: none.     Plan    Specimens labelled and  sent to Pathology. Call to discuss Pathology results in 2 weeks.      Scheryl DarterJames Carole Rice 07/12/2018, 3:59 PM

## 2018-07-14 LAB — CERVICOVAGINAL ANCILLARY ONLY
CHLAMYDIA, DNA PROBE: NEGATIVE
NEISSERIA GONORRHEA: NEGATIVE

## 2018-08-07 ENCOUNTER — Telehealth: Payer: Self-pay | Admitting: *Deleted

## 2018-08-07 ENCOUNTER — Other Ambulatory Visit: Payer: Self-pay

## 2018-08-07 DIAGNOSIS — B9689 Other specified bacterial agents as the cause of diseases classified elsewhere: Secondary | ICD-10-CM

## 2018-08-07 DIAGNOSIS — N760 Acute vaginitis: Secondary | ICD-10-CM

## 2018-08-07 NOTE — Telephone Encounter (Signed)
Pt states she would like refill on Flagyl. Pt states same symptoms as before, not giving any other details. Pt had negative results on 07/12/18.  Made pt aware that request to be sent to provider.  Pt uses [facility] on [address]

## 2018-08-08 NOTE — Telephone Encounter (Signed)
Please review for refill.  

## 2018-08-09 MED ORDER — METRONIDAZOLE 500 MG PO TABS: 500.0000 mg | ORAL_TABLET | Freq: Two times a day (BID) | ORAL | 0 refills | Status: DC

## 2018-08-10 ENCOUNTER — Other Ambulatory Visit: Payer: Self-pay | Admitting: Obstetrics & Gynecology

## 2018-08-10 DIAGNOSIS — B9689 Other specified bacterial agents as the cause of diseases classified elsewhere: Secondary | ICD-10-CM

## 2018-08-10 DIAGNOSIS — N760 Acute vaginitis: Secondary | ICD-10-CM

## 2018-08-10 MED ORDER — METRONIDAZOLE 500 MG PO TABS: 500.0000 mg | ORAL_TABLET | Freq: Two times a day (BID) | ORAL | 0 refills | Status: DC

## 2018-10-11 ENCOUNTER — Other Ambulatory Visit: Payer: Self-pay

## 2018-10-11 DIAGNOSIS — N760 Acute vaginitis: Secondary | ICD-10-CM

## 2018-10-11 DIAGNOSIS — B9689 Other specified bacterial agents as the cause of diseases classified elsewhere: Secondary | ICD-10-CM

## 2018-10-12 ENCOUNTER — Ambulatory Visit: Payer: Managed Care, Other (non HMO) | Admitting: Internal Medicine

## 2018-10-12 ENCOUNTER — Other Ambulatory Visit: Payer: Self-pay

## 2018-10-12 ENCOUNTER — Encounter: Payer: Self-pay | Admitting: Internal Medicine

## 2018-10-12 VITALS — BP 108/70 | HR 72 | Ht 64.0 in | Wt 122.6 lb

## 2018-10-12 DIAGNOSIS — I472 Ventricular tachycardia: Secondary | ICD-10-CM | POA: Diagnosis not present

## 2018-10-12 DIAGNOSIS — I4729 Other ventricular tachycardia: Secondary | ICD-10-CM

## 2018-10-12 DIAGNOSIS — I4581 Long QT syndrome: Secondary | ICD-10-CM

## 2018-10-12 LAB — CUP PACEART INCLINIC DEVICE CHECK
Date Time Interrogation Session: 20200312181948
Implantable Lead Implant Date: 20150521
Implantable Lead Location: 753862
Implantable Lead Model: 3400
Implantable Pulse Generator Implant Date: 20150521
Pulse Gen Model: 1010

## 2018-10-12 NOTE — Patient Instructions (Signed)
Medication Instructions:  No changes If you need a refill on your cardiac medications before your next appointment, please call your pharmacy.   Lab work: none If you have labs (blood work) drawn today and your tests are completely normal, you will receive your results only by: Marland Kitchen MyChart Message (if you have MyChart) OR . A paper copy in the mail If you have any lab test that is abnormal or we need to change your treatment, we will call you to review the results.  Testing/Procedures: none  Follow-Up: At [facility], you and your health needs are our priority.  As part of our continuing mission to provide you with exceptional heart care, we have created designated Provider Care Teams.  These Care Teams include your primary Cardiologist (physician) and Advanced Practice Providers (APPs -  Physician Assistants and Nurse Practitioners) who all work together to provide you with the care you need, when you need it. You will need a follow up appointment in 12 months.  Please call our office 2 months in advance to schedule this appointment.  You may see Lewayne Bunting, MD or one of the following Advanced Practice Providers on your designated Care Team:   Gypsy Balsam, NP . Francis Dowse, PA-C  Any Other Special Instructions Will Be Listed Below (If Applicable). GI Doctors: Erick Blinks, - [facility] Charlott Rakes - [facility] Scarlette Slice [phone]

## 2018-10-12 NOTE — Progress Notes (Signed)
HPI Courtney Rice returns today for followup of her long QT syndrome. She has done well from a cardiac perspective but c/o constipation and abdominal pain. She has not lost weight but notes that her appetite may be down a bit. She denies fever or diarrhea but has had some constipation. The pain is fleeting.  No Known Allergies   Current Outpatient Medications  Medication Sig Dispense Refill  . acetaminophen (TYLENOL) 325 MG tablet Take 650 mg by mouth every 6 (six) hours as needed for mild pain, moderate pain, fever or headache.    . drospirenone-ethinyl estradiol (YAZ) 3-0.02 MG tablet Take 1 tablet by mouth daily. (Patient not taking: Reported on 10/12/2018) 1 Package 11  . metroNIDAZOLE (FLAGYL) 500 MG tablet Take 1 tablet (500 mg total) by mouth 2 (two) times daily. (Patient not taking: Reported on 10/12/2018) 14 tablet 0  . propranolol (INDERAL) 20 MG tablet Take 1 tablet (20 mg total) by mouth 2 (two) times daily. (Patient not taking: Reported on 10/12/2018) 60 tablet 8   No current facility-administered medications for this visit.      Past Medical History:  Diagnosis Date  . AICD (automatic cardioverter/defibrillator) present   . Long Q-T syndrome Type 2    a. post partum syncope and VT PM;  b. 11/2013 s/p BSX SubQ ICD placement.  . Migraine   . Pre-syncope admitted 08/01/2015   a. felt to be 2/2 orthostatic hypotension - propranolol decreased.     ROS:   All systems reviewed and negative except as noted in the HPI.   Past Surgical History:  Procedure Laterality Date  . IMPLANTABLE CARDIOVERTER DEFIBRILLATOR IMPLANT  12-20-2013   BSX SubQ ICD implanted by Dr Graciela Husbands  . IMPLANTABLE CARDIOVERTER DEFIBRILLATOR IMPLANT N/A 12/20/2013   Procedure: SUB Q ICD;  Surgeon: Duke Salvia, MD;  Location: [facility] CATH LAB;  Service: Cardiovascular;  Laterality: N/A;  . TONSILLECTOMY AND ADENOIDECTOMY  2011     Family History  Problem Relation Age of Onset  . Heart disease Mother   .  Diabetes Mother   . Asthma Mother   . COPD Mother   . Lupus Mother   . Stroke Mother   . Hypertension Mother   . COPD Maternal Grandmother   . Heart disease Maternal Grandmother   . Breast cancer Maternal Aunt   . Hearing loss Neg Hx   . Heart attack Neg Hx      Social History   Socioeconomic History  . Marital status: Single    Spouse name: Not on file  . Number of children: Not on file  . Years of education: Not on file  . Highest education level: Not on file  Occupational History  . Not on file  Social Needs  . Financial resource strain: Not on file  . Food insecurity:    Worry: Not on file    Inability: Not on file  . Transportation needs:    Medical: Not on file    Non-medical: Not on file  Tobacco Use  . Smoking status: Never Smoker  . Smokeless tobacco: Never Used  Substance and Sexual Activity  . Alcohol use: No  . Drug use: No  . Sexual activity: Yes    Partners: Male    Birth control/protection: Condom  Lifestyle  . Physical activity:    Days per week: Not on file    Minutes per session: Not on file  . Stress: Not on file  Relationships  .  Social connections:    Talks on phone: Not on file    Gets together: Not on file    Attends religious service: Not on file    Active member of club or organization: Not on file    Attends meetings of clubs or organizations: Not on file    Relationship status: Not on file  . Intimate partner violence:    Fear of current or ex partner: Not on file    Emotionally abused: Not on file    Physically abused: Not on file    Forced sexual activity: Not on file  Other Topics Concern  . Not on file  Social History Narrative  . Not on file     BP 108/70   Pulse 72   Ht 5\' 4"  (1.626 m)   Wt 122 lb 9.6 oz (55.6 kg)   SpO2 99%   BMI 21.04 kg/m   Physical Exam:  Well appearing NAD HEENT: Unremarkable Neck:  No JVD, no thyromegally Lymphatics:  No adenopathy Back:  No CVA tenderness Lungs:  Clear with no  wheezes HEART:  Regular rate rhythm, no murmurs, no rubs, no clicks Abd:  soft, positive bowel sounds, no organomegally, no rebound, no guarding Ext:  2 plus pulses, no edema, no cyanosis, no clubbing Skin:  No rashes no nodules Neuro:  CN II through XII intact, motor grossly intact  EKG - nsr with long QT.   DEVICE  Normal device function.  See PaceArt for details.   Assess/Plan: 1. Long QT with VT (during pregnancy) - the patient is asymptomatic. Her ECG is about the same. She will continue propranolol. 2. VT - she has had no PMVT since her last visit. 3. ICD - her [place] S-ICD appears to be working normally. She has 40% of her battery remaining.  4. Constipation and abdominal pain - she has not lost weight. She is having only a BM a week or less. I have recommended she seek GI evaluation and have given her 3 names to choose from.  Leonia Reeves.D.

## 2018-10-16 ENCOUNTER — Other Ambulatory Visit: Payer: Self-pay

## 2018-10-16 DIAGNOSIS — N760 Acute vaginitis: Secondary | ICD-10-CM

## 2018-10-16 DIAGNOSIS — B9689 Other specified bacterial agents as the cause of diseases classified elsewhere: Secondary | ICD-10-CM

## 2018-10-16 NOTE — Addendum Note (Signed)
Addended by: Nadya Hopwood H on: 10/16/2018 07:36 AM   Modules accepted: Orders  

## 2018-10-17 ENCOUNTER — Telehealth: Payer: Self-pay | Admitting: *Deleted

## 2018-10-17 NOTE — Telephone Encounter (Signed)
Patient called stating her prescription was denied, patient states this is for bacteria infection, patient was scheduled for 3/25 for an annual with Dr Clearance Coots patient canceled due to work and rescheduled for 11/01/2018 at 3:00 with Dr Clearance Coots. Please call patient regarding prescription.

## 2018-10-18 ENCOUNTER — Other Ambulatory Visit: Payer: Self-pay

## 2018-10-18 DIAGNOSIS — N760 Acute vaginitis: Secondary | ICD-10-CM

## 2018-10-18 DIAGNOSIS — B9689 Other specified bacterial agents as the cause of diseases classified elsewhere: Secondary | ICD-10-CM

## 2018-10-19 ENCOUNTER — Telehealth: Payer: Self-pay

## 2018-10-19 DIAGNOSIS — N760 Acute vaginitis: Secondary | ICD-10-CM

## 2018-10-19 DIAGNOSIS — B9689 Other specified bacterial agents as the cause of diseases classified elsewhere: Secondary | ICD-10-CM

## 2018-10-19 MED ORDER — METRONIDAZOLE 500 MG PO TABS: 500.0000 mg | ORAL_TABLET | Freq: Two times a day (BID) | ORAL | 0 refills | Status: DC

## 2018-10-19 NOTE — Telephone Encounter (Signed)
Returned call, advised that provider approved refill for flagyl.

## 2018-10-25 ENCOUNTER — Encounter: Payer: Self-pay | Admitting: Internal Medicine

## 2018-10-25 ENCOUNTER — Ambulatory Visit: Payer: Self-pay | Admitting: Obstetrics

## 2018-11-01 ENCOUNTER — Ambulatory Visit: Payer: Self-pay | Admitting: Obstetrics

## 2019-01-09 ENCOUNTER — Other Ambulatory Visit: Payer: Self-pay

## 2019-01-09 DIAGNOSIS — N760 Acute vaginitis: Secondary | ICD-10-CM

## 2019-01-09 DIAGNOSIS — B9689 Other specified bacterial agents as the cause of diseases classified elsewhere: Secondary | ICD-10-CM

## 2019-01-11 ENCOUNTER — Other Ambulatory Visit: Payer: Self-pay

## 2019-01-11 DIAGNOSIS — B9689 Other specified bacterial agents as the cause of diseases classified elsewhere: Secondary | ICD-10-CM

## 2019-01-11 DIAGNOSIS — N760 Acute vaginitis: Secondary | ICD-10-CM

## 2019-01-12 ENCOUNTER — Telehealth: Payer: Self-pay

## 2019-01-12 MED ORDER — METRONIDAZOLE 500 MG PO TABS: 500.0000 mg | ORAL_TABLET | Freq: Two times a day (BID) | ORAL | 0 refills | Status: DC

## 2019-01-12 NOTE — Telephone Encounter (Signed)
Returned call, pt reports vaginal discharge and odor, requests rx refill for bv. Sent with provider approval

## 2019-02-08 ENCOUNTER — Other Ambulatory Visit: Payer: Self-pay

## 2019-02-08 ENCOUNTER — Encounter: Payer: Self-pay | Admitting: Obstetrics

## 2019-02-08 ENCOUNTER — Other Ambulatory Visit (HOSPITAL_COMMUNITY)
Admission: RE | Admit: 2019-02-08 | Discharge: 2019-02-08 | Disposition: A | Discharge status: Home / Self Care | Payer: Managed Care, Other (non HMO) | Source: Ambulatory Visit | Attending: Obstetrics | Admitting: Obstetrics

## 2019-02-08 ENCOUNTER — Ambulatory Visit (INDEPENDENT_AMBULATORY_CARE_PROVIDER_SITE_OTHER): Payer: Managed Care, Other (non HMO) | Admitting: Obstetrics

## 2019-02-08 VITALS — BP 118/72 | HR 79 | Ht 64.0 in | Wt 125.0 lb

## 2019-02-08 DIAGNOSIS — Z01419 Encounter for gynecological examination (general) (routine) without abnormal findings: Secondary | ICD-10-CM | POA: Diagnosis present

## 2019-02-08 DIAGNOSIS — Z30011 Encounter for initial prescription of contraceptive pills: Secondary | ICD-10-CM

## 2019-02-08 DIAGNOSIS — R87613 High grade squamous intraepithelial lesion on cytologic smear of cervix (HGSIL): Secondary | ICD-10-CM

## 2019-02-08 DIAGNOSIS — N898 Other specified noninflammatory disorders of vagina: Secondary | ICD-10-CM

## 2019-02-08 DIAGNOSIS — Z3009 Encounter for other general counseling and advice on contraception: Secondary | ICD-10-CM

## 2019-02-08 MED ORDER — NORETHIN-ETH ESTRAD-FE BIPHAS 1 MG-10 MCG / 10 MCG PO TABS: 1.0000 | ORAL_TABLET | Freq: Every day | ORAL | 11 refills | Status: DC

## 2019-02-08 NOTE — Progress Notes (Signed)
Subjective:        Courtney Rice is a 28 y.o. female here for a routine exam.  Current complaints: None.  Patient indicated that she had colposcopy done last year but there was no follow up.  Personal health questionnaire:  Is patient Ashkenazi Jewish, have a family history of breast and/or ovarian cancer: no Is there a family history of uterine cancer diagnosed at age < 70, gastrointestinal cancer, urinary tract cancer, family member who is a Field seismologist syndrome-associated carrier: no Is the patient overweight and hypertensive, family history of diabetes, personal history of gestational diabetes, preeclampsia or PCOS: yes Is patient over 65, have PCOS,  family history of premature CHD under age 23, diabetes, smoke, have hypertension or peripheral artery disease:  no At any time, has a partner hit, kicked or otherwise hurt or frightened you?: no Over the past 2 weeks, have you felt down, depressed or hopeless?: no Over the past 2 weeks, have you felt little interest or pleasure in doing things?:no   Gynecologic History Patient's last menstrual period was 01/20/2019. Contraception: condoms Last Pap: 09-20-2017. Results were: LGSIL.                 Colposcopy: HGSIL- No Follow Up Last mammogram: n/a. Results were: n/a  Obstetric History OB History  Gravida Para Term Preterm AB Living  1 1 1     1   SAB TAB Ectopic Multiple Live Births          1    # Outcome Date GA Lbr Len/2nd Weight Sex Delivery Anes PTL Lv  1 Term 10/25/13 [redacted]w[redacted]d 06:09 / 00:21 6 lb 9.4 oz (2.988 kg) M Vag-Spont EPI, Local  LIV    Past Medical History:  Diagnosis Date  . AICD (automatic cardioverter/defibrillator) present   . Long Q-T syndrome Type 2    a. post partum syncope and VT PM;  b. 11/2013 s/p BSX SubQ ICD placement.  . Migraine   . Pre-syncope admitted 08/01/2015   a. felt to be 2/2 orthostatic hypotension - propranolol decreased.     Past Surgical History:  Procedure Laterality Date  .  IMPLANTABLE CARDIOVERTER DEFIBRILLATOR IMPLANT  12-20-2013   BSX SubQ ICD implanted by Dr Caryl Comes  . IMPLANTABLE CARDIOVERTER DEFIBRILLATOR IMPLANT N/A 12/20/2013   Procedure: SUB Q ICD;  Surgeon: Deboraha Sprang, MD;  Location: [facility] CATH LAB;  Service: Cardiovascular;  Laterality: N/A;  . TONSILLECTOMY AND ADENOIDECTOMY  2011     Current Outpatient Medications:  .  acetaminophen (TYLENOL) 325 MG tablet, Take 650 mg by mouth every 6 (six) hours as needed for mild pain, moderate pain, fever or headache., Disp: , Rfl:  .  drospirenone-ethinyl estradiol (YAZ) 3-0.02 MG tablet, Take 1 tablet by mouth daily. (Patient not taking: Reported on 10/12/2018), Disp: 1 Package, Rfl: 11 .  Norethindrone-Ethinyl Estradiol-Fe Biphas (LO LOESTRIN FE) 1 MG-10 MCG / 10 MCG tablet, Take 1 tablet by mouth daily., Disp: 1 Package, Rfl: 11 .  propranolol (INDERAL) 20 MG tablet, Take 1 tablet (20 mg total) by mouth 2 (two) times daily. (Patient not taking: Reported on 10/12/2018), Disp: 60 tablet, Rfl: 8 No Known Allergies  Social History   Tobacco Use  . Smoking status: Never Smoker  . Smokeless tobacco: Never Used  Substance Use Topics  . Alcohol use: No    Family History  Problem Relation Age of Onset  . Heart disease Mother   . Diabetes Mother   . Asthma Mother   .  COPD Mother   . Lupus Mother   . Stroke Mother   . Hypertension Mother   . COPD Maternal Grandmother   . Heart disease Maternal Grandmother   . Breast cancer Maternal Aunt   . Hearing loss Neg Hx   . Heart attack Neg Hx       Review of Systems  Constitutional: negative for fatigue and weight loss Respiratory: negative for cough and wheezing Cardiovascular: negative for chest pain, fatigue and palpitations Gastrointestinal: negative for abdominal pain and change in bowel habits Musculoskeletal:negative for myalgias Neurological: negative for gait problems and tremors Behavioral/Psych: negative for abusive relationship,  depression Endocrine: negative for temperature intolerance    Genitourinary:negative for abnormal menstrual periods, genital lesions, hot flashes, sexual problems and vaginal discharge Integument/breast: negative for breast lump, breast tenderness, nipple discharge and skin lesion(s)    Objective:       BP 118/72   Pulse 79   Ht $Remove [id number] 4"  BMI 21.46 kg/m  General:   alert  Skin:   no rash or abnormalities  Lungs:   clear to auscultation bilaterally  Heart:   regular rate and rhythm, S1, S2 normal, no murmur, click, rub or gallop  Breasts:   normal without suspicious masses, skin or nipple changes or axillary nodes  Abdomen:  normal findings: no organomegaly, soft, non-tender and no hernia  Pelvis:  External genitalia: normal general appearance Urinary system: urethral meatus normal and bladder without fullness, nontender Vaginal: normal without tenderness, induration or masses Cervix: normal appearance Adnexa: normal bimanual exam Uterus: anteverted and non-tender, normal size   Lab Review Urine pregnancy test Labs reviewed yes Radiologic studies reviewed no  50% of 25 min visit spent on counseling and coordination of care.   Assessment and Plan:     1. Encounter for routine gynecological examination with Papanicolaou smear of cervix Rx - Cytology - PAP( [place])  2. Vaginal discharge Rx: - Cervicovaginal ancillary only( [place])  3. Encounter for other general counseling or advice on contraception - wants OCP's  4. Encounter for initial prescription of contraceptive pills Rx: - Norethindrone-Ethinyl Estradiol-Fe Biphas (LO LOESTRIN FE) 1 MG-10 MCG / 10 MCG tablet; Take 1 tablet by mouth daily.  Dispense: 1 Package; Refill: 11  5. High grade squamous intraepithelial cervical dysplasia - scheduled for LEEP Consult   Plan:    Education reviewed: calcium supplements, depression evaluation, low fat, low  cholesterol diet, safe sex/STD prevention, self breast exams and weight bearing exercise. Contraception: OCP (estrogen/progesterone). Follow up in: 4 weeks.   Meds ordered this encounter  Medications  . Norethindrone-Ethinyl Estradiol-Fe Biphas (LO LOESTRIN FE) 1 MG-10 MCG / 10 MCG tablet    Sig: Take 1 tablet by mouth daily.    Dispense:  1 Package    Refill:  11   No orders of the defined types were placed in this encounter.  Brock BadHARLES A. Agapita Savarino MD 02-08-2019

## 2019-02-08 NOTE — Patient Instructions (Signed)
Cervical Dysplasia  Cervical dysplasia is a condition in which a woman's cervix cells have abnormal changes. The cervix is the opening of the uterus (womb). It is located between the vagina and the uterus. Cervical dysplasia may be an early sign of cervical cancer. If left untreated, this condition may become more severe and may progress to cervical cancer. Early detection, treatment, and follow-up care are very important. What are the causes? Cervical dysplasia can be caused by a human papillomavirus (HPV) infection. HPV is the most common sexually transmitted infection (STI). HPV is spread from person to person through sexual contact. This includes oral, vaginal, or anal sex. What increases the risk? The following factors may make you more likely to develop this condition:  Having had a sexually transmitted infection (STI), such as herpes, chlamydia or HPV.  Becoming sexually active before age 18.  Having had more than one sexual partner.  Having a sexual partner who has multiple sexual partners.  Not using protection, such as a condom, during sex, especially with new sexual partners.  Having a history of cancer of the vagina or vulva.  Having a weakened body defense (immune) system.  Being the daughter of a woman who took diethylstilbestrol (DES) during pregnancy.  Having a family history of cervical cancer.  Smoking.  Using oral contraceptives, also called birth control pills.  Having had three or more full-term pregnancies. What are the signs or symptoms? There are usually no symptoms of this condition. If you do have symptoms, they may include:  Abnormal vaginal discharge.  Bleeding between periods or after sex.  Bleeding during menopause.  Pain during sex (dyspareunia). How is this diagnosed? A test called a Pap test may be done. During this test, cells are taken from the cervix and then examined under a microscope. A test in which tissue is removed from the cervix  (biopsy) may also be done if the Pap test is abnormal or if the cervix looks abnormal. How is this treated? Treatment varies based on the severity of the condition. Treatment may include:  Cryotherapy. During cryotherapy, the abnormal cells are frozen with a steel-tip instrument.  Loop electrosurgical excision procedure (LEEP). This procedure removes abnormal tissue from the cervix.  Surgery to remove abnormal tissue. This is usually done in more advanced cases. Surgical options include: ? A cone biopsy. This is a procedure in which the cervical canal and a portion of the center of the cervix are removed. ? Hysterectomy. This is a surgery in which the uterus and cervix are removed. Follow these instructions at home:  Take over-the-counter and prescription medicines only as told by your health care provider.  Do not use tampons, have sex, or douche until your health care provider says it is okay.  Keep follow-up visits as told by your health care provider. This is important. Women who have been treated for cervical dysplasia should have regular pelvic exams and Pap tests as told by their health care provider. How is this prevented?  Practice safe sex to help prevent sexually transmitted infections (STI) that may cause this condition.  Have regular Pap tests. Talk with your health care provider about how often you need these tests. Pap tests will help identify cell changes that can lead to cancer. Contact a health care provider if:  You develop genital warts. Get help right away if:  You have a fever.  You have abnormal vaginal discharge.  Your menstrual period is heavier than normal.  You develop bright red bleeding.   This may include blood clots.  You have pain or cramps that get worse, and medicine does not help to relieve your pain.  You feel light-headed and you are unusually weak.  You have fainting spells.  You have pain in the abdomen. Summary  Cervical dysplasia is  a condition in which a woman's cervix cells have abnormal changes.  If left untreated, this condition may become more severe and may progress to cervical cancer.  Early detection, treatment, and follow-up care are very important in managing this condition.  Have regular pelvic exams and Pap tests. Talk with your health care provider about how often you need these tests. Pap tests will help identify cell changes that can lead to cancer. This information is not intended to replace advice given to you by your health care provider. Make sure you discuss any questions you have with your health care provider. Document Released: 07/19/2005 Document Revised: 05/10/2018 Document Reviewed: 07/22/2016 Elsevier Patient Education  2020 [place] Electrosurgical Excision Procedure Loop electrosurgical excision procedure (LEEP) is the cutting and removal (excision) of tissue from the cervix. The cervix is the bottom part of the uterus that opens into the vagina. The tissue that is removed from the cervix is examined to see if there are precancerous cells or cancer cells present. LEEP may be done when:  You have abnormal bleeding from your cervix.  You have an abnormal Pap test result.  Your health care provider finds an abnormality on your cervix during a pelvic exam. LEEP typically only takes a few minutes and is often done in the health care provider's office. The procedure is safe for women who are trying to get pregnant. However, the procedure is usually not done during a menstrual period or during pregnancy. Tell a health care provider about:  Any allergies you have.  All medicines you are taking, including vitamins, herbs, eye drops, creams, and over-the-counter medicines.  Any blood disorders you have.  Any medical conditions you have, including current or past vaginal infections such as herpes or sexually-transmitted infections (STIs).  Whether you are pregnant or may be pregnant.   Whether or not you are having vaginal bleeding on the day of the procedure. What are the risks? Generally, this is a safe procedure. However, problems may occur, including:  Infection.  Bleeding.  Allergic reactions to medicines.  Changes or scarring in the cervix.  Increased risk of early (preterm) labor in future pregnancies. What happens before the procedure?  Ask your health care provider about: ? Changing or stopping your regular medicines. This is especially important if you are taking diabetes medicines or blood thinners. ? Taking medicines such as aspirin and ibuprofen. These medicines can thin your blood. Do not take these medicines unless your health care provider tells you to take them. ? Taking over-the-counter medicines, vitamins, herbs, and supplements.  Your health care provider may recommend that you take pain medicine before the procedure.  Ask your health care provider if you should plan to have someone take you home after the procedure. What happens during the procedure?   An instrument called a speculum will be placed in your vagina. This will allow your health care provider to see your cervix.  You will be given a medicine to numb the area (local anesthetic). The medicine will be injected into your cervix and the surrounding area.  A solution will be applied to your cervix. This solution will help the health care provider find the abnormal cells that  need to be removed.  A thin wire loop will be passed through your vagina. The wire will be used to burn (cauterize) the cervical tissue with an electrical current.  You may feel faint during the procedure. Tell your health care provider right away if you feel this way.  The abnormal cervical tissue will be removed.  Any open blood vessels will be cauterized to prevent bleeding.  A paste may be applied to the cauterized area of your cervix to help prevent bleeding.  The sample of cervical tissue will be  examined under a microscope. The procedure may vary among health care providers and hospitals. What can I expect after the procedure? After the procedure, it is common to have:  Mild abdominal cramps that are similar to menstrual cramps. These may last for up to 1 week.  A small amount of pink-tinged or bloody vaginal discharge, including light to moderate bleeding, for 1-2 weeks.  A dark-colored discharge coming from your vagina. This is from the paste that was used on the cervix to prevent bleeding. It is up to you to get the results of your procedure. Ask your health care provider, or the department that is doing the procedure, when your results will be ready. Follow these instructions at home:  Take over-the-counter and prescription medicines only as told by your health care provider.  Return to your normal activities as told by your health care provider. Ask your health care provider what activities are safe for you.  Do not put anything in your vagina for 2 weeks after the procedure or until your health care provider says that it is okay. This includes tampons, creams, and douches.  Do not have sex until your health care provider approves.  Keep all follow-up visits as told by your health care provider. This is important. Contact a health care provider if you:  Have a fever or chills.  Feel unusually weak.  Have vaginal bleeding that is heavier or longer than a normal menstrual cycle. A sign of this can be soaking a pad with blood or bleeding with clots.  Develop a bad smelling vaginal discharge.  Have severe abdominal pain or cramping. Summary  Loop electrosurgical excision procedure (LEEP) is the removal of tissue from the cervix. The removed tissue will be checked for precancerous cells or cancer cells.  LEEP typically only takes a few minutes and is often done in the health care provider's office.  Do not put anything in your vagina for 2 weeks after the procedure or  until your health care provider says that it is okay. This includes tampons, creams, and douches.  Keep all follow-up visits as told by your health care provider. Ask your health care provider, or the department that is doing the procedure, when your results will be ready. This information is not intended to replace advice given to you by your health care provider. Make sure you discuss any questions you have with your health care provider. Document Released: 10/09/2002 Document Revised: 08/11/2018 Document Reviewed: 08/11/2018 Elsevier Patient Education  2020 [employer]  Oral Contraception Information Oral contraceptive pills (OCPs) are medicines taken to prevent pregnancy. OCPs are taken by mouth, and they work by:  Preventing the ovaries from releasing eggs.  Thickening mucus in the lower part of the uterus (cervix), which prevents sperm from entering the uterus.  Thinning the lining of the uterus (endometrium), which prevents a fertilized egg from attaching to the endometrium. OCPs are highly effective when taken exactly as  prescribed. However, OCPs do not prevent STIs (sexually transmitted infections). Safe sex practices, such as using condoms while on an OCP, can help prevent STIs. Before starting OCPs Before you start taking OCPs, you may have a physical exam, blood test, and Pap test. However, you are not required to have a pelvic exam in order to be prescribed OCPs. Your health care provider will make sure you are a good candidate for oral contraception. OCPs are not a good option for certain women, including women who smoke and are older than 35 years, and women with a medical history of high blood pressure, deep vein thrombosis, pulmonary embolism, stroke, cardiovascular disease, or peripheral vascular disease. Discuss with your health care provider the possible side effects of the OCP you may be prescribed. When you start an OCP, be aware that it can take 2-3 months for your body to  adjust to changes in hormone levels. Follow instructions from your health care provider about how to start taking your first cycle of OCPs. Depending on when you start the pill, you may need to use a backup form of birth control, such as condoms, during the first week. Make sure you know what steps to take if you ever forget to take the pill. Types of oral contraception  The most common types of birth control pills contain the hormones estrogen and progestin (synthetic progesterone) or progestin only. The combination pill This type of pill contains estrogen and progestin hormones. Combination pills often come in packs of 21, 28, or 91 pills. For each pack, the last 7 pills may not contain hormones, which means you may stop taking the pills for 7 days. Menstrual bleeding occurs during the week that you do not take the pills or that you take the pills with no hormones in them. The minipill This type of pill contains the progestin hormone only. It comes in packs of 28 pills. All 28 pills contain the hormone. You take the pill every day. It is very important to take the pill at the same time each day. Advantages of oral contraceptive pills  Provides reliable and continuous contraception if taken as instructed.  May treat or decrease symptoms of: ? Menstrual period cramps. ? Irregular menstrual cycle or bleeding. ? Heavy menstrual flow. ? Abnormal uterine bleeding. ? Acne, depending on the type of pill. ? Polycystic ovarian syndrome. ? Endometriosis. ? Iron deficiency anemia. ? Premenstrual symptoms, including premenstrual dysphoric disorder.  May reduce the risk of endometrial and ovarian cancer.  Can be used as emergency contraception.  Prevents mislocated (ectopic) pregnancies and infections of the fallopian tubes. Things that can make oral contraceptive pills less effective OCPs can be less effective if:  You forget to take the pill at the same time every day. This is especially  important when taking the minipill.  You have a stomach or intestinal disease that reduces your body's ability to absorb the pill.  You take OCPs with other medicines that make OCPs less effective, such as antibiotics, certain HIV medicines, and some seizure medicines.  You take expired OCPs.  You forget to restart the pill on day 7, if using the packs of 21 pills. Risks associated with oral contraceptive pills Oral contraceptive pills can sometimes cause side effects, such as:  Headache.  Depression.  Trouble sleeping.  Nausea and vomiting.  Breast tenderness.  Irregular bleeding or spotting during the first several months.  Bloating or fluid retention.  Increase in blood pressure. Combination pills are also associated with  a small increase in the risk of:  Blood clots.  Heart attack.  Stroke. Summary  Oral contraceptive pills are medicines taken by mouth to prevent pregnancy. They are highly effective when taken exactly as prescribed.  The most common types of birth control pills contain the hormones estrogen and progestin (synthetic progesterone) or progestin only.  Before you start taking the pill, you may have a physical exam, blood test, and Pap test. Your health care provider will make sure you are a good candidate for oral contraception.  The combination pill may come in a 21-day pack, a 28-day pack, or a 91-day pack. The minipill contains the progesterone hormone only and comes in packs of 28 pills.  Oral contraceptive pills can sometimes cause side effects, such as headache, nausea, breast tenderness, or irregular bleeding. This information is not intended to replace advice given to you by your health care provider. Make sure you discuss any questions you have with your health care provider. Document Released: 10/09/2002 Document Revised: 07/01/2017 Document Reviewed: 10/12/2016 Elsevier Patient Education  2020 [employer]  Oral Contraception Use Oral  contraceptive pills (OCPs) are medicines that you take to prevent pregnancy. OCPs work by:  Preventing the ovaries from releasing eggs.  Thickening mucus in the lower part of the uterus (cervix), which prevents sperm from entering the uterus.  Thinning the lining of the uterus (endometrium), which prevents a fertilized egg from attaching to the endometrium. OCPs are highly effective when taken exactly as prescribed. However, OCPs do not prevent sexually transmitted infections (STIs). Safe sex practices, such as using condoms while on an OCP, can help prevent STIs. Before taking OCPs, you may have a physical exam, blood test, and Pap test. A Pap test involves taking a sample of cells from your cervix to check for cancer. Discuss with your health care provider the possible side effects of the OCP you may be prescribed. When you start an OCP, be aware that it can take 2-3 months for your body to adjust to changes in hormone levels. How to take oral contraceptive pills Follow instructions from your health care provider about how to start taking your first cycle of OCPs. Your health care provider may recommend that you:  Start the pill on day 1 of your menstrual period. If you start at this time, you will not need any backup form of birth control (contraception), such as condoms.  Start the pill on the first Sunday after your menstrual period or on the day you get your prescription. In these cases, you will need to use backup contraception for the first week.  Start the pill at any time of your cycle. ? If you take the pill within 5 days of the start of your period, you will not need a backup form of contraception. ? If you start at any other time of your menstrual cycle, you will need to use another form of contraception for 7 days. If your OCP is the type called a minipill, it will protect you from pregnancy after taking it for 2 days (48 hours), and you can stop using backup contraception after that  time. After you have started taking OCPs:  If you forget to take 1 pill, take it as soon as you remember. Take the next pill at the regular time.  If you miss 2 or more pills, call your health care provider. Different pills have different instructions for missed doses. Use backup birth control until your next menstrual period starts.  If  you use a 28-day pack that contains inactive pills and you miss 1 of the last 7 pills (pills with no hormones), throw away the rest of the non-hormone pills and start a new pill pack. No matter which day you start the OCP, you will always start a new pack on that same day of the week. Have an extra pack of OCPs and a backup contraceptive method available in case you miss some pills or lose your OCP pack. Follow these instructions at home:  Do not use any products that contain nicotine or tobacco, such as cigarettes and e-cigarettes. If you need help quitting, ask your health care provider.  Always use a condom to protect against STIs. OCPs do not protect against STIs.  Use a calendar to mark the days of your menstrual period.  Read the information and directions that came with your OCP. Talk to your health care provider if you have questions. Contact a health care provider if:  You develop nausea and vomiting.  You have abnormal vaginal discharge or bleeding.  You develop a rash.  You miss your menstrual period. Depending on the type of OCP you are taking, this may be a sign of pregnancy. Ask your health care provider for more information.  You are losing your hair.  You need treatment for mood swings or depression.  You get dizzy when taking the OCP.  You develop acne after taking the OCP.  You become pregnant or think you may be pregnant.  You have diarrhea, constipation, and abdominal pain or cramps.  You miss 2 or more pills. Get help right away if:  You develop chest pain.  You develop shortness of breath.  You have an uncontrolled  or severe headache.  You develop numbness or slurred speech.  You develop visual or speech problems.  You develop pain, redness, and swelling in your legs.  You develop weakness or numbness in your arms or legs. Summary  Oral contraceptive pills (OCPs) are medicines that you take to prevent pregnancy.  OCPs do not prevent sexually transmitted infections (STIs). Always use a condom to protect against STIs.  When you start an OCP, be aware that it can take 2-3 months for your body to adjust to changes in hormone levels.  Read all the information and directions that come with your OCP. This information is not intended to replace advice given to you by your health care provider. Make sure you discuss any questions you have with your health care provider. Document Released: 07/08/2011 Document Revised: 11/10/2018 Document Reviewed: 08/30/2016 Elsevier Patient Education  2020 [employer].

## 2019-02-09 LAB — CERVICOVAGINAL ANCILLARY ONLY
Bacterial vaginitis: NEGATIVE
Candida vaginitis: POSITIVE — AB
Chlamydia: NEGATIVE
Neisseria Gonorrhea: NEGATIVE
Trichomonas: NEGATIVE

## 2019-02-12 ENCOUNTER — Other Ambulatory Visit: Payer: Self-pay | Admitting: Obstetrics

## 2019-02-12 DIAGNOSIS — B373 Candidiasis of vulva and vagina: Secondary | ICD-10-CM

## 2019-02-12 DIAGNOSIS — B3731 Acute candidiasis of vulva and vagina: Secondary | ICD-10-CM

## 2019-02-12 MED ORDER — FLUCONAZOLE 150 MG PO TABS: 150.0000 mg | ORAL_TABLET | Freq: Once | ORAL | 0 refills | Status: AC

## 2019-02-13 LAB — CYTOLOGY - PAP

## 2019-03-07 ENCOUNTER — Ambulatory Visit: Payer: Managed Care, Other (non HMO) | Admitting: Obstetrics & Gynecology

## 2019-03-15 ENCOUNTER — Telehealth: Payer: Self-pay | Admitting: Obstetrics & Gynecology

## 2019-03-15 ENCOUNTER — Emergency Department (HOSPITAL_BASED_OUTPATIENT_CLINIC_OR_DEPARTMENT_OTHER): Payer: Managed Care, Other (non HMO)

## 2019-03-15 ENCOUNTER — Other Ambulatory Visit: Payer: Self-pay

## 2019-03-15 ENCOUNTER — Encounter (HOSPITAL_BASED_OUTPATIENT_CLINIC_OR_DEPARTMENT_OTHER): Payer: Self-pay

## 2019-03-15 ENCOUNTER — Emergency Department (HOSPITAL_BASED_OUTPATIENT_CLINIC_OR_DEPARTMENT_OTHER)
Admission: EM | Admit: 2019-03-15 | Discharge: 2019-03-15 | Disposition: A | Discharge status: Home / Self Care | Payer: Managed Care, Other (non HMO) | Attending: Emergency Medicine | Admitting: Emergency Medicine

## 2019-03-15 DIAGNOSIS — Y9389 Activity, other specified: Secondary | ICD-10-CM | POA: Diagnosis not present

## 2019-03-15 DIAGNOSIS — Z79899 Other long term (current) drug therapy: Secondary | ICD-10-CM | POA: Insufficient documentation

## 2019-03-15 DIAGNOSIS — Y999 Unspecified external cause status: Secondary | ICD-10-CM | POA: Insufficient documentation

## 2019-03-15 DIAGNOSIS — M791 Myalgia, unspecified site: Secondary | ICD-10-CM

## 2019-03-15 DIAGNOSIS — Z95810 Presence of automatic (implantable) cardiac defibrillator: Secondary | ICD-10-CM | POA: Insufficient documentation

## 2019-03-15 DIAGNOSIS — M542 Cervicalgia: Secondary | ICD-10-CM | POA: Insufficient documentation

## 2019-03-15 DIAGNOSIS — Y92410 Unspecified street and highway as the place of occurrence of the external cause: Secondary | ICD-10-CM | POA: Insufficient documentation

## 2019-03-15 DIAGNOSIS — S39012A Strain of muscle, fascia and tendon of lower back, initial encounter: Secondary | ICD-10-CM | POA: Diagnosis not present

## 2019-03-15 DIAGNOSIS — S3992XA Unspecified injury of lower back, initial encounter: Secondary | ICD-10-CM | POA: Diagnosis present

## 2019-03-15 MED ORDER — METHOCARBAMOL 500 MG PO TABS: 500.0000 mg | ORAL_TABLET | Freq: Two times a day (BID) | ORAL | 0 refills | Status: DC | PRN

## 2019-03-15 MED ORDER — NAPROXEN 250 MG PO TABS
500.0000 mg | ORAL_TABLET | Freq: Once | ORAL | Status: AC
  Administered 2019-03-15: 500 mg via ORAL
  Filled 2019-03-15: qty 2

## 2019-03-15 MED ORDER — NAPROXEN 500 MG PO TABS: 500.0000 mg | ORAL_TABLET | Freq: Two times a day (BID) | ORAL | 0 refills | Status: DC | PRN
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
## 2019-03-15 NOTE — Discharge Instructions (Signed)

## 2019-03-19 ENCOUNTER — Telehealth: Payer: Self-pay | Admitting: Internal Medicine

## 2019-03-19 NOTE — Telephone Encounter (Signed)
New Message   Patient states that she was supposed to have paperwork filled out and faxed to the [facility] (medical release form for Defibrillator). Patient states that it was supposed to be completed after her office visit in March of 2020, but she has now received a letter in the mail confirming that no paperwork has been received. Please call patient back to assist.

## 2019-03-19 NOTE — Telephone Encounter (Signed)
Call returned to Pt.  Pt will have DMV resubmit paperwork.  Fax # given.

## 2019-03-20 ENCOUNTER — Other Ambulatory Visit: Payer: Self-pay

## 2019-03-20 ENCOUNTER — Ambulatory Visit (INDEPENDENT_AMBULATORY_CARE_PROVIDER_SITE_OTHER): Payer: Managed Care, Other (non HMO) | Admitting: Student

## 2019-03-20 DIAGNOSIS — I4581 Long QT syndrome: Secondary | ICD-10-CM

## 2019-03-20 LAB — CUP PACEART INCLINIC DEVICE CHECK
Date Time Interrogation Session: 20200818120419
Implantable Lead Implant Date: 20150521
Implantable Lead Location: 753862
Implantable Lead Model: 3400
Implantable Pulse Generator Implant Date: 20150521
Pulse Gen Model: 1010
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
## 2019-03-20 NOTE — Patient Instructions (Signed)
Medication Instructions:  Your physician recommends that you continue on your current medications as directed. Please refer to the Current Medication list given to you today.  If you need a refill on your cardiac medications before your next appointment, please call your pharmacy.   Lab work: NONE ORDERED  TODAY   If you have labs (blood work) drawn today and your tests are completely normal, you will receive your results only by: Marland Kitchen MyChart Message (if you have MyChart) OR . A paper copy in the mail If you have any lab test that is abnormal or we need to change your treatment, we will call you to review the results.  Testing/Procedures: NONE ORDERED  TODAY   Follow-Up: AS SCHEDULED IN MARCH 2021 WITH TAYLOR   Any Other Special Instructions Will Be Listed Below (If Applicable).

## 2019-04-02 ENCOUNTER — Encounter

## 2019-04-04 ENCOUNTER — Other Ambulatory Visit: Payer: Self-pay

## 2019-04-04 MED ORDER — METRONIDAZOLE 500 MG PO TABS: 500.0000 mg | ORAL_TABLET | Freq: Two times a day (BID) | ORAL | 0 refills | Status: DC

## 2019-04-04 NOTE — Telephone Encounter (Signed)
Patient thinks she has BV and would like to have flagyl called into her pharmacy. She reports having a fishy odor and discharge. Flagyl has been sent to [facility]. Patient advised to follow up if symptoms do not improved.

## 2019-04-25 ENCOUNTER — Ambulatory Visit: Payer: Managed Care, Other (non HMO) | Admitting: Obstetrics and Gynecology

## 2019-04-26 ENCOUNTER — Other Ambulatory Visit: Payer: Self-pay

## 2019-04-26 DIAGNOSIS — Z20822 Contact with and (suspected) exposure to covid-19: Secondary | ICD-10-CM

## 2019-04-28 LAB — NOVEL CORONAVIRUS, NAA: SARS-CoV-2, NAA: NOT DETECTED

## 2019-05-09 NOTE — Telephone Encounter (Signed)
DMV paperwork filled out.  Attempted to fax to [facility] with busy response.  Paperwork given to MR for follow up.

## 2019-05-14 ENCOUNTER — Other Ambulatory Visit: Payer: Self-pay

## 2019-05-14 ENCOUNTER — Other Ambulatory Visit (HOSPITAL_COMMUNITY)
Admission: RE | Admit: 2019-05-14 | Discharge: 2019-05-14 | Disposition: A | Discharge status: Home / Self Care | Payer: Managed Care, Other (non HMO) | Source: Ambulatory Visit | Attending: Obstetrics and Gynecology | Admitting: Obstetrics and Gynecology

## 2019-05-14 ENCOUNTER — Ambulatory Visit (INDEPENDENT_AMBULATORY_CARE_PROVIDER_SITE_OTHER): Payer: Managed Care, Other (non HMO)

## 2019-05-14 DIAGNOSIS — N898 Other specified noninflammatory disorders of vagina: Secondary | ICD-10-CM

## 2019-05-14 NOTE — Progress Notes (Signed)
Pt presents for self swab. Pt complains of having vaginal discharge with a little odor and vaginal itching/irritation. Pt states that sx started about a week ago. She request to have STD screening completed.

## 2019-05-14 NOTE — Progress Notes (Signed)
Patient seen and assessed by nursing staff during this encounter. I have reviewed the chart and agree with the documentation and plan.  Mora Bellman, MD 05/14/2019 4:40 PM

## 2019-05-17 ENCOUNTER — Telehealth: Payer: Self-pay

## 2019-05-17 LAB — CERVICOVAGINAL ANCILLARY ONLY
Bacterial Vaginitis (gardnerella): POSITIVE — AB
Candida Glabrata: NEGATIVE
Candida Vaginitis: POSITIVE — AB
Chlamydia: NEGATIVE
Comment: NEGATIVE
Comment: NEGATIVE
Comment: NEGATIVE
Comment: NEGATIVE
Comment: NEGATIVE
Comment: NORMAL
Neisseria Gonorrhea: NEGATIVE
Trichomonas: NEGATIVE

## 2019-05-17 MED ORDER — METRONIDAZOLE 500 MG PO TABS: 500.0000 mg | ORAL_TABLET | Freq: Two times a day (BID) | ORAL | 0 refills | Status: DC

## 2019-05-17 MED ORDER — FLUCONAZOLE 150 MG PO TABS: 150.0000 mg | ORAL_TABLET | Freq: Once | ORAL | 0 refills | Status: AC

## 2019-05-17 NOTE — Telephone Encounter (Signed)
Patient called for culture results.  Made patient aware that she does have yeast and BV.  Patient made aware we will send in two prescriptions for her  Flagyl for the BV and a diflucan for the yeast. Patient states understanding. Kathrene Alu RN

## 2019-05-18 MED ORDER — FLUCONAZOLE 150 MG PO TABS: 150.0000 mg | ORAL_TABLET | Freq: Once | ORAL | 0 refills | Status: AC

## 2019-05-18 NOTE — Addendum Note (Signed)
Addended by: Mora Bellman on: 05/18/2019 07:57 AM   Modules accepted: Orders

## 2019-05-28 ENCOUNTER — Telehealth: Payer: Self-pay

## 2019-05-28 NOTE — Telephone Encounter (Signed)
Confirmed with Deak[place] the [employer] that the patient's device is not MRI compatible. Patient made aware and questions answered.

## 2019-05-28 NOTE — Telephone Encounter (Signed)
Pt wanted to know if she can have a MRI. I asked the device nurse Jenny Reichmann, Rn and she states the pt can not have a MRI.

## 2019-05-31 ENCOUNTER — Encounter: Payer: Self-pay | Admitting: Obstetrics and Gynecology

## 2019-05-31 ENCOUNTER — Ambulatory Visit: Payer: Managed Care, Other (non HMO) | Admitting: Obstetrics and Gynecology

## 2019-05-31 DIAGNOSIS — Z09 Encounter for follow-up examination after completed treatment for conditions other than malignant neoplasm: Secondary | ICD-10-CM

## 2019-08-01 ENCOUNTER — Other Ambulatory Visit: Payer: Self-pay | Admitting: Obstetrics

## 2019-08-09 ENCOUNTER — Encounter: Payer: Managed Care, Other (non HMO) | Admitting: Obstetrics & Gynecology

## 2019-08-23 ENCOUNTER — Encounter: Payer: Managed Care, Other (non HMO) | Admitting: Obstetrics

## 2019-09-06 ENCOUNTER — Ambulatory Visit (INDEPENDENT_AMBULATORY_CARE_PROVIDER_SITE_OTHER): Payer: Managed Care, Other (non HMO) | Admitting: Obstetrics & Gynecology

## 2019-09-06 ENCOUNTER — Other Ambulatory Visit (HOSPITAL_COMMUNITY)
Admission: RE | Admit: 2019-09-06 | Discharge: 2019-09-06 | Disposition: A | Discharge status: Home / Self Care | Payer: Managed Care, Other (non HMO) | Source: Ambulatory Visit | Attending: Obstetrics & Gynecology | Admitting: Obstetrics & Gynecology

## 2019-09-06 ENCOUNTER — Other Ambulatory Visit: Payer: Self-pay

## 2019-09-06 VITALS — BP 141/89 | HR 101 | Wt 130.0 lb

## 2019-09-06 DIAGNOSIS — R87613 High grade squamous intraepithelial lesion on cytologic smear of cervix (HGSIL): Secondary | ICD-10-CM | POA: Insufficient documentation

## 2019-09-06 DIAGNOSIS — B9689 Other specified bacterial agents as the cause of diseases classified elsewhere: Secondary | ICD-10-CM | POA: Diagnosis not present

## 2019-09-06 DIAGNOSIS — A749 Chlamydial infection, unspecified: Secondary | ICD-10-CM | POA: Insufficient documentation

## 2019-09-06 DIAGNOSIS — N871 Moderate cervical dysplasia: Secondary | ICD-10-CM

## 2019-09-06 DIAGNOSIS — Z3202 Encounter for pregnancy test, result negative: Secondary | ICD-10-CM

## 2019-09-06 DIAGNOSIS — N760 Acute vaginitis: Secondary | ICD-10-CM | POA: Insufficient documentation

## 2019-09-06 LAB — POCT URINE PREGNANCY: Preg Test, Ur: NEGATIVE

## 2019-09-06 NOTE — Progress Notes (Signed)
Patient identified, informed consent obtained, signed copy in chart, time out performed.  Pap smear and colposcopy reviewed.   Pap HGSIL Colpo Biopsy CIN 2-3 ECC negative Teflon coated speculum with smoke evacuator placed.  Cervix visualized. Paracervical block placed.  Medium size Fischer loop used to remove cone of cervix using blend of cut and cautery 50/50 W on LEEP machine.  Edges/Base cauterized with Ball.  Monsel's solution used for hemostasis.  Patient tolerated procedure well.  Patient given post procedure instructions.  Follow up in 6-12 months for repeat pap or as needed.Patient ID: Courtney Rice, female   DOB: Oct 07, 1990, 29 y.o.   MRN: [id number]

## 2019-09-06 NOTE — Progress Notes (Signed)
Pt request std testing today.

## 2019-09-06 NOTE — Patient Instructions (Signed)
Loop Electrosurgical Excision Procedure Loop electrosurgical excision procedure (LEEP) is the cutting and removal (excision) of tissue from the cervix. The cervix is the bottom part of the uterus that opens into the vagina. The tissue that is removed from the cervix is examined to see if there are precancerous cells or cancer cells present. LEEP may be done when:  You have abnormal bleeding from your cervix.  You have an abnormal Pap test result.  Your health care provider finds an abnormality on your cervix during a pelvic exam. LEEP typically only takes a few minutes and is often done in the health care provider's office. The procedure is safe for women who are trying to get pregnant. However, the procedure is usually not done during a menstrual period or during pregnancy. Tell a health care provider about:  Any allergies you have.  All medicines you are taking, including vitamins, herbs, eye drops, creams, and over-the-counter medicines.  Any blood disorders you have.  Any medical conditions you have, including current or past vaginal infections such as herpes or sexually-transmitted infections (STIs).  Whether you are pregnant or may be pregnant.  Whether or not you are having vaginal bleeding on the day of the procedure. What are the risks? Generally, this is a safe procedure. However, problems may occur, including:  Infection.  Bleeding.  Allergic reactions to medicines.  Changes or scarring in the cervix.  Increased risk of early (preterm) labor in future pregnancies. What happens before the procedure?  Ask your health care provider about: ? Changing or stopping your regular medicines. This is especially important if you are taking diabetes medicines or blood thinners. ? Taking medicines such as aspirin and ibuprofen. These medicines can thin your blood. Do not take these medicines unless your health care provider tells you to take them. ? Taking over-the-counter  medicines, vitamins, herbs, and supplements.  Your health care provider may recommend that you take pain medicine before the procedure.  Ask your health care provider if you should plan to have someone take you home after the procedure. What happens during the procedure?   An instrument called a speculum will be placed in your vagina. This will allow your health care provider to see your cervix.  You will be given a medicine to numb the area (local anesthetic). The medicine will be injected into your cervix and the surrounding area.  A solution will be applied to your cervix. This solution will help the health care provider find the abnormal cells that need to be removed.  A thin wire loop will be passed through your vagina. The wire will be used to burn (cauterize) the cervical tissue with an electrical current.  You may feel faint during the procedure. Tell your health care provider right away if you feel this way.  The abnormal cervical tissue will be removed.  Any open blood vessels will be cauterized to prevent bleeding.  A paste may be applied to the cauterized area of your cervix to help prevent bleeding.  The sample of cervical tissue will be examined under a microscope. The procedure may vary among health care providers and hospitals. What can I expect after the procedure? After the procedure, it is common to have:  Mild abdominal cramps that are similar to menstrual cramps. These may last for up to 1 week.  A small amount of pink-tinged or bloody vaginal discharge, including light to moderate bleeding, for 1-2 weeks.  A dark-colored discharge coming from your vagina. This is from   the paste that was used on the cervix to prevent bleeding. It is up to you to get the results of your procedure. Ask your health care provider, or the department that is doing the procedure, when your results will be ready. Follow these instructions at home:  Take over-the-counter and  prescription medicines only as told by your health care provider.  Return to your normal activities as told by your health care provider. Ask your health care provider what activities are safe for you.  Do not put anything in your vagina for 2 weeks after the procedure or until your health care provider says that it is okay. This includes tampons, creams, and douches.  Do not have sex until your health care provider approves.  Keep all follow-up visits as told by your health care provider. This is important. Contact a health care provider if you:  Have a fever or chills.  Feel unusually weak.  Have vaginal bleeding that is heavier or longer than a normal menstrual cycle. A sign of this can be soaking a pad with blood or bleeding with clots.  Develop a bad smelling vaginal discharge.  Have severe abdominal pain or cramping. Summary  Loop electrosurgical excision procedure (LEEP) is the removal of tissue from the cervix. The removed tissue will be checked for precancerous cells or cancer cells.  LEEP typically only takes a few minutes and is often done in the health care provider's office.  Do not put anything in your vagina for 2 weeks after the procedure or until your health care provider says that it is okay. This includes tampons, creams, and douches.  Keep all follow-up visits as told by your health care provider. Ask your health care provider, or the department that is doing the procedure, when your results will be ready. This information is not intended to replace advice given to you by your health care provider. Make sure you discuss any questions you have with your health care provider. Document Revised: 08/11/2018 Document Reviewed: 08/11/2018 Elsevier Patient Education  2020 Elsevier Inc.  

## 2019-09-07 ENCOUNTER — Other Ambulatory Visit: Payer: Self-pay | Admitting: Certified Nurse Midwife

## 2019-09-07 DIAGNOSIS — B9689 Other specified bacterial agents as the cause of diseases classified elsewhere: Secondary | ICD-10-CM

## 2019-09-07 DIAGNOSIS — N760 Acute vaginitis: Secondary | ICD-10-CM

## 2019-09-07 DIAGNOSIS — A749 Chlamydial infection, unspecified: Secondary | ICD-10-CM

## 2019-09-07 LAB — CERVICOVAGINAL ANCILLARY ONLY
Bacterial Vaginitis (gardnerella): POSITIVE — AB
Candida Glabrata: NEGATIVE
Candida Vaginitis: NEGATIVE
Chlamydia: POSITIVE — AB
Comment: NEGATIVE
Comment: NEGATIVE
Comment: NEGATIVE
Comment: NEGATIVE
Comment: NEGATIVE
Comment: NORMAL
Neisseria Gonorrhea: NEGATIVE
Trichomonas: NEGATIVE

## 2019-09-07 MED ORDER — AZITHROMYCIN 500 MG PO TABS: 1000.0000 mg | ORAL_TABLET | Freq: Once | ORAL | 0 refills | Status: AC

## 2019-09-07 MED ORDER — METRONIDAZOLE 500 MG PO TABS: 500.0000 mg | ORAL_TABLET | Freq: Two times a day (BID) | ORAL | 0 refills | Status: DC

## 2019-09-07 NOTE — Progress Notes (Addendum)
Flagyl and Azithromycin sent at request of Dr. Marice Potter for +results.

## 2019-09-10 ENCOUNTER — Other Ambulatory Visit: Payer: Self-pay | Admitting: Obstetrics & Gynecology

## 2019-09-10 DIAGNOSIS — R87612 Low grade squamous intraepithelial lesion on cytologic smear of cervix (LGSIL): Secondary | ICD-10-CM

## 2019-09-10 LAB — SURGICAL PATHOLOGY

## 2019-09-10 MED ORDER — METRONIDAZOLE 500 MG PO TABS: 500.0000 mg | ORAL_TABLET | Freq: Two times a day (BID) | ORAL | 0 refills | Status: AC

## 2019-09-10 MED ORDER — AZITHROMYCIN 500 MG PO TABS: 1000.0000 mg | ORAL_TABLET | Freq: Once | ORAL | 0 refills | Status: AC

## 2019-09-10 NOTE — Progress Notes (Signed)
Rx for chlamydia and BV
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
## 2019-09-19 ENCOUNTER — Ambulatory Visit (INDEPENDENT_AMBULATORY_CARE_PROVIDER_SITE_OTHER): Payer: Managed Care, Other (non HMO) | Admitting: Student

## 2019-09-19 ENCOUNTER — Encounter: Payer: Self-pay | Admitting: Student

## 2019-09-19 ENCOUNTER — Other Ambulatory Visit: Payer: Self-pay

## 2019-09-19 VITALS — BP 128/80 | HR 83 | Ht 64.0 in | Wt 128.0 lb

## 2019-09-19 DIAGNOSIS — I472 Ventricular tachycardia: Secondary | ICD-10-CM | POA: Diagnosis not present

## 2019-09-19 DIAGNOSIS — I4581 Long QT syndrome: Secondary | ICD-10-CM | POA: Diagnosis not present

## 2019-09-19 DIAGNOSIS — I4729 Other ventricular tachycardia: Secondary | ICD-10-CM

## 2019-09-19 LAB — CUP PACEART INCLINIC DEVICE CHECK
Date Time Interrogation Session: 20210217093658
Implantable Lead Implant Date: 20150521
Implantable Lead Location: 753862
Implantable Lead Model: 3400
Implantable Pulse Generator Implant Date: 20150521
Pulse Gen Model: 1010

## 2019-09-19 NOTE — Patient Instructions (Signed)
Medication Instructions:  none *If you need a refill on your cardiac medications before your next appointment, please call your pharmacy*  Lab Work: none If you have labs (blood work) drawn today and your tests are completely normal, you will receive your results only by: Marland Kitchen MyChart Message (if you have MyChart) OR . A paper copy in the mail If you have any lab test that is abnormal or we need to change your treatment, we will call you to review the results.  Testing/Procedures: none  Follow-Up: At [facility], you and your health needs are our priority.  As part of our continuing mission to provide you with exceptional heart care, we have created designated Provider Care Teams.  These Care Teams include your primary Cardiologist (physician) and Advanced Practice Providers (APPs -  Physician Assistants and Nurse Practitioners) who all work together to provide you with the care you need, when you need it.  Your next appointment:   3 months  The format for your next appointment:   In person  Provider:   Otilio Saber, PA  Other Instructions

## 2019-12-21 ENCOUNTER — Other Ambulatory Visit: Payer: Self-pay | Admitting: Cardiology

## 2020-02-15 ENCOUNTER — Telehealth: Payer: Self-pay | Admitting: Licensed Clinical Social Worker

## 2020-02-15 NOTE — Telephone Encounter (Signed)
Pt returned CSW call and was able to speak regarding potential financial assistance options.  Though pt is listed as having insurance in the chart she reports that she recently switched jobs and is no longer insured.  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  CSW first discussed ACA insurance- pt is agreeable to looking into and CSW sent link to look at pricing options.  CSW then discussed [employer] program which helps with cone specific bills.  Based off of what pt told CSW about her income and resources would like qualify for some sort of coverage through this program- appears to be below 300% below poverty line so should get 75% coverage for cone bills as long as she is not over asset requirements.  Emailed pt copy of the application.  CSW also sent pt copy of [employer] which covers community MD visits with very limited copay.  She would be able to get a specialty provider referral through her assigned PCP with this program.  Pt does report she has a 37 year old child who is on Medicaid but she has looked into getting Children and Family Medicaid for herself in the past and was denied.  Has a friend who works at [employer] who she has spoken to recently and she still appears not to qualify for this program.  CSW will plan to check in with pt next week to check in regarding her progress.  Burna Sis, LCSW Clinical Social Worker [facility] Desk#: [phone] Cell#: [phone]

## 2020-02-15 NOTE — Telephone Encounter (Signed)
CSW informed that pt is not willing to schedule a f/up appt with [facility] due to copay cost concerns- clinic wondering if there are any assistance options for pt.  CSW attempted to call pt to discuss- unable to reach- left VM requesting return call  Burna Sis, LCSW Clinical Social Worker [facility] Desk#: [phone] Cell#: [phone]

## 2020-02-21 ENCOUNTER — Telehealth (HOSPITAL_COMMUNITY): Payer: Self-pay | Admitting: Licensed Clinical Social Worker

## 2020-02-21 NOTE — Telephone Encounter (Signed)
CSW attempted to call pt to follow up regarding insurance and see if pt had any further questions or needs at this time- unable to reach- left VM requesting she call back if she had any needs  Burna Sis, LCSW Clinical Social Worker [facility] Desk#: [phone] Cell#: [phone]

## 2020-04-21 ENCOUNTER — Telehealth: Payer: Self-pay

## 2020-04-21 MED ORDER — METRONIDAZOLE 500 MG PO TABS: 500.0000 mg | ORAL_TABLET | Freq: Two times a day (BID) | ORAL | 0 refills | Status: DC

## 2020-04-21 NOTE — Telephone Encounter (Signed)
Patient is requesting medication for BV. She reports having an odor and discharge for last couple and is requesting flagyl be called into her pharmacy. Flagyl called into to [facility] on [address]. Advised patient if symptoms do not get better she will need to follow up with our office for a self swab before any more refills will be sent out. Patient verbalizes understanding.

## 2020-04-30 ENCOUNTER — Encounter: Payer: Self-pay | Admitting: Student

## 2020-04-30 ENCOUNTER — Other Ambulatory Visit: Payer: Self-pay

## 2020-04-30 ENCOUNTER — Ambulatory Visit (INDEPENDENT_AMBULATORY_CARE_PROVIDER_SITE_OTHER): Payer: Self-pay | Admitting: Student

## 2020-04-30 VITALS — BP 118/68 | HR 73 | Ht 64.0 in | Wt 124.0 lb

## 2020-04-30 DIAGNOSIS — I4581 Long QT syndrome: Secondary | ICD-10-CM

## 2020-04-30 DIAGNOSIS — I4729 Other ventricular tachycardia: Secondary | ICD-10-CM

## 2020-04-30 DIAGNOSIS — I472 Ventricular tachycardia: Secondary | ICD-10-CM

## 2020-04-30 LAB — CUP PACEART INCLINIC DEVICE CHECK
Date Time Interrogation Session: 20210929112357
Implantable Lead Implant Date: 20150521
Implantable Lead Location: 753862
Implantable Lead Model: 3400
Implantable Pulse Generator Implant Date: 20150521
Pulse Gen Model: 1010

## 2020-04-30 NOTE — Patient Instructions (Signed)
Medication Instructions:  *If you need a refill on your cardiac medications before your next appointment, please call your pharmacy*  Follow-Up: At [facility], you and your health needs are our priority.  As part of our continuing mission to provide you with exceptional heart care, we have created designated Provider Care Teams.  These Care Teams include your primary Cardiologist (physician) and Advanced Practice Providers (APPs -  Physician Assistants and Nurse Practitioners) who all work together to provide you with the care you need, when you need it.  We recommend signing up for the patient portal called "MyChart".  Sign up information is provided on this After Visit Summary.  MyChart is used to connect with patients for Virtual Visits (Telemedicine).  Patients are able to view lab/test results, encounter notes, upcoming appointments, etc.  Non-urgent messages can be sent to your provider as well.   To learn more about what you can do with MyChart, go to [email or web address].    Your next appointment:   Your physician recommends that you schedule a follow-up appointment in: 5 MONTHS with Dr. Ladona Ridgel to discuss having your battery for your ICD changed  The format for your next appointment:   In Person with Lewayne Bunting, MD

## 2020-04-30 NOTE — Progress Notes (Signed)
Electrophysiology Office Note Date: 04/30/2020  ID:  Courtney Rice, DOB 1991/07/31, MRN [id number]  PCP: Patient, No Pcp Per Primary Cardiologist: No primary care provider on file. Electrophysiologist: Lewayne Bunting, MD   CC: Routine ICD follow-up  Courtney Rice is a 29 y.o. female seen today for Lewayne Bunting, MD for routine electrophysiology followup.  Since last being seen in our clinic the patient reports doing well overall. She reports her USOH.  she denies chest pain, palpitations, dyspnea, PND, orthopnea, nausea, vomiting, dizziness, syncope, edema, weight gain, or early satiety. She has not had ICD shocks.   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 AICD (automatic cardioverter/defibrillator) present   . Long Q-T syndrome Type 2    a. post partum syncope and VT PM;  b. 11/2013 s/p BSX SubQ ICD placement.  . Migraine   . Pre-syncope admitted 08/01/2015   a. felt to be 2/2 orthostatic hypotension - propranolol decreased.    Past Surgical History:  Procedure Laterality Date  . IMPLANTABLE CARDIOVERTER DEFIBRILLATOR IMPLANT  12-20-2013   BSX SubQ ICD implanted by Dr Graciela Husbands  . IMPLANTABLE CARDIOVERTER DEFIBRILLATOR IMPLANT N/A 12/20/2013   Procedure: SUB Q ICD;  Surgeon: Duke Salvia, MD;  Location: [facility] CATH LAB;  Service: Cardiovascular;  Laterality: N/A;  . TONSILLECTOMY AND ADENOIDECTOMY  2011    Current Outpatient Medications  Medication Sig Dispense Refill  . metroNIDAZOLE (FLAGYL) 500 MG tablet Take 1 tablet (500 mg total) by mouth 2 (two) times daily. 14 tablet 0  . propranolol (INDERAL) 20 MG tablet Take 1 tablet (20 mg total) by mouth 2 (two) times daily. 60 tablet 8   No current facility-administered medications for this visit.    Allergies:   Patient has no  known allergies.   Social History: Social History   Socioeconomic History  . Marital status: Single    Spouse name: Not on file  . Number of children: Not on file  . Years of education: Not on file  . Highest education level: Not on file  Occupational History  . Not on file  Tobacco Use  . Smoking status: Never Smoker  . Smokeless tobacco: Never Used  Vaping Use  . Vaping Use: Never used  Substance and Sexual Activity  . Alcohol use: No  . Drug use: No  . Sexual activity: Yes    Partners: Male    Birth control/protection: Condom  Other Topics Concern  . Not on file  Social History Narrative  . Not on file   Social Determinants of Health   Financial Resource Strain:   . Difficulty of Paying Living Expenses: Not on file  Food Insecurity:   . Worried About Programme researcher, broadcasting/film/video in the Last Year: Not on file  . Ran Out of Food in the Last Year: Not on file  Transportation Needs:   . Lack of Transportation (Medical): Not on file  . Lack of Transportation (Non-Medical): Not on file  Physical Activity:   . Days of Exercise per Week: Not on file  . Minutes of Exercise per Session: Not on file  Stress:   . Feeling of Stress : Not on file  Social Connections:   . Frequency of Communication with Friends and Family: Not on file  . Frequency of Social Gatherings  with Friends and Family: Not on file  . Attends Religious Services: Not on file  . Active Member of Clubs or Organizations: Not on file  . Attends Banker Meetings: Not on file  . Marital Status: Not on file  Intimate Partner Violence:   . Fear of Current or Ex-Partner: Not on file  . Emotionally Abused: Not on file  . Physically Abused: Not on file  . Sexually Abused: Not on file    Family History: Family History  Problem Relation Age of Onset  . Heart disease Mother   . Diabetes Mother   . Asthma Mother   . COPD Mother   . Lupus Mother   . Stroke Mother   . Hypertension Mother   . COPD  Maternal Grandmother   . Heart disease Maternal Grandmother   . Breast cancer Maternal Aunt   . Hearing loss Neg Hx   . Heart attack Neg Hx     Review of Systems: All other systems reviewed and are otherwise negative except as noted above.   Physical Exam: There were no vitals filed for this visit.   GEN- The patient is well appearing, alert and oriented x 3 today.   HEENT: normocephalic, atraumatic; sclera clear, conjunctiva pink; hearing intact; oropharynx clear; neck supple, no JVP Lymph- no cervical lymphadenopathy Lungs- Clear to ausculation bilaterally, normal work of breathing.  No wheezes, rales, rhonchi Heart- Regular rate and rhythm, no murmurs, rubs or gallops, PMI not laterally displaced GI- soft, non-tender, non-distended, bowel sounds present, no hepatosplenomegaly Extremities- no clubbing or cyanosis. No edema; DP/PT/radial pulses 2+ bilaterally MS- no significant deformity or atrophy Skin- warm and dry, no rash or lesion; ICD pocket well healed Psych- euthymic mood, full affect Neuro- strength and sensation are intact  ICD interrogation- reviewed in detail today,  See PACEART report  EKG:  EKG is not ordered today.  Recent Labs: No results found for requested labs within last 8760 hours.   Wt Readings from Last 3 Encounters:  09/19/19 128 lb (58.1 kg)  09/06/19 130 lb (59 kg)  03/20/19 130 lb (59 kg)     Other studies Reviewed: Additional studies/ records that were reviewed today include: Previous EP office notes   Assessment and Plan:  1.  VT with syncope/prolonged QT syndrome s/p Architect  euvolemic today Stable on an appropriate medical regimen Normal ICD function See Pace Art report No changes today Avoid QT prolonging agents  Estimated 6-9 months until ERI per discussion with [employer] based on progression of %. RTC 5 months with Dr. Ladona Ridgel to discuss gen change. Battery alarm demonstrated for patient who knows to alert [place]  immediately if occurs sooner than her follow up. (Per [facility] may have as few as 21 days prior to EOS after ERI with her model of device)   Current medicines are reviewed at length with the patient today.   The patient does not have concerns regarding her medicines.  The following changes were made today:  none  Labs/ tests ordered today include:  No orders of the defined types were placed in this encounter.  Disposition:   Follow up with Dr. Ladona Ridgel  5 months to discuss gen change  Signed, Luane School  04/30/2020 8:20 AM  [facility] [address] [place] [address] [phone] (office) [phone] (fax)

## 2020-09-16 ENCOUNTER — Telehealth: Payer: Self-pay | Admitting: Internal Medicine

## 2020-09-16 NOTE — Telephone Encounter (Signed)
Patient states she is thinking about not having surgery to replace the defibrillator battery. Please advise.

## 2020-09-17 NOTE — Telephone Encounter (Signed)
See mychart message.  Advised Pt needed to keep upcoming appt with Dr. Ladona Ridgel to discuss not changing ICD battery risks/benefits.

## 2020-09-18 ENCOUNTER — Telehealth: Payer: Self-pay

## 2020-09-18 MED ORDER — FLUCONAZOLE 150 MG PO TABS: 150.0000 mg | ORAL_TABLET | Freq: Once | ORAL | 0 refills | Status: AC

## 2020-09-18 NOTE — Telephone Encounter (Signed)
Returned call, pt stated that she is having white discharge.  Pt recently treated with antibiotics for BV, advised would send rx for yeast with provider approval. Advised pt to call if symptoms do not resolve, pt agreed.

## 2020-09-19 NOTE — Telephone Encounter (Signed)
Dr. Ladona Ridgel notified that Pt is canceling upcoming appointment because she does not want to change the battery in her ICD.  See mychart messages.

## 2020-09-29 ENCOUNTER — Ambulatory Visit: Payer: Medicaid Other | Admitting: Internal Medicine

## 2020-09-30 ENCOUNTER — Ambulatory Visit: Payer: Self-pay | Admitting: Emergency Medicine

## 2020-09-30 ENCOUNTER — Other Ambulatory Visit: Payer: Self-pay

## 2020-09-30 DIAGNOSIS — I4581 Long QT syndrome: Secondary | ICD-10-CM

## 2020-09-30 LAB — CUP PACEART INCLINIC DEVICE CHECK
Date Time Interrogation Session: 20220301133318
Implantable Lead Implant Date: 20150521
Implantable Lead Location: 753862
Implantable Lead Model: 3400
Implantable Pulse Generator Implant Date: 20150521
Pulse Gen Model: 1010

## 2020-09-30 NOTE — Progress Notes (Signed)
Subcutaneous ICD check in clinic. 0 untreated episodes; 0 treated episodes; 0 shocks delivered. Electrode impedance status okay. No programming changes. Remaining longevity @ ERI. Scheduled for discussion with Dr Ladona Ridgel to discuss replacement. Alarm for ERI disabled. See scanned in report.

## 2020-09-30 NOTE — Patient Instructions (Addendum)
You will receive a call from the scheduler to set up an appointment to discuss changing the battery in your defibrillator.

## 2020-10-02 ENCOUNTER — Encounter: Payer: Self-pay | Admitting: Internal Medicine

## 2020-10-02 ENCOUNTER — Ambulatory Visit (INDEPENDENT_AMBULATORY_CARE_PROVIDER_SITE_OTHER): Payer: Self-pay | Admitting: Internal Medicine

## 2020-10-02 ENCOUNTER — Other Ambulatory Visit: Payer: Self-pay | Admitting: Internal Medicine

## 2020-10-02 ENCOUNTER — Encounter: Payer: Medicaid Other | Admitting: Internal Medicine

## 2020-10-02 ENCOUNTER — Other Ambulatory Visit: Payer: Self-pay

## 2020-10-02 VITALS — BP 112/80 | HR 81 | Ht 64.0 in | Wt 121.4 lb

## 2020-10-02 DIAGNOSIS — I4729 Other ventricular tachycardia: Secondary | ICD-10-CM

## 2020-10-02 DIAGNOSIS — I4581 Long QT syndrome: Secondary | ICD-10-CM

## 2020-10-02 DIAGNOSIS — I472 Ventricular tachycardia: Secondary | ICD-10-CM

## 2020-10-02 NOTE — Patient Instructions (Addendum)
Medication Instructions:  Your physician recommends that you continue on your current medications as directed. Please refer to the Current Medication list given to you today.  Labwork: You will get lab work today:  BMP and CBC  Testing/Procedures: None ordered.  Follow-Up:  You will get a phone call from Mindi Junker to confirm date/time arrival and all instructions   Any Other Special Instructions Will Be Listed Below (If Applicable).  If you need a refill on your cardiac medications before your next appointment, please call your pharmacy.   [facility] - Preparing For Surgery  Before surgery, you can play an important role. Because skin is not sterile, your skin needs to be as free of germs as possible. You can reduce the number of germs on your skin by washing with CHG (chlorahexidine gluconate) Soap before surgery.  CHG is an antiseptic cleaner which kills germs and bonds with the skin to continue killing germs even after washing.   Please do not use if you have an allergy to CHG or antibacterial soaps.  If your skin becomes reddened/irritated stop using the CHG.   Do not shave (including legs and underarms) for at least 48 hours prior to first CHG shower.  It is OK to shave your face.  Please follow these instructions carefully:  1.  Shower the night before surgery and the morning of surgery with CHG.  2.  If you choose to wash your hair, wash your hair first as usual with your normal shampoo.  3.  After you shampoo, rinse your hair and body thoroughly to remove the shampoo.  4.  Use CHG as you would any other liquid soap.  You can apply CHG directly to the skin and wash gently with a clean washcloth. 5.  Apply the CHG Soap to your body ONLY FROM THE NECK DOWN.  Do not use on open wounds or open sores.  Avoid contact with your eyes, ears, mouth and genitals (private parts).  Wash genitals (private parts) with your normal soap.  6.  Wash thoroughly, paying special attention to the area  where your surgery will be performed.  7.  Thoroughly rinse your body with warm water from the neck down.   8.  DO NOT shower/wash with your normal soap after using and rinsing off the CHG soap.  9.  Pat yourself dry with a clean towel.           10.  Wear clean pajamas.           11.  Place clean sheets on your bed the night of your first shower and do not sleep with pets.  Day of Surgery: Do not apply any deodorants/lotions.  Please wear clean clothes to the hospital/surgery center.

## 2020-10-02 NOTE — H&P (View-Only) (Signed)
HPI Ms. Cerrone returns today for followup. She is a pleasant 30 yo woman with congenital long QT who had recurrent syncope and PMVT/TDP during pregnancy and underwent S-ICD insertion. She has been maintained on a beta blocker and has had no ICD therapies. She has reached ERI. She has not had syncope. Her mother and grandmother are patients of mine, both with ICD's and episodes of VF with successful ICD therapies. No Known Allergies   Current Outpatient Medications  Medication Sig Dispense Refill  . propranolol (INDERAL) 20 MG tablet Take 20 mg by mouth as needed.     No current facility-administered medications for this visit.     Past Medical History:  Diagnosis Date  . AICD (automatic cardioverter/defibrillator) present   . Long Q-T syndrome Type 2    a. post partum syncope and VT PM;  b. 11/2013 s/p BSX SubQ ICD placement.  . Migraine   . Pre-syncope admitted 08/01/2015   a. felt to be 2/2 orthostatic hypotension - propranolol decreased.     ROS:   All systems reviewed and negative except as noted in the HPI.   Past Surgical History:  Procedure Laterality Date  . IMPLANTABLE CARDIOVERTER DEFIBRILLATOR IMPLANT  12-20-2013   BSX SubQ ICD implanted by Dr Graciela Husbands  . IMPLANTABLE CARDIOVERTER DEFIBRILLATOR IMPLANT N/A 12/20/2013   Procedure: SUB Q ICD;  Surgeon: Duke Salvia, MD;  Location: [facility] CATH LAB;  Service: Cardiovascular;  Laterality: N/A;  . TONSILLECTOMY AND ADENOIDECTOMY  2011     Family History  Problem Relation Age of Onset  . Heart disease Mother   . Diabetes Mother   . Asthma Mother   . COPD Mother   . Lupus Mother   . Stroke Mother   . Hypertension Mother   . COPD Maternal Grandmother   . Heart disease Maternal Grandmother   . Breast cancer Maternal Aunt   . Hearing loss Neg Hx   . Heart attack Neg Hx      Social History   Socioeconomic History  . Marital status: Single    Spouse name: Not on file  . Number of children: Not on file  .  Years of education: Not on file  . Highest education level: Not on file  Occupational History  . Not on file  Tobacco Use  . Smoking status: Never Smoker  . Smokeless tobacco: Never Used  Vaping Use  . Vaping Use: Never used  Substance and Sexual Activity  . Alcohol use: No  . Drug use: No  . Sexual activity: Yes    Partners: Male    Birth control/protection: Condom  Other Topics Concern  . Not on file  Social History Narrative  . Not on file   Social Determinants of Health   Financial Resource Strain: Not on file  Food Insecurity: Not on file  Transportation Needs: Not on file  Physical Activity: Not on file  Stress: Not on file  Social Connections: Not on file  Intimate Partner Violence: Not on file     BP 112/80   Pulse 81   Ht 5\' 4"  (1.626 m)   Wt 121 lb 6.4 oz (55.1 kg)   SpO2 99%   BMI 20.84 kg/m   Physical Exam:  Well appearing young woman, NAD HEENT: Unremarkable Neck:  No JVD, no thyromegally Lymphatics:  No adenopathy Back:  No CVA tenderness Lungs:  Clear with no wheezes HEART:  Regular rate rhythm, no murmurs, no rubs, no clicks Abd:  soft, positive bowel sounds, no organomegally, no rebound, no guarding Ext:  2 plus pulses, no edema, no cyanosis, no clubbing Skin:  No rashes no nodules Neuro:  CN II through XII intact, motor grossly intact  EKG - NSR with QTC of 460  DEVICE  Normal device function.  See PaceArt for details. ERI  Assess/Plan: 1. Congenital long QT - she has not had any additional arrhythmias. I encouraged her to undergone ICD gen change out. She will remain on a beta blocker. 2. ICD - she has reached ERI on her S-ICD. She will undergo ICD gen change out in coming weeks with Dr. SK who placed her device initially.   Elley Harp,MD 

## 2020-10-02 NOTE — Progress Notes (Signed)
HPI Courtney Rice returns today for followup. She is a pleasant 30 yo woman with congenital long QT who had recurrent syncope and PMVT/TDP during pregnancy and underwent S-ICD insertion. She has been maintained on a beta blocker and has had no ICD therapies. She has reached ERI. She has not had syncope. Her mother and grandmother are patients of mine, both with ICD's and episodes of VF with successful ICD therapies. No Known Allergies   Current Outpatient Medications  Medication Sig Dispense Refill  . propranolol (INDERAL) 20 MG tablet Take 20 mg by mouth as needed.     No current facility-administered medications for this visit.     Past Medical History:  Diagnosis Date  . AICD (automatic cardioverter/defibrillator) present   . Long Q-T syndrome Type 2    a. post partum syncope and VT PM;  b. 11/2013 s/p BSX SubQ ICD placement.  . Migraine   . Pre-syncope admitted 08/01/2015   a. felt to be 2/2 orthostatic hypotension - propranolol decreased.     ROS:   All systems reviewed and negative except as noted in the HPI.   Past Surgical History:  Procedure Laterality Date  . IMPLANTABLE CARDIOVERTER DEFIBRILLATOR IMPLANT  12-20-2013   BSX SubQ ICD implanted by Dr Graciela Husbands  . IMPLANTABLE CARDIOVERTER DEFIBRILLATOR IMPLANT N/A 12/20/2013   Procedure: SUB Q ICD;  Surgeon: Duke Salvia, MD;  Location: [facility] CATH LAB;  Service: Cardiovascular;  Laterality: N/A;  . TONSILLECTOMY AND ADENOIDECTOMY  2011     Family History  Problem Relation Age of Onset  . Heart disease Mother   . Diabetes Mother   . Asthma Mother   . COPD Mother   . Lupus Mother   . Stroke Mother   . Hypertension Mother   . COPD Maternal Grandmother   . Heart disease Maternal Grandmother   . Breast cancer Maternal Aunt   . Hearing loss Neg Hx   . Heart attack Neg Hx      Social History   Socioeconomic History  . Marital status: Single    Spouse name: Not on file  . Number of children: Not on file  .  Years of education: Not on file  . Highest education level: Not on file  Occupational History  . Not on file  Tobacco Use  . Smoking status: Never Smoker  . Smokeless tobacco: Never Used  Vaping Use  . Vaping Use: Never used  Substance and Sexual Activity  . Alcohol use: No  . Drug use: No  . Sexual activity: Yes    Partners: Male    Birth control/protection: Condom  Other Topics Concern  . Not on file  Social History Narrative  . Not on file   Social Determinants of Health   Financial Resource Strain: Not on file  Food Insecurity: Not on file  Transportation Needs: Not on file  Physical Activity: Not on file  Stress: Not on file  Social Connections: Not on file  Intimate Partner Violence: Not on file     BP 112/80   Pulse 81   Ht 5\' 4"  (1.626 m)   Wt 121 lb 6.4 oz (55.1 kg)   SpO2 99%   BMI 20.84 kg/m   Physical Exam:  Well appearing young woman, NAD HEENT: Unremarkable Neck:  No JVD, no thyromegally Lymphatics:  No adenopathy Back:  No CVA tenderness Lungs:  Clear with no wheezes HEART:  Regular rate rhythm, no murmurs, no rubs, no clicks Abd:  soft, positive bowel sounds, no organomegally, no rebound, no guarding Ext:  2 plus pulses, no edema, no cyanosis, no clubbing Skin:  No rashes no nodules Neuro:  CN II through XII intact, motor grossly intact  EKG - NSR with QTC of 460  DEVICE  Normal device function.  See PaceArt for details. ERI  Assess/Plan: 1. Congenital long QT - she has not had any additional arrhythmias. I encouraged her to undergone ICD gen change out. She will remain on a beta blocker. 2. ICD - she has reached ERI on her S-ICD. She will undergo ICD gen change out in coming weeks with Dr. Crissie Sickles who placed her device initially.   Courtney Gowda Sher Shampine,MD

## 2020-10-02 NOTE — Telephone Encounter (Signed)
Patient calling to schedule surgery to replace the defibrillator battery.

## 2020-10-03 LAB — CBC WITH DIFFERENTIAL/PLATELET
Basophils Absolute: 0.1 10*3/uL (ref 0.0–0.2)
Basos: 1 %
EOS (ABSOLUTE): 0.1 10*3/uL (ref 0.0–0.4)
Eos: 1 %
Hematocrit: 40.6 % (ref 34.0–46.6)
Hemoglobin: 14.2 g/dL (ref 11.1–15.9)
Immature Grans (Abs): 0 10*3/uL (ref 0.0–0.1)
Immature Granulocytes: 0 %
Lymphocytes Absolute: 2.6 10*3/uL (ref 0.7–3.1)
Lymphs: 56 %
MCH: 31.6 pg (ref 26.6–33.0)
MCHC: 35 g/dL (ref 31.5–35.7)
MCV: 90 fL (ref 79–97)
Monocytes Absolute: 0.5 10*3/uL (ref 0.1–0.9)
Monocytes: 11 %
Neutrophils Absolute: 1.5 10*3/uL (ref 1.4–7.0)
Neutrophils: 31 %
Platelets: 228 10*3/uL (ref 150–450)
RBC: 4.49 x10E6/uL (ref 3.77–5.28)
RDW: 12.1 % (ref 11.7–15.4)
WBC: 4.7 10*3/uL (ref 3.4–10.8)

## 2020-10-03 LAB — BASIC METABOLIC PANEL
BUN/Creatinine Ratio: 9 (ref 9–23)
BUN: 7 mg/dL (ref 6–20)
CO2: 22 mmol/L (ref 20–29)
Calcium: 9.7 mg/dL (ref 8.7–10.2)
Chloride: 104 mmol/L (ref 96–106)
Creatinine, Ser: 0.76 mg/dL (ref 0.57–1.00)
Glucose: 85 mg/dL (ref 65–99)
Potassium: 4.6 mmol/L (ref 3.5–5.2)
Sodium: 141 mmol/L (ref 134–144)
eGFR: 109 mL/min/{1.73_m2} (ref >59)

## 2020-10-07 ENCOUNTER — Telehealth: Payer: Self-pay

## 2020-10-07 NOTE — Telephone Encounter (Signed)
Spoke with pt and reviewed Device Instruction letter with pt.  See letter for complete details.  Pt verbalizes understanding and agrees with current plan.

## 2020-10-07 NOTE — Telephone Encounter (Signed)
Attempted phone call to pt to discuss ICD generator change.  Call went to voicemail.  Per Epic do not leave voicemail on any phone.  Will send pt a MyChart message re generator change.

## 2020-10-07 NOTE — Telephone Encounter (Signed)
Pt has been contacted ( see additional encounter dated 10/07/2020) and Device Instruction letter reviewed.  See letter for complete details.  Pt verbalizes understanding and agrees with current plan.

## 2020-10-10 ENCOUNTER — Telehealth: Payer: Self-pay

## 2020-10-10 NOTE — Telephone Encounter (Addendum)
Received voicemail from pt Unable to reach pt; left vm for pt to c/b if she still need assistance

## 2020-10-14 ENCOUNTER — Ambulatory Visit (INDEPENDENT_AMBULATORY_CARE_PROVIDER_SITE_OTHER): Payer: Medicaid Other | Admitting: Obstetrics

## 2020-10-14 ENCOUNTER — Other Ambulatory Visit: Payer: Self-pay

## 2020-10-14 ENCOUNTER — Other Ambulatory Visit (HOSPITAL_COMMUNITY)
Admission: RE | Admit: 2020-10-14 | Discharge: 2020-10-14 | Disposition: A | Discharge status: Home / Self Care | Payer: Medicaid Other | Source: Ambulatory Visit | Attending: Obstetrics | Admitting: Obstetrics

## 2020-10-14 ENCOUNTER — Encounter: Payer: Self-pay | Admitting: Obstetrics

## 2020-10-14 VITALS — BP 118/67 | HR 83 | Ht 64.0 in | Wt 122.0 lb

## 2020-10-14 DIAGNOSIS — B9689 Other specified bacterial agents as the cause of diseases classified elsewhere: Secondary | ICD-10-CM | POA: Diagnosis not present

## 2020-10-14 DIAGNOSIS — Z01419 Encounter for gynecological examination (general) (routine) without abnormal findings: Secondary | ICD-10-CM

## 2020-10-14 DIAGNOSIS — N760 Acute vaginitis: Secondary | ICD-10-CM | POA: Insufficient documentation

## 2020-10-14 DIAGNOSIS — N898 Other specified noninflammatory disorders of vagina: Secondary | ICD-10-CM

## 2020-10-14 DIAGNOSIS — Z113 Encounter for screening for infections with a predominantly sexual mode of transmission: Secondary | ICD-10-CM

## 2020-10-14 DIAGNOSIS — G43109 Migraine with aura, not intractable, without status migrainosus: Secondary | ICD-10-CM

## 2020-10-14 DIAGNOSIS — Z124 Encounter for screening for malignant neoplasm of cervix: Secondary | ICD-10-CM | POA: Insufficient documentation

## 2020-10-14 DIAGNOSIS — N946 Dysmenorrhea, unspecified: Secondary | ICD-10-CM | POA: Diagnosis not present

## 2020-10-14 DIAGNOSIS — N83201 Unspecified ovarian cyst, right side: Secondary | ICD-10-CM

## 2020-10-14 DIAGNOSIS — I519 Heart disease, unspecified: Secondary | ICD-10-CM

## 2020-10-14 DIAGNOSIS — Z3009 Encounter for other general counseling and advice on contraception: Secondary | ICD-10-CM

## 2020-10-14 DIAGNOSIS — N871 Moderate cervical dysplasia: Secondary | ICD-10-CM

## 2020-10-14 MED ORDER — METRONIDAZOLE 500 MG PO TABS: 500.0000 mg | ORAL_TABLET | Freq: Two times a day (BID) | ORAL | 0 refills | Status: DC

## 2020-10-14 MED ORDER — IBUPROFEN 800 MG PO TABS: ORAL_TABLET | ORAL | 5 refills | Status: DC

## 2020-10-14 MED ORDER — IBUPROFEN 800 MG PO TABS: 800.0000 mg | ORAL_TABLET | Freq: Three times a day (TID) | ORAL | 5 refills | Status: DC | PRN

## 2020-10-14 NOTE — Progress Notes (Signed)
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   Pt has upcoming procedure coming up to get defibrillator changed  CC: Vaginal Irritation.

## 2020-10-14 NOTE — Telephone Encounter (Signed)
Patient is requesting medication for BV.

## 2020-10-14 NOTE — Progress Notes (Signed)
Subjective:        Courtney Rice is a 30 y.o. female here for a routine exam.  Current complaints: Vaginal discharge and irritation, and heavy, painful periods.    Personal health questionnaire:  Is patient Ashkenazi Jewish, have a family history of breast and/or ovarian cancer: no Is there a family history of uterine cancer diagnosed at age < 28, gastrointestinal cancer, urinary tract cancer, family member who is a Personnel officer syndrome-associated carrier: no Is the patient overweight and hypertensive, family history of diabetes, personal history of gestational diabetes, preeclampsia or PCOS: no Is patient over 28, have PCOS,  family history of premature CHD under age 66, diabetes, smoke, have hypertension or peripheral artery disease:  no At any time, has a partner hit, kicked or otherwise hurt or frightened you?: no Over the past 2 weeks, have you felt down, depressed or hopeless?: no Over the past 2 weeks, have you felt little interest or pleasure in doing things?:no   Gynecologic History No LMP recorded. Contraception: none Last Pap: 2020  Results were: HGSIL  ( h/o LEEP in 2021 for HGSIL ). Last mammogram: n/a. Results were: n/a  Obstetric History OB History  Gravida Para Term Preterm AB Living  1 1 1     1   SAB IAB Ectopic Multiple Live Births          1    # Outcome Date GA Lbr Len/2nd Weight Sex Delivery Anes PTL Lv  1 Term 10/25/13 [redacted]w[redacted]d 06:09 / 00:21 6 lb 9.4 oz (2.988 kg) M Vag-Spont EPI, Local  LIV    Past Medical History:  Diagnosis Date  . AICD (automatic cardioverter/defibrillator) present   . Long Q-T syndrome Type 2    a. post partum syncope and VT PM;  b. 11/2013 s/p BSX SubQ ICD placement.  . Migraine   . Pre-syncope admitted 08/01/2015   a. felt to be 2/2 orthostatic hypotension - propranolol decreased.     Past Surgical History:  Procedure Laterality Date  . IMPLANTABLE CARDIOVERTER DEFIBRILLATOR IMPLANT  12-20-2013   BSX SubQ ICD implanted by Dr  12-22-2013  . IMPLANTABLE CARDIOVERTER DEFIBRILLATOR IMPLANT N/A 12/20/2013   Procedure: SUB Q ICD;  Surgeon: 12/22/2013, MD;  Location: [facility] CATH LAB;  Service: Cardiovascular;  Laterality: N/A;  . TONSILLECTOMY AND ADENOIDECTOMY  2011     Current Outpatient Medications:  .  ibuprofen (ADVIL) 800 MG tablet, Take 1 tablet (800 mg total) by mouth every 8 (eight) hours as needed., Disp: 30 tablet, Rfl: 5 .  metroNIDAZOLE (FLAGYL) 500 MG tablet, Take 1 tablet (500 mg total) by mouth 2 (two) times daily. (Patient not taking: Reported on 10/14/2020), Disp: 14 tablet, Rfl: 0 .  propranolol (INDERAL) 20 MG tablet, Take 20 mg by mouth as needed. (Patient not taking: Reported on 10/14/2020), Disp: , Rfl:  No Known Allergies  Social History   Tobacco Use  . Smoking status: Never Smoker  . Smokeless tobacco: Never Used  Substance Use Topics  . Alcohol use: No    Family History  Problem Relation Age of Onset  . Heart disease Mother   . Diabetes Mother   . Asthma Mother   . COPD Mother   . Lupus Mother   . Stroke Mother   . Hypertension Mother   . COPD Maternal Grandmother   . Heart disease Maternal Grandmother   . Breast cancer Maternal Aunt   . Hearing loss Neg Hx   . Heart attack Neg Hx  Review of Systems  Constitutional: negative for fatigue and weight loss Respiratory: negative for cough and wheezing Cardiovascular: negative for chest pain, fatigue and palpitations Gastrointestinal: negative for abdominal pain and change in bowel habits Musculoskeletal:negative for myalgias Neurological: negative for gait problems and tremors Behavioral/Psych: negative for abusive relationship, depression Endocrine: negative for temperature intolerance    Genitourinary:positive for heavy, painful menstrual periods, and vaginal discharge and irritation.  Negative for genital lesions, hot flashes, sexual problems  Integument/breast: negative for breast lump, breast tenderness, nipple discharge and  skin lesion(s)    Objective:       BP 118/67   Pulse 83   Ht 5\' 4"  (1.626 m)   Wt 122 lb (55.3 kg)   BMI 20.94 kg/m  General:   alert and no distress  Skin:   no rash or abnormalities  Lungs:   clear to auscultation bilaterally  Heart:   regular rate and rhythm, S1, S2 normal, no murmur, click, rub or gallop  Breasts:   normal without suspicious masses, skin or nipple changes or axillary nodes  Abdomen:  normal findings: no organomegaly, soft, non-tender and no hernia  Pelvis:  External genitalia: normal general appearance Urinary system: urethral meatus normal and bladder without fullness, nontender Vaginal: normal without tenderness, induration or masses Cervix: normal appearance Adnexa: fullness right adnexa, nontender Uterus: anteverted and non-tender, normal size   Lab Review Urine pregnancy test Labs reviewed yes Radiologic studies reviewed no  50% of 20 min visit spent on counseling and coordination of care.   Assessment:     1. Encounter for routine gynecological examination with Papanicolaou smear of cervix Rx: - Cytology - PAP  2. Vaginal discharge Rx: - Cervicovaginal ancillary only( [place])  3. Vaginal irritation - wet prep and cultures sent  4. Screening examination for STD (sexually transmitted disease) Rx: - Hepatitis C antibody - Hepatitis B surface antigen - HIV Antibody (routine testing w rflx) - RPR  5. Dysmenorrhea Rx - ibuprofen (ADVIL) 800 MG tablet; Take 1 tablet (800 mg total) by mouth every 8 (eight) hours monthly during menstruation.  Dispense: 30 tablet; Refill: 5  6. Cyst of right ovary Rx: - PELVIC COMPLETE WITH TRANSVAGINAL; Future  7. High grade squamous intraepithelial lesion (HGSIL), grade 2 CIN, on biopsy of cervix with clear margins - pap smear q 6 months per Dysplasia Protocol  8. Heart disease - Long Q-T Syndrome Type 2  9. Migraine with aura and without status migrainosus, not intractable AICD ( automatic  cardioverter / defibrillator ) present  10. Encounter for other general counseling or advice on contraception - advised against hormonal contraceptives because of history of heart disease and migraines.  ParaGuard IUD recommended, and condoms for STD prevention   Plan:    Education reviewed: calcium supplements, depression evaluation, low fat, low cholesterol diet, safe sex/STD prevention, self breast exams and weight bearing exercise. Contraception: declines contraception, but nonhormonal methods stressed. Follow up in: 1 year.   Meds ordered this encounter  Medications  . ibuprofen (ADVIL) 800 MG tablet    Sig: Take 1 tablet (800 mg total) by mouth every 8 (eight) hours as needed.    Dispense:  30 tablet    Refill:  5   Orders Placed This Encounter  Procedures  . US PELVIC COMPLETE WITH TRANSVAGINAL    Standing Status:   Future    Standing Expiration Date:   10/14/2021    Order Specific Question:   Reason for Exam (SYMPTOM  OR  DIAGNOSIS REQUIRED)    Answer:   Ovarian cyst - right    Order Specific Question:   Preferred imaging location?    Answer:   [facility]  . Hepatitis C antibody  . Hepatitis B surface antigen  . HIV Antibody (routine testing w rflx)  . RPR    Brock Bad, MD 10/14/2020 4:17 PM

## 2020-10-15 LAB — CERVICOVAGINAL ANCILLARY ONLY
Bacterial Vaginitis (gardnerella): POSITIVE — AB
Candida Glabrata: NEGATIVE
Candida Vaginitis: NEGATIVE
Chlamydia: NEGATIVE
Comment: NEGATIVE
Comment: NEGATIVE
Comment: NEGATIVE
Comment: NEGATIVE
Comment: NEGATIVE
Comment: NORMAL
Neisseria Gonorrhea: NEGATIVE
Trichomonas: NEGATIVE

## 2020-10-15 LAB — CYTOLOGY - PAP: Diagnosis: NEGATIVE

## 2020-10-15 LAB — HEPATITIS C ANTIBODY: Hep C Virus Ab: <0.1 s/co ratio (ref 0.0–0.9)

## 2020-10-15 LAB — HEPATITIS B SURFACE ANTIGEN: Hepatitis B Surface Ag: NEGATIVE

## 2020-10-15 LAB — HIV ANTIBODY (ROUTINE TESTING W REFLEX): HIV Screen 4th Generation wRfx: NONREACTIVE

## 2020-10-15 LAB — RPR: RPR Ser Ql: NONREACTIVE

## 2020-10-16 ENCOUNTER — Other Ambulatory Visit: Payer: Self-pay | Admitting: Obstetrics

## 2020-10-16 DIAGNOSIS — B9689 Other specified bacterial agents as the cause of diseases classified elsewhere: Secondary | ICD-10-CM

## 2020-10-16 DIAGNOSIS — N760 Acute vaginitis: Secondary | ICD-10-CM

## 2020-10-16 MED ORDER — METRONIDAZOLE 500 MG PO TABS: 500.0000 mg | ORAL_TABLET | Freq: Two times a day (BID) | ORAL | 0 refills | Status: DC

## 2020-10-22 ENCOUNTER — Ambulatory Visit: Admission: RE | Admit: 2020-10-22 | Payer: Medicaid Other | Source: Ambulatory Visit

## 2020-10-23 ENCOUNTER — Ambulatory Visit: Payer: Medicaid Other | Admitting: Obstetrics

## 2020-10-29 ENCOUNTER — Other Ambulatory Visit (HOSPITAL_COMMUNITY)
Admission: RE | Admit: 2020-10-29 | Discharge: 2020-10-29 | Disposition: A | Discharge status: Home / Self Care | Payer: Medicaid Other | Source: Ambulatory Visit | Attending: Internal Medicine | Admitting: Internal Medicine

## 2020-10-29 DIAGNOSIS — Z20822 Contact with and (suspected) exposure to covid-19: Secondary | ICD-10-CM | POA: Insufficient documentation

## 2020-10-29 DIAGNOSIS — Z01812 Encounter for preprocedural laboratory examination: Secondary | ICD-10-CM | POA: Insufficient documentation

## 2020-10-29 LAB — SARS CORONAVIRUS 2 (TAT 6-24 HRS): SARS Coronavirus 2: NEGATIVE

## 2020-10-31 ENCOUNTER — Ambulatory Visit (HOSPITAL_COMMUNITY)
Admission: RE | Admit: 2020-10-31 | Discharge: 2020-10-31 | Disposition: A | Discharge status: Home / Self Care | Payer: Medicaid Other | Attending: Internal Medicine | Admitting: Internal Medicine

## 2020-10-31 ENCOUNTER — Other Ambulatory Visit: Payer: Self-pay

## 2020-10-31 ENCOUNTER — Telehealth: Payer: Self-pay | Admitting: Internal Medicine

## 2020-10-31 ENCOUNTER — Ambulatory Visit (HOSPITAL_COMMUNITY)
Admission: RE | Disposition: A | Discharge status: Home / Self Care | Payer: Self-pay | Source: Home / Self Care | Attending: Internal Medicine

## 2020-10-31 ENCOUNTER — Encounter (HOSPITAL_COMMUNITY): Payer: Self-pay | Admitting: Internal Medicine

## 2020-10-31 ENCOUNTER — Ambulatory Visit (HOSPITAL_COMMUNITY): Payer: Medicaid Other | Admitting: Anesthesiology

## 2020-10-31 DIAGNOSIS — R55 Syncope and collapse: Secondary | ICD-10-CM | POA: Diagnosis not present

## 2020-10-31 DIAGNOSIS — Z8249 Family history of ischemic heart disease and other diseases of the circulatory system: Secondary | ICD-10-CM | POA: Diagnosis not present

## 2020-10-31 DIAGNOSIS — Z4502 Encounter for adjustment and management of automatic implantable cardiac defibrillator: Secondary | ICD-10-CM | POA: Insufficient documentation

## 2020-10-31 DIAGNOSIS — I4581 Long QT syndrome: Secondary | ICD-10-CM

## 2020-10-31 HISTORY — PX: SUBQ ICD CHANGEOUT: EP1235

## 2020-10-31 LAB — PREGNANCY, URINE: Preg Test, Ur: NEGATIVE

## 2020-10-31 SURGERY — SUBQ ICD CHANGEOUT
Anesthesia: General

## 2020-10-31 MED ORDER — OXYCODONE HCL 5 MG PO TABS: 5.0000 mg | ORAL_TABLET | Freq: Once | ORAL | Status: DC | PRN

## 2020-10-31 MED ORDER — PROPOFOL 10 MG/ML IV BOLUS
INTRAVENOUS | Status: DC | PRN
  Administered 2020-10-31: 140 mg via INTRAVENOUS

## 2020-10-31 MED ORDER — BUPIVACAINE HCL (PF) 0.25 % IJ SOLN
INTRAMUSCULAR | Status: AC
  Filled 2020-10-31: qty 30

## 2020-10-31 MED ORDER — LACTATED RINGERS IV SOLN: INTRAVENOUS | Status: DC | PRN

## 2020-10-31 MED ORDER — OXYCODONE HCL 5 MG/5ML PO SOLN: 5.0000 mg | Freq: Once | ORAL | Status: DC | PRN

## 2020-10-31 MED ORDER — SODIUM CHLORIDE 0.9 % IV SOLN
80.0000 mg | INTRAVENOUS | Status: AC
  Administered 2020-10-31: 80 mg

## 2020-10-31 MED ORDER — FENTANYL CITRATE (PF) 100 MCG/2ML IJ SOLN
INTRAMUSCULAR | Status: AC
  Filled 2020-10-31: qty 2

## 2020-10-31 MED ORDER — ACETAMINOPHEN 325 MG PO TABS
325.0000 mg | ORAL_TABLET | ORAL | Status: DC | PRN
  Administered 2020-10-31: 325 mg via ORAL
  Filled 2020-10-31 (×3): qty 1

## 2020-10-31 MED ORDER — PHENYLEPHRINE HCL-NACL 10-0.9 MG/250ML-% IV SOLN
INTRAVENOUS | Status: DC | PRN
  Administered 2020-10-31: 25 ug/min via INTRAVENOUS

## 2020-10-31 MED ORDER — LIDOCAINE HCL (CARDIAC) PF 100 MG/5ML IV SOSY
PREFILLED_SYRINGE | INTRAVENOUS | Status: DC | PRN
  Administered 2020-10-31: 40 mg via INTRAVENOUS

## 2020-10-31 MED ORDER — CHLORHEXIDINE GLUCONATE 4 % EX LIQD: 4.0000 "application " | Freq: Once | CUTANEOUS | Status: DC

## 2020-10-31 MED ORDER — DEXAMETHASONE SODIUM PHOSPHATE 10 MG/ML IJ SOLN
INTRAMUSCULAR | Status: DC | PRN
  Administered 2020-10-31: 4 mg via INTRAVENOUS

## 2020-10-31 MED ORDER — MIDAZOLAM HCL 2 MG/2ML IJ SOLN
INTRAMUSCULAR | Status: AC
  Filled 2020-10-31: qty 2

## 2020-10-31 MED ORDER — FENTANYL CITRATE (PF) 100 MCG/2ML IJ SOLN
INTRAMUSCULAR | Status: DC | PRN
  Administered 2020-10-31: 50 ug via INTRAVENOUS

## 2020-10-31 MED ORDER — BUPIVACAINE HCL (PF) 0.25 % IJ SOLN
INTRAMUSCULAR | Status: DC | PRN
  Administered 2020-10-31: 30 mL

## 2020-10-31 MED ORDER — ONDANSETRON HCL 4 MG/2ML IJ SOLN
INTRAMUSCULAR | Status: DC | PRN
  Administered 2020-10-31: 4 mg via INTRAVENOUS

## 2020-10-31 MED ORDER — PHENYLEPHRINE 40 MCG/ML (10ML) SYRINGE FOR IV PUSH (FOR BLOOD PRESSURE SUPPORT)
PREFILLED_SYRINGE | INTRAVENOUS | Status: DC | PRN
  Administered 2020-10-31: 120 ug via INTRAVENOUS
  Administered 2020-10-31: 80 ug via INTRAVENOUS

## 2020-10-31 MED ORDER — SODIUM CHLORIDE 0.9 % IV SOLN: INTRAVENOUS | Status: DC

## 2020-10-31 MED ORDER — MIDAZOLAM HCL 2 MG/2ML IJ SOLN
INTRAMUSCULAR | Status: DC | PRN
  Administered 2020-10-31: 2 mg via INTRAVENOUS

## 2020-10-31 MED ORDER — SODIUM CHLORIDE 0.9 % IV SOLN
INTRAVENOUS | Status: AC
  Filled 2020-10-31: qty 2

## 2020-10-31 MED ORDER — CEFAZOLIN SODIUM-DEXTROSE 2-4 GM/100ML-% IV SOLN
2.0000 g | INTRAVENOUS | Status: AC
  Administered 2020-10-31: 2 g via INTRAVENOUS

## 2020-10-31 MED ORDER — OXYCODONE-ACETAMINOPHEN 5-325 MG PO TABS: 1.0000 | ORAL_TABLET | Freq: Four times a day (QID) | ORAL | 0 refills | Status: DC | PRN

## 2020-10-31 MED ORDER — CEFAZOLIN SODIUM-DEXTROSE 2-4 GM/100ML-% IV SOLN
INTRAVENOUS | Status: AC
  Filled 2020-10-31: qty 100

## 2020-10-31 MED ORDER — SODIUM CHLORIDE 0.9 % IV SOLN: INTRAVENOUS | Status: AC

## 2020-10-31 MED ORDER — FENTANYL CITRATE (PF) 100 MCG/2ML IJ SOLN: 25.0000 ug | INTRAMUSCULAR | Status: DC | PRN

## 2020-10-31 SURGICAL SUPPLY — 4 items
CABLE SURGICAL S-101-97-12 (CABLE) ×2 IMPLANT
ICD SUBCU MRI EMBLEM A219 (ICD Generator) ×2 IMPLANT
PAD PRO RADIOLUCENT 2001M-C (PAD) ×4 IMPLANT
TRAY PACEMAKER INSERTION (PACKS) ×2 IMPLANT

## 2020-10-31 NOTE — Anesthesia Procedure Notes (Signed)
Procedure Name: LMA Insertion Date/Time: 10/31/2020 7:50 AM Performed by: Epifanio Lesches, CRNA Pre-anesthesia Checklist: Patient identified, Emergency Drugs available, Suction available and Patient being monitored Patient Re-evaluated:Patient Re-evaluated prior to induction Oxygen Delivery Method: Circle System Utilized Preoxygenation: Pre-oxygenation with 100% oxygen Induction Type: IV induction Ventilation: Mask ventilation without difficulty LMA: LMA inserted LMA Size: 4.0 Number of attempts: 1 Airway Equipment and Method: Bite block Placement Confirmation: positive ETCO2 Tube secured with: Tape Dental Injury: Teeth and Oropharynx as per pre-operative assessment

## 2020-10-31 NOTE — Anesthesia Preprocedure Evaluation (Signed)
Anesthesia Evaluation  Patient identified by MRN, date of birth, ID band Patient awake    Reviewed: Allergy & Precautions, NPO status , Patient's Chart, lab work & pertinent test results  Airway Mallampati: II  TM Distance: >3 FB Neck ROM: Full    Dental  (+) Teeth Intact, Dental Advisory Given   Pulmonary    breath sounds clear to auscultation       Cardiovascular  Rhythm:Regular Rate:Normal     Neuro/Psych    GI/Hepatic   Endo/Other    Renal/GU      Musculoskeletal   Abdominal   Peds  Hematology   Anesthesia Other Findings   Reproductive/Obstetrics                             Anesthesia Physical Anesthesia Plan  ASA: III  Anesthesia Plan: General   Post-op Pain Management:    Induction: Intravenous  PONV Risk Score and Plan: Ondansetron and Dexamethasone  Airway Management Planned: LMA  Additional Equipment:   Intra-op Plan:   Post-operative Plan:   Informed Consent: I have reviewed the patients History and Physical, chart, labs and discussed the procedure including the risks, benefits and alternatives for the proposed anesthesia with the patient or authorized representative who has indicated his/her understanding and acceptance.     Dental advisory given  Plan Discussed with: CRNA and Anesthesiologist  Anesthesia Plan Comments:         Anesthesia Quick Evaluation  

## 2020-10-31 NOTE — Interval H&P Note (Signed)
History and Physical Interval Note:  10/31/2020 9:07 AM  Courtney Rice  has presented today for surgery, with the diagnosis of long qt syndrome.  The various methods of treatment have been discussed with the patient and family. After consideration of risks, benefits and other options for treatment, the patient has consented to  Procedure(s): SUBQ ICD CHANGEOUT (N/A) as a surgical intervention.  The patient's history has been reviewed, patient examined, no change in status, stable for surgery.  I have reviewed the patient's chart and labs.  Questions were answered to the patient's satisfaction.     Sherryl Manges  Reviewed risks and benefits  agreeable to proceeding

## 2020-10-31 NOTE — Telephone Encounter (Signed)
  Patient states that she had her defibrillator switched out and Dr Graciela Husbands told her they would give her a prescription for pain medication but she did not get it. She would like to know if she could get a prescription sent to [facility] #[id number] - [place], [place] - [address] AT [facility] OF [address] & [facility] [facility]

## 2020-10-31 NOTE — Progress Notes (Signed)
Called into [facility] - patient requested being out of work 1 week. Per Dr. Graciela Husbands that was fine.  Patient was given tylenol 325mg .  Patient stated it did not help pain but she wanted to go home.  (paged Renee x2 no answer) encouraged patient to call office if she continued to have a lot of pain.

## 2020-10-31 NOTE — Discharge Instructions (Signed)
Dermabond will come off in the next 10-14 days.  Keep wound dry until tomorrow evening.  No driving x 4 days. Wound check in office as scheduled.  Call office for any problems, questions or concerns.  Call MD for any bleeding, drainage, swelling, or redness.

## 2020-10-31 NOTE — Progress Notes (Signed)
Patient underwent an uncomplicated S-ICD generator change today with Dr. Graciela Husbands. She requested her nurse ask Dr. Graciela Husbands for pain medication for home, though had to leave with her ride prior to Dr. Graciela Husbands getting out of the case he was in and prior to me responding to the page.  I spoke to Dr. Graciela Husbands who requested that I provide Percocet to the patient for pain management.  I have spoken to the patient via telephone and reviewed opioid/narcotic safety with her. She denies pregnancy or breast feeding currently  I have reviewed PDMP and she has no historical or active narcotic or controlled substance prescriptions on record.  She recalls years ago using percocet after her initial ICD implant and tolerated well..  I have sent in percocet 5/325mg  Q6 for severe pain, #10 no refills to her listed pharmacy  Francis Dowse, PA-C

## 2020-10-31 NOTE — Telephone Encounter (Signed)
Pt contacted by hospital PA and order submitted.  No action needed

## 2020-10-31 NOTE — Transfer of Care (Signed)
Immediate Anesthesia Transfer of Care Note  Patient: Courtney Rice  Procedure(s) Performed: SUBQ ICD CHANGEOUT (N/A )  Patient Location: PACU  Anesthesia Type:General  Level of Consciousness: awake, alert  and oriented  Airway & Oxygen Therapy: Patient Spontanous Breathing and Patient connected to nasal cannula oxygen  Post-op Assessment: Report given to RN and Post -op Vital signs reviewed and stable  Post vital signs: Reviewed and stable  Last Vitals:  Vitals Value Taken Time  BP 105/26 10/31/20 0925  Temp 36.6 C 10/31/20 0926  Pulse 72 10/31/20 0927  Resp 13 10/31/20 0928  SpO2 100 % 10/31/20 0927  Vitals shown include unvalidated device data.  Last Pain:  Vitals:   10/31/20 0926  TempSrc: Temporal  PainSc:          Complications: No complications documented.

## 2020-10-31 NOTE — Anesthesia Postprocedure Evaluation (Signed)
Anesthesia Post Note  Patient: [name]  Procedure(s) Performed: SUBQ ICD CHANGEOUT (N/A )     Patient location during evaluation: Cath Lab Anesthesia Type: General Level of consciousness: awake and alert Pain management: pain level controlled Vital Signs Assessment: post-procedure vital signs reviewed and stable Respiratory status: spontaneous breathing, nonlabored ventilation, respiratory function stable and patient connected to nasal cannula oxygen Cardiovascular status: blood pressure returned to baseline and stable Postop Assessment: no apparent nausea or vomiting Anesthetic complications: no   No complications documented.  Last Vitals:  Vitals:   10/31/20 0956 10/31/20 1000  BP:  102/61  Pulse:  69  Resp:  16  Temp: 36.8 C   SpO2:  100%    Last Pain:  Vitals:   10/31/20 1014  TempSrc:   PainSc: 6                  Tameem Pullara COKER

## 2020-11-02 MED FILL — Bupivacaine HCl Preservative Free (PF) Inj 0.25%: INTRAMUSCULAR | Qty: 30 | Status: AC

## 2020-11-11 ENCOUNTER — Other Ambulatory Visit: Payer: Self-pay

## 2020-11-11 ENCOUNTER — Ambulatory Visit (INDEPENDENT_AMBULATORY_CARE_PROVIDER_SITE_OTHER): Payer: Medicaid Other | Admitting: Emergency Medicine

## 2020-11-11 DIAGNOSIS — I4581 Long QT syndrome: Secondary | ICD-10-CM

## 2020-11-11 LAB — CUP PACEART INCLINIC DEVICE CHECK
Date Time Interrogation Session: 20220412111830
Implantable Lead Implant Date: 20150521
Implantable Lead Location: 753862
Implantable Lead Model: 3400
Implantable Pulse Generator Implant Date: 20220401
Pulse Gen Serial Number: 157706

## 2020-11-11 NOTE — Progress Notes (Signed)
Wound check appointment. Majority Dermabond removed. Wound without redness.  Swelling noted, patient educated to monitor and report increase.  Incision edges approximated, wound well healed.  Subcutaneous ICD check in clinic. 0 untreated episodes; 0 treated episodes; 0 shocks delivered. Electrode impedance status okay, measuring 65 ohms today. No programming changes. Remaining longevity to ERI 100%.

## 2021-02-16 DIAGNOSIS — Z95810 Presence of automatic (implantable) cardiac defibrillator: Secondary | ICD-10-CM | POA: Insufficient documentation

## 2021-02-18 ENCOUNTER — Other Ambulatory Visit: Payer: Self-pay

## 2021-02-18 ENCOUNTER — Ambulatory Visit (INDEPENDENT_AMBULATORY_CARE_PROVIDER_SITE_OTHER): Payer: Medicaid Other | Admitting: Internal Medicine

## 2021-02-18 ENCOUNTER — Encounter: Payer: Self-pay | Admitting: Internal Medicine

## 2021-02-18 VITALS — BP 106/66 | HR 82 | Ht 64.0 in | Wt 123.0 lb

## 2021-02-18 DIAGNOSIS — Z95810 Presence of automatic (implantable) cardiac defibrillator: Secondary | ICD-10-CM

## 2021-02-18 DIAGNOSIS — I4581 Long QT syndrome: Secondary | ICD-10-CM

## 2021-02-18 DIAGNOSIS — I4729 Other ventricular tachycardia: Secondary | ICD-10-CM

## 2021-02-18 DIAGNOSIS — I472 Ventricular tachycardia: Secondary | ICD-10-CM

## 2021-02-18 NOTE — Progress Notes (Signed)
Patient ID: Courtney Rice, female   DOB: Jan 12, 1991, 30 y.o.   MRN: [id number]      Patient Care Team: Pcp, No as PCP - General Marinus Maw, MD as PCP - Electrophysiology (Cardiology)     Courtney Rice is a 30 y.o. female presents seen in followup for ICD implanted for post partum syncope and VT-PM  and genetic diagnosis LQTS-2   Underwent contact device.  Replacement 4/22  Today, the patient denies chest pain, shortness of breath, nocturnal dyspnea, orthopnea or peripheral edema.  There have been no palpitations, lightheadedness or syncope.     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 post partum syncope and VT PM;  b. 11/2013 s/p BSX SubQ ICD placement.   Migraine    Pre-syncope admitted 08/01/2015   a. felt to be 2/2 orthostatic hypotension - propranolol decreased.     Past Surgical History:  Procedure Laterality Date   IMPLANTABLE CARDIOVERTER DEFIBRILLATOR IMPLANT  12-20-2013   BSX SubQ ICD implanted by Dr Graciela Husbands   IMPLANTABLE CARDIOVERTER DEFIBRILLATOR IMPLANT N/A 12/20/2013   Procedure: SUB Q ICD;  Surgeon: Duke Salvia, MD;  Location: [facility] CATH LAB;  Service: Cardiovascular;  Laterality: N/A;   SUBQ ICD CHANGEOUT N/A 10/31/2020   Procedure: SUBQ ICD CHANGEOUT;  Surgeon: Duke Salvia, MD;  Location: [facility] INVASIVE CV LAB;  Service: Cardiovascular;  Laterality: N/A;   TONSILLECTOMY AND ADENOIDECTOMY  2011    Current Meds  Medication Sig   ibuprofen (ADVIL) 800 MG tablet Take 1 tablet every 8 hours during menstruation, every month.    No Known Allergies  Review of Systems negative except from HPI and PMH  Physical Exam: BP 106/66   Pulse 82    Ht 5\' 4"  (1.626 m)   Wt 123 lb (55.8 kg)   SpO2 99%   BMI 21.11 kg/m  Well developed and well nourished in no acute distress HENT normal Neck supple with JVP-flat Lungs Clear Device pocket well healed; without hematoma or erythema.  There is no tethering  Regular rate and rhythm, No gallop No  murmur Abd-soft with active BS No Clubbing cyanosis No edema Skin-warm and dry A & Oriented  Grossly normal sensory and motor function  ECG sinus @ 82 14/08/44 (500)  Assessment and  Plan Long QT syndrome-2  ICD-subcutaneous  VT-polymorphic associated with syncope postpartum  Doing well-- no med changes  Normal device function   Site well healed      Current medicines are reviewed at length with the patient today .  The patient does notsin have concerns regarding medicines.   I,Stephanie Williams,acting as a 16/08/44 for Neurosurgeon, MD.,have documented all relevant documentation on the behalf of Sherryl Manges, MD,as directed by  Sherryl Manges, MD while in the presence of Sherryl Manges, MD.  I, Sherryl Manges, MD, have reviewed all documentation for this  visit. The documentation on 02/18/21 for the exam, diagnosis, procedures, and orders are all accurate and complete.

## 2021-02-18 NOTE — Patient Instructions (Signed)
Medication Instructions:  Your physician recommends that you continue on your current medications as directed. Please refer to the Current Medication list given to you today.  *If you need a refill on your cardiac medications before your next appointment, please call your pharmacy*   Lab Work: None ordered.  If you have labs (blood work) drawn today and your tests are completely normal, you will receive your results only by: MyChart Message (if you have MyChart) OR A paper copy in the mail If you have any lab test that is abnormal or we need to change your treatment, we will call you to review the results.   Testing/Procedures: None ordered.    Follow-Up: At [facility], you and your health needs are our priority.  As part of our continuing mission to provide you with exceptional heart care, we have created designated Provider Care Teams.  These Care Teams include your primary Cardiologist (physician) and Advanced Practice Providers (APPs -  Physician Assistants and Nurse Practitioners) who all work together to provide you with the care you need, when you need it.  We recommend signing up for the patient portal called "MyChart".  Sign up information is provided on this After Visit Summary.  MyChart is used to connect with patients for Virtual Visits (Telemedicine).  Patients are able to view lab/test results, encounter notes, upcoming appointments, etc.  Non-urgent messages can be sent to your provider as well.   To learn more about what you can do with MyChart, go to [email or web address].    Your next appointment:   6 month(s)  The format for your next appointment:   In Person  Provider:   You will see one of the following Advanced Practice Providers on your designated Care Team:   Renee Ursuy, PA-C Michael "Andy" Tillery, PA-C   

## 2021-04-13 ENCOUNTER — Other Ambulatory Visit: Payer: Self-pay

## 2021-04-13 DIAGNOSIS — N760 Acute vaginitis: Secondary | ICD-10-CM

## 2021-04-13 DIAGNOSIS — B9689 Other specified bacterial agents as the cause of diseases classified elsewhere: Secondary | ICD-10-CM

## 2021-04-13 MED ORDER — TERCONAZOLE 0.8 % VA CREA: 1.0000 | TOPICAL_CREAM | Freq: Every day | VAGINAL | 0 refills | Status: DC

## 2021-04-13 MED ORDER — METRONIDAZOLE 500 MG PO TABS: 500.0000 mg | ORAL_TABLET | Freq: Two times a day (BID) | ORAL | 0 refills | Status: DC

## 2021-04-13 NOTE — Progress Notes (Signed)
Pt sent mychart message requesting rx for a "yeast" infection. Terazol 3 sent to the pharmacy per protocol.

## 2021-04-14 ENCOUNTER — Other Ambulatory Visit: Payer: Self-pay

## 2021-04-14 MED ORDER — FLUCONAZOLE 150 MG PO TABS: 150.0000 mg | ORAL_TABLET | Freq: Once | ORAL | 0 refills | Status: DC

## 2021-04-14 NOTE — Telephone Encounter (Signed)
Patient requested to have diflucan tablet called into her pharmacy.

## 2021-06-12 DIAGNOSIS — M542 Cervicalgia: Secondary | ICD-10-CM | POA: Insufficient documentation

## 2021-06-12 DIAGNOSIS — S134XXA Sprain of ligaments of cervical spine, initial encounter: Secondary | ICD-10-CM | POA: Insufficient documentation

## 2021-06-23 ENCOUNTER — Encounter: Payer: Self-pay | Admitting: Obstetrics

## 2021-06-24 ENCOUNTER — Other Ambulatory Visit: Payer: Self-pay | Admitting: *Deleted

## 2021-06-24 MED ORDER — FLUCONAZOLE 150 MG PO TABS: 150.0000 mg | ORAL_TABLET | Freq: Once | ORAL | 0 refills | Status: AC

## 2021-06-24 NOTE — Progress Notes (Signed)
Diflucan sent for yeast See pt email encounter

## 2021-08-18 ENCOUNTER — Other Ambulatory Visit: Payer: Self-pay | Admitting: Obstetrics

## 2021-08-18 DIAGNOSIS — B9689 Other specified bacterial agents as the cause of diseases classified elsewhere: Secondary | ICD-10-CM

## 2021-08-18 DIAGNOSIS — N760 Acute vaginitis: Secondary | ICD-10-CM

## 2021-08-31 MED ORDER — METRONIDAZOLE 500 MG PO TABS: 500.0000 mg | ORAL_TABLET | Freq: Two times a day (BID) | ORAL | 0 refills | Status: DC

## 2021-09-04 ENCOUNTER — Other Ambulatory Visit (HOSPITAL_COMMUNITY): Payer: Self-pay | Admitting: Orthopedic Surgery

## 2021-09-04 ENCOUNTER — Other Ambulatory Visit: Payer: Self-pay | Admitting: Orthopedic Surgery

## 2021-09-04 DIAGNOSIS — M542 Cervicalgia: Secondary | ICD-10-CM

## 2021-09-09 ENCOUNTER — Telehealth: Payer: Self-pay

## 2021-09-09 NOTE — Telephone Encounter (Signed)
Patient called and reports her device beeped yesterday while working at her computer. Denies any further beeping, symptoms or ICD shocks. Spoke to [employer] with [employer] and reports SICD will beep 16 times every 6 hours. Patient denies any beeping since the initial. Patient currently does not do remote monitoring due to previously not having insurance per joey. Patient voices she has insurance now and would like to start. Offered patient to come to clinic today for a check, not able to per patient. Apt. Made for patient 09/11/21 @ 9:30 am. Joey sent message and advised to bring a remote box to help set up for patient. Remotes scheduled and patient made aware. Patient advised if she were to have any symptoms to call the device clinic, [phone]. Advised if she were to receive any shocks to go straight to the ED, do not drive, call [phone]. Patient apperceive of assistance and verbalized understanding.

## 2021-09-09 NOTE — Telephone Encounter (Signed)
The patient ICD was beeping. She do not have a monitor. She only do in clinic appointments. I let her speak with Leigh, rn.

## 2021-09-11 ENCOUNTER — Ambulatory Visit (INDEPENDENT_AMBULATORY_CARE_PROVIDER_SITE_OTHER): Payer: BC Managed Care – PPO

## 2021-09-11 ENCOUNTER — Other Ambulatory Visit: Payer: Self-pay

## 2021-09-11 ENCOUNTER — Other Ambulatory Visit: Payer: Self-pay | Admitting: Obstetrics

## 2021-09-11 DIAGNOSIS — I4581 Long QT syndrome: Secondary | ICD-10-CM | POA: Diagnosis not present

## 2021-09-11 LAB — CUP PACEART INCLINIC DEVICE CHECK
Date Time Interrogation Session: 20230210095735
Implantable Lead Implant Date: 20150521
Implantable Lead Location: 753862
Implantable Lead Model: 3400
Implantable Pulse Generator Implant Date: 20220401
Pulse Gen Serial Number: 157706

## 2021-09-11 NOTE — Progress Notes (Signed)
Subcutaneous ICD check in clinic. 0 untreated episodes; 0 treated episodes; 0 shocks delivered. Electrode impedance status okay- 75 ohms. No programming changes. Remaining longevity to ERI 92%.  Patient provided remote monitor today, next scheduled check 12/14/21.

## 2021-09-22 ENCOUNTER — Other Ambulatory Visit: Payer: Self-pay

## 2021-09-22 ENCOUNTER — Ambulatory Visit (INDEPENDENT_AMBULATORY_CARE_PROVIDER_SITE_OTHER): Payer: BC Managed Care – PPO | Admitting: Obstetrics

## 2021-09-22 ENCOUNTER — Encounter: Payer: Self-pay | Admitting: Obstetrics

## 2021-09-22 ENCOUNTER — Other Ambulatory Visit (HOSPITAL_COMMUNITY)
Admission: RE | Admit: 2021-09-22 | Discharge: 2021-09-22 | Disposition: A | Discharge status: Home / Self Care | Payer: BC Managed Care – PPO | Source: Ambulatory Visit | Attending: Obstetrics | Admitting: Obstetrics

## 2021-09-22 VITALS — BP 125/78 | HR 84 | Wt 127.0 lb

## 2021-09-22 DIAGNOSIS — Z131 Encounter for screening for diabetes mellitus: Secondary | ICD-10-CM | POA: Diagnosis not present

## 2021-09-22 DIAGNOSIS — Z113 Encounter for screening for infections with a predominantly sexual mode of transmission: Secondary | ICD-10-CM | POA: Insufficient documentation

## 2021-09-22 DIAGNOSIS — N898 Other specified noninflammatory disorders of vagina: Secondary | ICD-10-CM | POA: Insufficient documentation

## 2021-09-22 NOTE — Progress Notes (Signed)
Pt states she feels she has been getting recurrent yeast infections.   Pt request std testing today.

## 2021-09-22 NOTE — Progress Notes (Signed)
Patient ID: Courtney Rice, female   DOB: Mar 20, 1991, 31 y.o.   MRN: [id number]  Chief Complaint  Patient presents with   Vaginitis    HPI Courtney Rice is a 31 y.o. female.  Vaginal discharge  Recently treated for yeast infection.  Has history of recurrent yeast infections. HPI  Past Medical History:  Diagnosis Date   AICD (automatic cardioverter/defibrillator) present    Long Q-T syndrome Type 2    a. post partum syncope and VT PM;  b. 11/2013 s/p BSX SubQ ICD placement.   Migraine    Pre-syncope admitted 08/01/2015   a. felt to be 2/2 orthostatic hypotension - propranolol decreased.     Past Surgical History:  Procedure Laterality Date   IMPLANTABLE CARDIOVERTER DEFIBRILLATOR IMPLANT  12-20-2013   BSX SubQ ICD implanted by Dr Graciela Husbands   IMPLANTABLE CARDIOVERTER DEFIBRILLATOR IMPLANT N/A 12/20/2013   Procedure: SUB Q ICD;  Surgeon: Duke Salvia, MD;  Location: [facility] CATH LAB;  Service: Cardiovascular;  Laterality: N/A;   SUBQ ICD CHANGEOUT N/A 10/31/2020   Procedure: SUBQ ICD CHANGEOUT;  Surgeon: Duke Salvia, MD;  Location: [facility] INVASIVE CV LAB;  Service: Cardiovascular;  Laterality: N/A;   TONSILLECTOMY AND ADENOIDECTOMY  2011    Family History  Problem Relation Age of Onset   Heart disease Mother    Diabetes Mother    Asthma Mother    COPD Mother    Lupus Mother    Stroke Mother    Hypertension Mother    COPD Maternal Grandmother    Heart disease Maternal Grandmother    Breast cancer Maternal Aunt    Hearing loss Neg Hx    Heart attack Neg Hx     Social History Social History   Tobacco Use   Smoking status: Never   Smokeless tobacco: Never  Vaping Use   Vaping Use: Never used  Substance Use Topics   Alcohol use: No   Drug use: No    No Known Allergies  Current Outpatient Medications  Medication Sig Dispense Refill   fluconazole (DIFLUCAN) 150 MG tablet TAKE 1 TABLET BY MOUTH ONCE 1 tablet 0   ibuprofen (ADVIL) 800 MG tablet Take 1 tablet every 8  hours during menstruation, every month. 30 tablet 5   No current facility-administered medications for this visit.    Review of Systems Review of Systems Constitutional: negative for fatigue and weight loss Respiratory: negative for cough and wheezing Cardiovascular: negative for chest pain, fatigue and palpitations Gastrointestinal: negative for abdominal pain and change in bowel habits Genitourinary: positive for vaginal discharge with irritation Integument/breast: negative for nipple discharge Musculoskeletal:negative for myalgias Neurological: negative for gait problems and tremors Behavioral/Psych: negative for abusive relationship, depression Endocrine: negative for temperature intolerance      Blood pressure 125/78, pulse 84, weight 127 lb (57.6 kg), last menstrual period 09/02/2021.  Physical Exam Physical Exam General:   Alert and no distress  Skin:   no rash or abnormalities  Lungs:   clear to auscultation bilaterally  Heart:   regular rate and rhythm, S1, S2 normal, no murmur, click, rub or gallop  Breasts:   normal without suspicious masses, skin or nipple changes or axillary nodes  Abdomen:  normal findings: no organomegaly, soft, non-tender and no hernia  Pelvis:  External genitalia: normal general appearance Urinary system: urethral meatus normal and bladder without fullness, nontender Vaginal: normal without tenderness, induration or masses Cervix: normal appearance Adnexa: normal bimanual exam Uterus: anteverted and non-tender, normal size  I have spent a total of 20 minutes of face-to-face time, excluding clinical staff time, reviewing notes and preparing to see patient, ordering tests and/or medications, and counseling the patient.   Data Reviewed Wet prep   Assessment      1. Vaginal discharge Rx: - Cervicovaginal ancillary only( [place])  2. Vaginal irritation - probable yeast infection.  Has taken Diflucan a few weeks ago - she has recurrent  yeast infections.  May need Boric Acid in the future  3. Screening for diabetes mellitus Rx: - Hemoglobin A1c   Plan    Orders Placed This Encounter  Procedures   Hemoglobin A1c     Brock Bad, MD 09/22/2021 11:26 AM

## 2021-09-23 LAB — HEMOGLOBIN A1C
Est. average glucose Bld gHb Est-mCnc: 108 mg/dL
Hgb A1c MFr Bld: 5.4 % (ref 4.8–5.6)

## 2021-09-23 LAB — CERVICOVAGINAL ANCILLARY ONLY
Bacterial Vaginitis (gardnerella): POSITIVE — AB
Candida Glabrata: NEGATIVE
Candida Vaginitis: NEGATIVE
Chlamydia: NEGATIVE
Comment: NEGATIVE
Comment: NEGATIVE
Comment: NEGATIVE
Comment: NEGATIVE
Comment: NEGATIVE
Comment: NORMAL
Neisseria Gonorrhea: NEGATIVE
Trichomonas: NEGATIVE

## 2021-09-24 ENCOUNTER — Other Ambulatory Visit: Payer: Self-pay | Admitting: Obstetrics

## 2021-09-24 ENCOUNTER — Encounter: Payer: Self-pay | Admitting: *Deleted

## 2021-09-24 DIAGNOSIS — B9689 Other specified bacterial agents as the cause of diseases classified elsewhere: Secondary | ICD-10-CM

## 2021-09-24 MED ORDER — METRONIDAZOLE 500 MG PO TABS: 500.0000 mg | ORAL_TABLET | Freq: Two times a day (BID) | ORAL | 2 refills | Status: DC

## 2021-09-24 NOTE — Progress Notes (Signed)
TC to inform of BV and RX Flagyl. Patient verbalized understanding. MyChart message with information on BV sent.

## 2021-09-28 ENCOUNTER — Telehealth: Payer: Self-pay | Admitting: Internal Medicine

## 2021-09-28 NOTE — Telephone Encounter (Signed)
I spoke with the patient and let her know that her monitor was updating. I gave her the number to [employer] tech support in case it happens again.

## 2021-09-28 NOTE — Telephone Encounter (Signed)
°  1. Has your device fired? no  2. Is you device beeping? no  3. Are you experiencing draining or swelling at device site? no  4. Are you calling to see if we received your device transmission? no  5. Have you passed out? no  Patient states the lights on her device are blinking.  Please route to Device Clinic Pool

## 2021-10-01 ENCOUNTER — Telehealth: Payer: Self-pay | Admitting: Internal Medicine

## 2021-10-01 NOTE — Telephone Encounter (Signed)
Remote reviewed and reports normal device function. No events noted since last transmission on 09/11/21. Patient called made aware. States she has felt well since and no further events. Advised patient to call to follow up with PCP. Patient voiced understanding and agreeable to plan. ? ? ? ? ? ?

## 2021-10-01 NOTE — Telephone Encounter (Signed)
The patient would like the nurse to call her to discuss her transmission.  ?

## 2021-10-01 NOTE — Telephone Encounter (Signed)
? ?  STAT if patient feels like he/she is going to faint  ? ?Are you dizzy now? No  ? ?Do you feel faint or have you passed out? Feel faint last Saturday ? ?Do you have any other symptoms? Lightheadedness and palpitations ? ?Have you checked your HR and BP (record if available)? None ? ? ?Pt said she is trying to send manual transmission and not sure if its going through, she also mention last Saturday she felt so lightheaded she almost passed out ? ?

## 2021-10-08 ENCOUNTER — Ambulatory Visit (INDEPENDENT_AMBULATORY_CARE_PROVIDER_SITE_OTHER): Payer: BC Managed Care – PPO | Admitting: Internal Medicine

## 2021-10-08 ENCOUNTER — Encounter: Payer: Self-pay | Admitting: Internal Medicine

## 2021-10-08 ENCOUNTER — Other Ambulatory Visit: Payer: Self-pay

## 2021-10-08 VITALS — BP 118/80 | HR 73 | Temp 98.2°F | Resp 16 | Ht 64.0 in | Wt 126.0 lb

## 2021-10-08 DIAGNOSIS — G43709 Chronic migraine without aura, not intractable, without status migrainosus: Secondary | ICD-10-CM | POA: Diagnosis not present

## 2021-10-08 DIAGNOSIS — F321 Major depressive disorder, single episode, moderate: Secondary | ICD-10-CM | POA: Diagnosis not present

## 2021-10-08 MED ORDER — UBRELVY 50 MG PO TABS: 1.0000 | ORAL_TABLET | ORAL | 0 refills | Status: DC | PRN

## 2021-10-08 MED ORDER — DULOXETINE HCL 30 MG PO CPEP: 30.0000 mg | ORAL_CAPSULE | Freq: Every day | ORAL | 0 refills | Status: DC

## 2021-10-08 MED ORDER — TRAZODONE HCL 50 MG PO TABS: 50.0000 mg | ORAL_TABLET | Freq: Every day | ORAL | 0 refills | Status: DC
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
## 2021-10-08 NOTE — Patient Instructions (Signed)
Major Depressive Disorder, Adult °Major depressive disorder (MDD) is a mental health condition. It may also be called clinical depression or unipolar depression. MDD causes symptoms of sadness, hopelessness, and loss of interest in things. These symptoms last most of the day, almost every day, for 2 weeks. MDD can also cause physical symptoms. It can interfere with relationships and with everyday activities, such as work, school, and activities that are usually pleasant. °MDD may be mild, moderate, or severe. It may be single-episode MDD, which happens once, or recurrent MDD, which may occur multiple times. °What are the causes? °The exact cause of this condition is not known. MDD is most likely caused by a combination of things, which may include: °Your personality traits. °Learned or conditioned behaviors or thoughts or feelings that reinforce negativity. °Any alcohol or substance misuse. °Long-term (chronic) physical or mental health illness. °Going through a traumatic experience or major life changes. °What increases the risk? °The following factors may make someone more likely to develop MDD: °A family history of depression. °Being a woman. °Troubled family relationships. °Abnormally low levels of certain brain chemicals. °Traumatic or painful events in childhood, especially abuse or loss of a parent. °A lot of stress from life experiences, such as poor living conditions or discrimination. °Chronic physical illness or other mental health disorders. °What are the signs or symptoms? °The main symptoms of MDD usually include: °Constant depressed or irritable mood. °A loss of interest in things and activities. °Other symptoms include: °Sleeping or eating too much or too little. °Unexplained weight gain or weight loss. °Tiredness or low energy. °Being agitated, restless, or weak. °Feeling hopeless, worthless, or guilty. °Trouble thinking clearly or making decisions. °Thoughts of suicide or thoughts of harming  others. °Isolating oneself or avoiding other people or activities. °Trouble completing tasks, work, or any normal obligations. °Severe symptoms of this condition may include: °Psychotic depression.This may include false beliefs, or delusions. It may also include seeing, hearing, tasting, smelling, or feeling things that are not real (hallucinations). °Chronic depression or persistent depressive disorder. This is low-level depression that lasts for at least 2 years. °Melancholic depression, or feeling extremely sad and hopeless. °Catatonic depression, which includes trouble speaking and trouble moving. °How is this diagnosed? °This condition may be diagnosed based on: °Your symptoms. °Your medical and mental health history. You may be asked questions about your lifestyle, including any drug and alcohol use. °A physical exam. °Blood tests to rule out other conditions. °MDD is confirmed if you have the following symptoms most of the day, nearly every day, in a 2-week period: °Either a depressed mood or loss of interest. °At least four other MDD symptoms. °How is this treated? °This condition is usually treated by mental health professionals, such as psychologists, psychiatrists, and clinical social workers. You may need more than one type of treatment. Treatment may include: °Psychotherapy, also called talk therapy or counseling. Types of psychotherapy include: °Cognitive behavioral therapy (CBT). This teaches you to recognize unhealthy feelings, thoughts, and behaviors, and replace them with positive thoughts and actions. °Interpersonal therapy (IPT). This helps you to improve the way you communicate with others or relate to them. °Family therapy. This treatment includes members of your family. °Medicines to treat anxiety and depression. These medicines help to balance the brain chemicals that affect your emotions. °Lifestyle changes. You may be asked to: °Limit alcohol use and avoid drug use. °Get regular  exercise. °Get plenty of sleep. °Make healthy eating choices. °Spend more time outdoors. °Brain stimulation. This may   be done if symptoms are very severe and other treatments have not worked. Examples of this treatment are electroconvulsive therapy and transcranial magnetic stimulation. °Follow these instructions at home: °Activity °Exercise regularly and spend time outdoors. °Find activities that you enjoy doing, and make time to do them. °Find healthy ways to manage stress, such as: °Meditation or deep breathing. °Spending time in nature. °Journaling. °Return to your normal activities as told by your health care provider. Ask your health care provider what activities are safe for you. °Alcohol and drug use °If you drink alcohol: °Limit how much you use to: °0-1 drink a day for women who are not pregnant. °0-2 drinks a day for men. °Be aware of how much alcohol is in your drink. In the [place] one drink equals one 12 oz bottle of beer (355 mL), one 5 oz glass of wine (148 mL), or one 1½ oz glass of hard liquor (44 mL). °Discuss your alcohol use with your health care provider. Alcohol can affect any antidepressant medicines you are taking. °Discuss any drug use with your health care provider. °General instructions ° °Take over-the-counter and prescription medicines only as told by your health care provider. °Eat a healthy diet and get plenty of sleep. °Consider joining a support group. Your health care provider may be able to recommend one. °Keep all follow-up visits as told by your health care provider. This is important. °Where to find more information °National Alliance on Mental Illness: www.nami.org °[place] National Institute of Mental Health: www.nimh.nih.gov °Contact a health care provider if: °Your symptoms get worse. °You develop new symptoms. °Get help right away if: °You self-harm. °You have serious thoughts about hurting yourself or others. °You hallucinate. °If you ever feel like you may hurt yourself or  others, or have thoughts about taking your own life, get help right away. Go to your nearest emergency department or: °Call your local emergency services (911 in the [place] °Call a suicide crisis helpline, such as the National Suicide Prevention Lifeline at 1-800-273-8255 or 988 in the [place] This is open 24 hours a day in the [place] °Text the Crisis Text Line at 741741 (in the [place] °Summary °Major depressive disorder (MDD) is a mental health condition. MDD causes symptoms of sadness, hopelessness, and loss of interest in things. These symptoms last most of the day, almost every day, for 2 weeks. °The symptoms of MDD can interfere with relationships and with everyday activities. °Treatments and support are available for people who develop MDD. You may need more than one type of treatment. °Get help right away if you have serious thoughts about hurting yourself or others. °This information is not intended to replace advice given to you by your health care provider. Make sure you discuss any questions you have with your health care provider. °Document Revised: 02/11/2021 Document Reviewed: 06/30/2019 °Elsevier Patient Education © 2022 [employer] ° °

## 2021-10-23 ENCOUNTER — Other Ambulatory Visit: Payer: Self-pay

## 2021-10-23 ENCOUNTER — Ambulatory Visit (HOSPITAL_COMMUNITY)
Admission: RE | Admit: 2021-10-23 | Discharge: 2021-10-23 | Disposition: A | Discharge status: Home / Self Care | Payer: BC Managed Care – PPO | Source: Ambulatory Visit | Attending: Orthopedic Surgery | Admitting: Orthopedic Surgery

## 2021-10-23 DIAGNOSIS — M542 Cervicalgia: Secondary | ICD-10-CM | POA: Diagnosis present

## 2021-10-23 NOTE — Progress Notes (Signed)
[facility] today at [facility] for [facility] cervical spine wo contrast. Patient has Architect. Heart connect with [facility]. Lead appropriate for scan. Therapies off for MRI. Will re-program once scan is completed.  ?

## 2021-11-03 ENCOUNTER — Encounter: Payer: Self-pay | Admitting: Obstetrics

## 2021-11-03 ENCOUNTER — Ambulatory Visit: Payer: Medicaid Other | Admitting: Obstetrics and Gynecology

## 2021-11-03 ENCOUNTER — Telehealth: Payer: Self-pay

## 2021-11-03 NOTE — Telephone Encounter (Signed)
Patient called and left message on triage vm requesting a refill for ibuprofen. Attempted to call patient back to further discuss. No answer, unable to leave vm, vm box is full. ?

## 2021-11-05 ENCOUNTER — Other Ambulatory Visit: Payer: Self-pay | Admitting: Obstetrics

## 2021-11-05 ENCOUNTER — Telehealth: Payer: Self-pay | Admitting: Emergency Medicine

## 2021-11-05 DIAGNOSIS — N946 Dysmenorrhea, unspecified: Secondary | ICD-10-CM

## 2021-11-05 MED ORDER — IBUPROFEN 800 MG PO TABS: 800.0000 mg | ORAL_TABLET | Freq: Three times a day (TID) | ORAL | 5 refills | Status: DC | PRN

## 2021-11-05 NOTE — Telephone Encounter (Signed)
Pt informed that Ibuprofen Rx sent to pharmacy.  ?

## 2021-11-30 ENCOUNTER — Encounter: Payer: Self-pay | Admitting: Obstetrics

## 2021-12-01 ENCOUNTER — Other Ambulatory Visit: Payer: Self-pay | Admitting: Obstetrics

## 2021-12-01 DIAGNOSIS — B3731 Acute candidiasis of vulva and vagina: Secondary | ICD-10-CM

## 2021-12-01 MED ORDER — TERCONAZOLE 0.8 % VA CREA: 1.0000 | TOPICAL_CREAM | Freq: Every day | VAGINAL | 0 refills | Status: DC

## 2021-12-14 ENCOUNTER — Ambulatory Visit (INDEPENDENT_AMBULATORY_CARE_PROVIDER_SITE_OTHER): Payer: BC Managed Care – PPO

## 2021-12-14 DIAGNOSIS — I4729 Other ventricular tachycardia: Secondary | ICD-10-CM | POA: Diagnosis not present

## 2021-12-15 LAB — CUP PACEART REMOTE DEVICE CHECK
Battery Remaining Percentage: 89 %
Date Time Interrogation Session: 20230516100300
Implantable Lead Implant Date: 20150521
Implantable Lead Location: 753862
Implantable Lead Model: 3400
Implantable Pulse Generator Implant Date: 20220401
Pulse Gen Serial Number: 157706

## 2021-12-18 ENCOUNTER — Ambulatory Visit: Payer: Medicaid Other | Admitting: Obstetrics

## 2022-01-01 NOTE — Progress Notes (Signed)
Remote ICD transmission.   

## 2022-01-21 ENCOUNTER — Encounter: Payer: Self-pay | Admitting: Internal Medicine

## 2022-01-26 ENCOUNTER — Ambulatory Visit: Payer: BC Managed Care – PPO | Admitting: Internal Medicine

## 2022-03-29 ENCOUNTER — Other Ambulatory Visit: Payer: Self-pay | Admitting: Internal Medicine

## 2022-03-29 DIAGNOSIS — G43709 Chronic migraine without aura, not intractable, without status migrainosus: Secondary | ICD-10-CM

## 2022-03-29 MED ORDER — UBRELVY 50 MG PO TABS: 1.0000 | ORAL_TABLET | ORAL | 0 refills | Status: DC | PRN

## 2022-03-30 ENCOUNTER — Ambulatory Visit (INDEPENDENT_AMBULATORY_CARE_PROVIDER_SITE_OTHER): Payer: BC Managed Care – PPO

## 2022-03-30 DIAGNOSIS — I4729 Other ventricular tachycardia: Secondary | ICD-10-CM | POA: Diagnosis not present

## 2022-03-30 LAB — CUP PACEART REMOTE DEVICE CHECK
Battery Remaining Percentage: 86 %
Date Time Interrogation Session: 20230826063700
Implantable Lead Implant Date: 20150521
Implantable Lead Location: 753862
Implantable Lead Model: 3400
Implantable Pulse Generator Implant Date: 20220401
Pulse Gen Serial Number: 157706

## 2022-04-23 NOTE — Progress Notes (Signed)
Remote ICD transmission.   

## 2022-05-20 ENCOUNTER — Ambulatory Visit (INDEPENDENT_AMBULATORY_CARE_PROVIDER_SITE_OTHER): Payer: BC Managed Care – PPO | Admitting: Obstetrics and Gynecology

## 2022-05-20 ENCOUNTER — Encounter: Payer: Self-pay | Admitting: Obstetrics and Gynecology

## 2022-05-20 ENCOUNTER — Other Ambulatory Visit (HOSPITAL_COMMUNITY)
Admission: RE | Admit: 2022-05-20 | Discharge: 2022-05-20 | Disposition: A | Discharge status: Home / Self Care | Payer: BC Managed Care – PPO | Source: Ambulatory Visit | Attending: Obstetrics and Gynecology | Admitting: Obstetrics and Gynecology

## 2022-05-20 VITALS — BP 120/79 | HR 80 | Ht 64.0 in | Wt 129.0 lb

## 2022-05-20 DIAGNOSIS — Z9889 Other specified postprocedural states: Secondary | ICD-10-CM

## 2022-05-20 DIAGNOSIS — Z01419 Encounter for gynecological examination (general) (routine) without abnormal findings: Secondary | ICD-10-CM | POA: Diagnosis present

## 2022-05-20 DIAGNOSIS — Z3009 Encounter for other general counseling and advice on contraception: Secondary | ICD-10-CM | POA: Diagnosis not present

## 2022-05-20 NOTE — Progress Notes (Signed)
Pt is in the office for annual Last pap 10/14/2020 LMP 05/05/22  Declines BC

## 2022-05-20 NOTE — Progress Notes (Signed)
ANNUAL EXAM Patient name: Courtney Rice MRN [id number]  Date of birth: Mar 10, 1991 Chief Complaint:   Annual Exam  History of Present Illness:   Courtney Rice is a 31 y.o. G1P1001 being seen today for a routine annual exam.  Current complaints: GI changes  Patient's last menstrual period was 05/05/2022.  Previously had menstrual complaints, well controlled with ibuprofen.   Not currently sexually active, using condoms when active. Would like STI testing today. Not interested in hormonal BC options due to complex cardiac history.   Reports increasing episodes of nausea and abdominal discomfort by foods previously tolerated. No vomiting.   The pregnancy intention screening data noted above was reviewed. Potential methods of contraception were discussed. The patient elected to proceed with condoms.   Last pap 09/2020. Results were: NILM w/ HRHPV not done. H/O abnormal pap: yes S/P LEEP, CIN II, 2021     10/08/2021    3:31 PM  Depression screen PHQ 2/9  Decreased Interest 0  Down, Depressed, Hopeless 0  PHQ - 2 Score 0         No data to display           Review of Systems:   Pertinent items are noted in HPI Denies any headaches, blurred vision, fatigue, shortness of breath, chest pain, abdominal pain, abnormal vaginal discharge/itching/odor/irritation, problems with periods, bowel movements, urination, or intercourse unless otherwise stated above. Pertinent History Reviewed:  Reviewed past medical,surgical, social and family history.  Reviewed problem list, medications and allergies. Physical Assessment:   Vitals:   05/20/22 0828  BP: 120/79  Pulse: 80  Weight: 129 lb (58.5 kg)  Height: 5\' 4"  (1.626 m)  Body mass index is 22.14 kg/m.        Physical Examination:   General appearance - well appearing, and in no distress  Mental status - alert, oriented to person, place, and time  Psych:  She has a normal mood and affect  Skin - warm and dry, normal color,  no suspicious lesions noted  Chest - effort normal, all lung fields clear to auscultation bilaterally, implanted device palpated in left chest wall, lateral to breast  Heart - normal rate and regular rhythm  Breasts - breasts appear normal, no suspicious masses, no skin or nipple changes or  axillary nodes  Abdomen - soft, nontender, nondistended, no masses or organomegaly  Pelvic - VULVA: normal appearing vulva with no masses, tenderness or lesions  VAGINA: normal appearing vagina with normal color and discharge, no lesions  CERVIX: normal appearing cervix without discharge or lesions, no CMT  Thin prep pap is done with reflex HR HPV cotesting  Extremities:  No swelling or varicosities noted  Chaperone present for exam  No results found for this or any previous visit (from the past 24 hour(s)).  Assessment & Plan:  1. Well woman exam with routine gynecological exam Routine screening completed today  Discussed course of PPI/H2 blocker for possible GER Discussed food journal to identify trigger and either elimination/add back diet to identify triggers - Cervicovaginal ancillary only( [place]) - HIV antibody (with reflex) - RPR - Hepatitis B Surface AntiGEN - Hepatitis C Antibody - Cytology - PAP( [place])  2. History of loop electrosurgical excision procedure (LEEP) Pap collected per ASCCP guidelines   Orders Placed This Encounter  Procedures   HIV antibody (with reflex)   RPR   Hepatitis B Surface AntiGEN   Hepatitis C Antibody    Meds: No orders of the  defined types were placed in this encounter.   Follow-up: Return for Annual GYN.  Darliss Cheney, MD 05/20/2022 8:47 AM

## 2022-05-20 NOTE — Patient Instructions (Signed)
Consider antacid course, elimination diet or add back diet to identify foods that trigger GI upset and nausea

## 2022-05-21 LAB — CERVICOVAGINAL ANCILLARY ONLY
Bacterial Vaginitis (gardnerella): NEGATIVE
Candida Glabrata: NEGATIVE
Candida Vaginitis: NEGATIVE
Chlamydia: NEGATIVE
Comment: NEGATIVE
Comment: NEGATIVE
Comment: NEGATIVE
Comment: NEGATIVE
Comment: NEGATIVE
Comment: NORMAL
Neisseria Gonorrhea: NEGATIVE
Trichomonas: NEGATIVE

## 2022-05-21 LAB — HEPATITIS B SURFACE ANTIGEN: Hepatitis B Surface Ag: NEGATIVE

## 2022-05-21 LAB — RPR: RPR Ser Ql: NONREACTIVE

## 2022-05-21 LAB — HEPATITIS C ANTIBODY: Hep C Virus Ab: NONREACTIVE

## 2022-05-21 LAB — HIV ANTIBODY (ROUTINE TESTING W REFLEX): HIV Screen 4th Generation wRfx: NONREACTIVE

## 2022-05-27 LAB — CYTOLOGY - PAP
Comment: NEGATIVE
Diagnosis: NEGATIVE
High risk HPV: NEGATIVE

## 2022-08-18 ENCOUNTER — Ambulatory Visit (INDEPENDENT_AMBULATORY_CARE_PROVIDER_SITE_OTHER): Payer: BC Managed Care – PPO | Admitting: Internal Medicine

## 2022-08-18 ENCOUNTER — Encounter: Payer: Self-pay | Admitting: Internal Medicine

## 2022-08-18 VITALS — BP 116/74 | HR 61 | Temp 98.1°F | Resp 16 | Ht 64.0 in | Wt 129.0 lb

## 2022-08-18 DIAGNOSIS — G43709 Chronic migraine without aura, not intractable, without status migrainosus: Secondary | ICD-10-CM | POA: Diagnosis not present

## 2022-08-18 DIAGNOSIS — M7652 Patellar tendinitis, left knee: Secondary | ICD-10-CM | POA: Diagnosis not present

## 2022-08-18 DIAGNOSIS — M7651 Patellar tendinitis, right knee: Secondary | ICD-10-CM

## 2022-08-18 DIAGNOSIS — R1084 Generalized abdominal pain: Secondary | ICD-10-CM | POA: Diagnosis not present

## 2022-08-18 LAB — CBC WITH DIFFERENTIAL/PLATELET
Basophils Absolute: 0 10*3/uL (ref 0.0–0.1)
Basophils Relative: 0.8 % (ref 0.0–3.0)
Eosinophils Absolute: 0.1 10*3/uL (ref 0.0–0.7)
Eosinophils Relative: 1.4 % (ref 0.0–5.0)
HCT: 40.3 % (ref 36.0–46.0)
Hemoglobin: 14.1 g/dL (ref 12.0–15.0)
Lymphocytes Relative: 63.1 % — ABNORMAL HIGH (ref 12.0–46.0)
Lymphs Abs: 2.8 10*3/uL (ref 0.7–4.0)
MCHC: 35 g/dL (ref 30.0–36.0)
MCV: 90.6 fl (ref 78.0–100.0)
Monocytes Absolute: 0.5 10*3/uL (ref 0.1–1.0)
Monocytes Relative: 12 % (ref 3.0–12.0)
Neutro Abs: 1.1 10*3/uL — ABNORMAL LOW (ref 1.4–7.7)
Neutrophils Relative %: 23.9 % — ABNORMAL LOW (ref 43.0–77.0)
Platelets: 215 10*3/uL (ref 150.0–400.0)
RBC: 4.45 Mil/uL (ref 3.87–5.11)
RDW: 12.6 % (ref 11.5–15.5)
WBC: 4.5 10*3/uL (ref 4.0–10.5)

## 2022-08-18 LAB — HCG, QUANTITATIVE, PREGNANCY: Quantitative HCG: 0.64 m[IU]/mL

## 2022-08-18 LAB — HEPATIC FUNCTION PANEL
ALT: 16 U/L (ref 0–35)
AST: 20 U/L (ref 0–37)
Albumin: 4.5 g/dL (ref 3.5–5.2)
Alkaline Phosphatase: 46 U/L (ref 39–117)
Bilirubin, Direct: 0.1 mg/dL (ref 0.0–0.3)
Total Bilirubin: 0.4 mg/dL (ref 0.2–1.2)
Total Protein: 7.4 g/dL (ref 6.0–8.3)

## 2022-08-18 LAB — URINALYSIS, ROUTINE W REFLEX MICROSCOPIC
Bilirubin Urine: NEGATIVE
Hgb urine dipstick: NEGATIVE
Ketones, ur: NEGATIVE
Leukocytes,Ua: NEGATIVE
Nitrite: NEGATIVE
RBC / HPF: NONE SEEN (ref 0–?)
Specific Gravity, Urine: <=1.005 — AB (ref 1.000–1.030)
Total Protein, Urine: NEGATIVE
Urine Glucose: NEGATIVE
Urobilinogen, UA: 0.2 (ref 0.0–1.0)
WBC, UA: NONE SEEN (ref 0–?)
pH: 6.5 (ref 5.0–8.0)

## 2022-08-18 LAB — BASIC METABOLIC PANEL
BUN: 7 mg/dL (ref 6–23)
CO2: 30 mEq/L (ref 19–32)
Calcium: 9.5 mg/dL (ref 8.4–10.5)
Chloride: 104 mEq/L (ref 96–112)
Creatinine, Ser: 0.69 mg/dL (ref 0.40–1.20)
GFR: 116.22 mL/min (ref >60.00)
Glucose, Bld: 75 mg/dL (ref 70–99)
Potassium: 4 mEq/L (ref 3.5–5.1)
Sodium: 138 mEq/L (ref 135–145)

## 2022-08-18 LAB — TSH: TSH: 1.21 u[IU]/mL (ref 0.35–5.50)

## 2022-08-18 LAB — LIPASE: Lipase: 29 U/L (ref 11.0–59.0)

## 2022-08-18 LAB — TRIGLYCERIDES: Triglycerides: 52 mg/dL (ref 0.0–149.0)

## 2022-08-18 LAB — C-REACTIVE PROTEIN: CRP: <1 mg/dL (ref 0.5–20.0)

## 2022-08-18 MED ORDER — UBRELVY 50 MG PO TABS: 1.0000 | ORAL_TABLET | ORAL | 2 refills | Status: DC | PRN

## 2022-08-18 NOTE — Progress Notes (Signed)
Subjective:  Patient ID: Courtney Rice, female    DOB: [birth year]  Age: 32 y.o. MRN: [id number]  CC: Abdominal Pain and Headache   HPI Courtney Rice presents for f/up -  She has up to 4 migraines a week.  She has not recently had anything to take for the migraines. The headaches have not changed over the last few years.  She also complains of a several year history of pain and swelling in both knees.  She denies trauma or injury.    Additionally she complains of abdominal discomfort for greater than a year.  She has intermittent nausea, cramps, diarrhea, and constipation.  She denies loss of appetite, weight loss, bright red blood per rectum, or melena.  Outpatient Medications Prior to Visit  Medication Sig Dispense Refill   DULoxetine (CYMBALTA) 30 MG capsule Take 1 capsule (30 mg total) by mouth daily. 30 capsule 0   ibuprofen (ADVIL) 800 MG tablet Take 1 tablet (800 mg total) by mouth every 8 (eight) hours as needed. 30 tablet 5   traZODone (DESYREL) 50 MG tablet Take 1 tablet (50 mg total) by mouth at bedtime. 90 tablet 0   Ubrogepant (UBRELVY) 50 MG TABS Take 1 tablet by mouth as needed. 6 tablet 0   terconazole (TERAZOL 3) 0.8 % vaginal cream Place 1 applicator vaginally at bedtime. 20 g 0   No facility-administered medications prior to visit.    ROS Review of Systems  Constitutional:  Negative for appetite change, chills, diaphoresis, fatigue and fever.  HENT: Negative.  Negative for trouble swallowing.   Eyes: Negative.   Respiratory:  Negative for cough, chest tightness, shortness of breath and wheezing.   Cardiovascular:  Negative for chest pain, palpitations and leg swelling.  Gastrointestinal:  Positive for abdominal pain, constipation, diarrhea and nausea. Negative for blood in stool and vomiting.  Genitourinary: Negative.  Negative for difficulty urinating.  Musculoskeletal:  Positive for arthralgias. Negative for back pain, myalgias and neck pain.  Skin:  Negative.   Neurological:  Positive for headaches. Negative for weakness and light-headedness.  Hematological:  Negative for adenopathy. Does not bruise/bleed easily.  Psychiatric/Behavioral: Negative.      Objective:  BP 116/74 (BP Location: Left Arm, Patient Position: Sitting, Cuff Size: Large)   Pulse 61   Temp 98.1 F (36.7 C) (Oral)   Resp 16   Ht 5\' 4"  (1.626 m)   Wt 129 lb (58.5 kg)   SpO2 98%   BMI 22.14 kg/m   BP Readings from Last 3 Encounters:  08/18/22 116/74  05/20/22 120/79  10/08/21 118/80    Wt Readings from Last 3 Encounters:  08/18/22 129 lb (58.5 kg)  05/20/22 129 lb (58.5 kg)  10/08/21 126 lb (57.2 kg)    Physical Exam Vitals reviewed.  Constitutional:      Appearance: She is not ill-appearing.  HENT:     Mouth/Throat:     Mouth: Mucous membranes are moist.  Eyes:     General: No scleral icterus.    Conjunctiva/sclera: Conjunctivae normal.  Cardiovascular:     Rate and Rhythm: Normal rate and regular rhythm.     Heart sounds: No murmur heard. Pulmonary:     Effort: Pulmonary effort is normal.     Breath sounds: No stridor. No wheezing, rhonchi or rales.  Abdominal:     General: Abdomen is flat. Bowel sounds are normal. There is no distension.     Palpations: Abdomen is soft. There is no fluid wave,  hepatomegaly, splenomegaly or mass.     Tenderness: There is no abdominal tenderness. There is no guarding.  Musculoskeletal:        General: Tenderness present. No deformity. Normal range of motion.     Cervical back: Neck supple.     Right lower leg: No edema.     Comments: There is tenderness around bilateral patella but no swelling or effusion.  Lymphadenopathy:     Cervical: No cervical adenopathy.  Skin:    General: Skin is warm and dry.  Neurological:     General: No focal deficit present.     Mental Status: She is alert.  Psychiatric:        Mood and Affect: Mood normal.        Behavior: Behavior normal.     Lab Results   Component Value Date   WBC 4.5 08/18/2022   HGB 14.1 08/18/2022   HCT 40.3 08/18/2022   PLT 215.0 08/18/2022   GLUCOSE 75 08/18/2022   TRIG 52.0 08/18/2022   ALT 16 08/18/2022   AST 20 08/18/2022   NA 138 08/18/2022   K 4.0 08/18/2022   CL 104 08/18/2022   CREATININE 0.69 08/18/2022   BUN 7 08/18/2022   CO2 30 08/18/2022   TSH 1.21 08/18/2022   HGBA1C 5.4 09/22/2021    No results found.  Assessment & Plan:   Courtney Rice was seen today for abdominal pain and headache.  Diagnoses and all orders for this visit:  Patellar tendonitis of both knees -     Ambulatory referral to Physical Therapy  Chronic migraine without aura without status migrainosus, not intractable- Will prescribe CGRP antagonists for prevention of migraine and treatment of migraine. -     Ubrogepant (UBRELVY) 50 MG TABS; Take 1 tablet by mouth as needed. -     Atogepant (QULIPTA) 60 MG TABS; Take 1 tablet (60 mg total) by mouth daily.  Generalized abdominal pain- Her exam and labs are reassuring. -     Lipase; Future -     Triglycerides; Future -     TSH; Future -     Urinalysis, Routine w reflex microscopic; Future -     Hepatic function panel; Future -     CBC with Differential/Platelet; Future -     Basic metabolic panel; Future -     C-reactive protein; Future -     hCG, quantitative, pregnancy; Future -     hCG, quantitative, pregnancy -     C-reactive protein -     Basic metabolic panel -     CBC with Differential/Platelet -     Hepatic function panel -     Urinalysis, Routine w reflex microscopic -     TSH -     Triglycerides -     Lipase   I have discontinued Courtney Rice's terconazole. I am also having her start on Qulipta. Additionally, I am having her maintain her DULoxetine, traZODone, ibuprofen, and Ubrelvy.  Meds ordered this encounter  Medications   Ubrogepant (UBRELVY) 50 MG TABS    Sig: Take 1 tablet by mouth as needed.    Dispense:  6 tablet    Refill:  2    Atogepant (QULIPTA) 60 MG TABS    Sig: Take 1 tablet (60 mg total) by mouth daily.    Dispense:  90 tablet    Refill:  1     Follow-up: Return in about 3 months (around 11/17/2022).  Courtney Calico, MD

## 2022-08-18 NOTE — Patient Instructions (Signed)

## 2022-08-19 ENCOUNTER — Encounter: Payer: Self-pay | Admitting: Internal Medicine

## 2022-08-19 MED ORDER — QULIPTA 60 MG PO TABS: 1.0000 | ORAL_TABLET | Freq: Every day | ORAL | 1 refills | Status: DC

## 2022-08-20 ENCOUNTER — Telehealth: Payer: Self-pay

## 2022-08-20 NOTE — Therapy (Signed)
OUTPATIENT PHYSICAL THERAPY LOWER EXTREMITY EVALUATION   Patient Name: Courtney Rice MRN: [id number] DOB:September 08, 1990, 32 y.o., female Today's Date: 08/23/2022  END OF SESSION:  PT End of Session - 08/23/22 1109     Visit Number 1    Number of Visits 7    Date for PT Re-Evaluation 10/09/22    Authorization Type BCBS/ [employer] MCD    PT Start Time 1109   patient late   PT Stop Time 1146    PT Time Calculation (min) 37 min    Activity Tolerance Patient tolerated treatment well    Behavior During Therapy WFL for tasks assessed/performed             Past Medical History:  Diagnosis Date   AICD (automatic cardioverter/defibrillator) present    Long Q-T syndrome Type 2    a. post partum syncope and VT PM;  b. 11/2013 s/p BSX SubQ ICD placement.   Migraine    Pre-syncope admitted 08/01/2015   a. felt to be 2/2 orthostatic hypotension - propranolol decreased.    Past Surgical History:  Procedure Laterality Date   IMPLANTABLE CARDIOVERTER DEFIBRILLATOR IMPLANT  12-20-2013   BSX SubQ ICD implanted by Dr Graciela Husbands   IMPLANTABLE CARDIOVERTER DEFIBRILLATOR IMPLANT N/A 12/20/2013   Procedure: SUB Q ICD;  Surgeon: Duke Salvia, MD;  Location: [facility] CATH LAB;  Service: Cardiovascular;  Laterality: N/A;   SUBQ ICD CHANGEOUT N/A 10/31/2020   Procedure: SUBQ ICD CHANGEOUT;  Surgeon: Duke Salvia, MD;  Location: [facility] INVASIVE CV LAB;  Service: Cardiovascular;  Laterality: N/A;   TONSILLECTOMY AND ADENOIDECTOMY  2011   Patient Active Problem List   Diagnosis Date Noted   Patellar tendonitis of both knees 08/18/2022   Generalized abdominal pain 08/18/2022   Chronic migraine without aura without status migrainosus, not intractable 10/08/2021   Current moderate episode of major depressive disorder without prior episode (HCC) 10/08/2021   ICD (implantable cardioverter-defibrillator) in place 02/16/2021   HGSIL (high grade squamous intraepithelial lesion) on Pap smear of cervix 09/06/2019   LGSIL on  Pap smear of cervix 07/12/2018   Dizziness 08/01/2015   Episode of altered consciousness 01/23/2014   Polymorphic ventricular tachycardia (HCC) 11/30/2013   Syncope 06/06/2013   Long Q-T syndrome 06/06/2013    PCP: Etta Grandchild, MD   REFERRING PROVIDER: Etta Grandchild, MD   REFERRING DIAG: [phone] (ICD-10-CM) - Patellar tendonitis of both knees   THERAPY DIAG:  Chronic pain of left knee  Chronic pain of right knee  Difficulty in walking, not elsewhere classified  Muscle weakness (generalized)  Rationale for Evaluation and Treatment: Rehabilitation  ONSET DATE: chronic   SUBJECTIVE:   SUBJECTIVE STATEMENT: Patient reports her knee pain began about 3 years ago and was told by her doctor to take Vitamin D, but this didn't help. She does not recall any injury, but noted hearing popping in her knees when she would straighten them out after sitting for awhile when her pain first began. She feels the pain has worsened over the past year. She denies any numbness/tingling. She denies any giving away. She reports daily popping in her knees that can occur randomly and this is sometimes painful. She denies any previous injury to the knees. She works from home and thought working out would help her pain, but it has not. She reports if she walks for more than a mile she gets severe pain and has to stop walking. She reports the pain is worse when she first wakes  up in the morning.   PERTINENT HISTORY: Long QT syndrome; AICD  PAIN:  Are you having pain? None currently Yes: NPRS scale: 10 at worst/10 Pain location: bilateral anterior knee (superior and medial of the patella) Pain description: burning Aggravating factors: prolonged walking, steps Relieving factors: medication  PRECAUTIONS: ICD/Pacemaker; per patient no running/vigorous exercise due to heart condition  WEIGHT BEARING RESTRICTIONS: No  FALLS:  Has patient fallen in last 6 months? No  LIVING ENVIRONMENT: Lives  with: lives alone Lives in: House/apartment Stairs: No Has following equipment at home: None  OCCUPATION: accounting/logistics (desk job)  PLOF: Independent  PATIENT GOALS: "for my knees not to hurt and do regular stuff."   OBJECTIVE:   DIAGNOSTIC FINDINGS: none   PATIENT SURVEYS:  LEFS 41/68 (did not score running and hopping activity due to heart condition)  COGNITION: Overall cognitive status: Within functional limits for tasks assessed     SENSATION: Not tested  EDEMA:  No obvious swelling about bilateral knees   MUSCLE LENGTH: Hamstrings: WNL bilaterally   POSTURE: squinting patella   PALPATION: TTP distal quadriceps, medial and superior patella, medial patellofemoral ligament   LOWER EXTREMITY ROM:  Active ROM Right eval Left eval  Hip flexion    Hip extension    Hip abduction    Hip adduction    Hip internal rotation    Hip external rotation    Knee flexion WNL WNL  Knee extension WNL WNL  Ankle dorsiflexion 9 5  Ankle plantarflexion    Ankle inversion    Ankle eversion     (Blank rows = not tested)  LOWER EXTREMITY MMT:  MMT Right eval Left eval  Hip flexion 5 5  Hip extension 4+ 4+  Hip abduction 4 4  Hip adduction    Hip internal rotation    Hip external rotation    Knee flexion 5 5  Knee extension 5 5  Ankle dorsiflexion    Ankle plantarflexion    Ankle inversion    Ankle eversion     (Blank rows = not tested)  LOWER EXTREMITY SPECIAL TESTS:  Ober's (+) Thomas (+)  Valgus, Varus, McMurray's (-)   FUNCTIONAL TESTS:  Squat: excessive foot ER, varus, limited depth, WBOS; increased pain   GAIT: Not formally assessed   [facility] Adult PT Treatment:                                                DATE: 08/23/22 Therapeutic Exercise: Demonstrated and issue initial HEP.    Therapeutic Activity: Education on assessment findings that will be addressed throughout duration of POC.      PATIENT EDUCATION:  Education details: see  treatment  Person educated: Patient Education method: Explanation, Demonstration, Tactile cues, Verbal cues, and Handouts Education comprehension: verbalized understanding, returned demonstration, verbal cues required, tactile cues required, and needs further education  HOME EXERCISE PROGRAM: Access Code: [email or web address] URL: [email or web address] Date: 08/23/2022 Prepared by: Gwendolyn Grant  Exercises - Gastroc Stretch on Wall  - 1 x daily - 7 x weekly - 3 sets - 30 sec  hold - Modified Thomas Stretch  - 1 x daily - 7 x weekly - 3 sets - 30 sec hold - Supine ITB Stretch with Strap  - 1 x daily - 7 x weekly - 3 sets - 30 sec  hold  ASSESSMENT:  CLINICAL IMPRESSION:  Patient is a 32 y.o. female who was seen today for physical therapy evaluation and treatment for chronic bilateral knee pain of insidious onset. Her signs and symptoms appear consistent with patellofemoral pain syndrome with patient noted to have bilateral squinting patella, tight hip flexors, gastrocs, and IT band bilaterally, mild gluteal weakness, and aberrant/painful squatting mechanics. She will benefit from skilled PT to address the above stated deficits in order to optimize her function and assist in overall pain reduction.    OBJECTIVE IMPAIRMENTS: decreased activity tolerance, decreased knowledge of condition, difficulty walking, decreased ROM, decreased strength, increased fascial restrictions, impaired flexibility, postural dysfunction, and pain.   ACTIVITY LIMITATIONS: carrying, lifting, bending, standing, squatting, stairs, and locomotion level  PARTICIPATION LIMITATIONS: shopping, community activity, and recreation  PERSONAL FACTORS: Profession, Time since onset of injury/illness/exacerbation, and 1 comorbidity: long QT syndrome  are also affecting patient's functional outcome.   REHAB POTENTIAL: Good  CLINICAL DECISION MAKING: Evolving/moderate complexity  EVALUATION COMPLEXITY:  Moderate   GOALS: Goals reviewed with patient? Yes  SHORT TERM GOALS: Target date: 09/13/2022   Patient will be independent and compliant with initial HEP.   Baseline: issued at eval  Goal status: INITIAL  2.  Patient will demonstrate at least 10 degrees of bilateral ankle DF AROM to reduce stress on knees with closed chain activity.  Baseline: see above  Goal status: INITIAL   LONG TERM GOALS: Target date: 10/04/2022    Patient will demonstrate 5/5 bilateral hip strength to improve stability about the chain with prolonged walking/standing activity.  Baseline: see above  Goal status: INITIAL  2.  Patient will demonstrate pain free normalized squat mechanics.  Baseline: see above  Goal status: INITIAL  3.  Patient will score at least 50 on the LEFS to signify clinically meaningful improvement in functional abilities.  Baseline: see above  Goal status: INITIAL  4.  Patient will report pain at worst rated as 5/10 to reduce her current functional limitations.  Baseline: see above  Goal status: INITIAL  5.  Patient will be independent with advanced home program to assist in management of her chronic condition.  Baseline: initial HEP issued  Goal status: INITIAL    PLAN:  PT FREQUENCY: 1x/week  PT DURATION: 6 weeks  PLANNED INTERVENTIONS: Therapeutic exercises, Therapeutic activity, Neuromuscular re-education, Balance training, Gait training, Patient/Family education, Self Care, Joint mobilization, Dry Needling, Cryotherapy, Moist heat, Manual therapy, and Re-evaluation  PLAN FOR NEXT SESSION: review and progress HEP, glute strengthening, assess gait mechanics, closed chain strengthening.    Gwendolyn Grant, PT, DPT, ATC 08/23/22 1:49 PM

## 2022-08-20 NOTE — Telephone Encounter (Signed)
Approved Effective from 08/20/2022 through 11/11/2022.

## 2022-08-20 NOTE — Telephone Encounter (Signed)
Key: [id number]

## 2022-08-23 ENCOUNTER — Ambulatory Visit: Payer: BC Managed Care – PPO | Attending: Internal Medicine

## 2022-08-23 ENCOUNTER — Other Ambulatory Visit: Payer: Self-pay

## 2022-08-23 DIAGNOSIS — R262 Difficulty in walking, not elsewhere classified: Secondary | ICD-10-CM | POA: Insufficient documentation

## 2022-08-23 DIAGNOSIS — G8929 Other chronic pain: Secondary | ICD-10-CM | POA: Insufficient documentation

## 2022-08-23 DIAGNOSIS — M7652 Patellar tendinitis, left knee: Secondary | ICD-10-CM | POA: Insufficient documentation

## 2022-08-23 DIAGNOSIS — M25562 Pain in left knee: Secondary | ICD-10-CM | POA: Insufficient documentation

## 2022-08-23 DIAGNOSIS — M6281 Muscle weakness (generalized): Secondary | ICD-10-CM | POA: Diagnosis present

## 2022-08-23 DIAGNOSIS — M7651 Patellar tendinitis, right knee: Secondary | ICD-10-CM | POA: Diagnosis not present

## 2022-08-23 DIAGNOSIS — M25561 Pain in right knee: Secondary | ICD-10-CM | POA: Insufficient documentation

## 2022-08-25 ENCOUNTER — Telehealth: Payer: Self-pay

## 2022-08-25 NOTE — Telephone Encounter (Signed)
Key: [id number]

## 2022-08-27 ENCOUNTER — Other Ambulatory Visit: Payer: Self-pay | Admitting: Internal Medicine

## 2022-08-27 ENCOUNTER — Telehealth: Payer: Self-pay | Admitting: Internal Medicine

## 2022-08-27 DIAGNOSIS — R1084 Generalized abdominal pain: Secondary | ICD-10-CM

## 2022-08-27 NOTE — Telephone Encounter (Signed)
Patient would like a referral to a gastroenterologist - she discussed her stomach issues with Dr. Ronnald Ramp at her last visit but nothing was prescribed.  She is still experiencing stomach discomfort and bloating.  Please advise patient when referreral is made.  Patient's phone #  [phone]

## 2022-08-30 ENCOUNTER — Encounter: Payer: Self-pay | Admitting: Gastroenterology

## 2022-09-01 ENCOUNTER — Ambulatory Visit: Payer: BC Managed Care – PPO

## 2022-09-01 DIAGNOSIS — R262 Difficulty in walking, not elsewhere classified: Secondary | ICD-10-CM

## 2022-09-01 DIAGNOSIS — M6281 Muscle weakness (generalized): Secondary | ICD-10-CM

## 2022-09-01 DIAGNOSIS — G8929 Other chronic pain: Secondary | ICD-10-CM

## 2022-09-01 DIAGNOSIS — M25562 Pain in left knee: Secondary | ICD-10-CM | POA: Diagnosis not present

## 2022-09-01 NOTE — Therapy (Signed)
OUTPATIENT PHYSICAL THERAPY TREATMENT NOTE   Patient Name: Courtney Rice MRN: [id number] DOB:[birth year], 32 y.o., female Today's Date: 09/01/2022  PCP: Janith Lima, MD REFERRING PROVIDER: Janith Lima, MD  END OF SESSION:   PT End of Session - 09/01/22 1146     Visit Number 2    Number of Visits 7    Date for PT Re-Evaluation 10/09/22    Authorization Type BCBS/ UHC MCD    PT Start Time 1146    PT Stop Time 1230    PT Time Calculation (min) 44 min    Activity Tolerance Patient tolerated treatment well    Behavior During Therapy WFL for tasks assessed/performed             Past Medical History:  Diagnosis Date   AICD (automatic cardioverter/defibrillator) present    Long Q-T syndrome Type 2    a. post partum syncope and VT PM;  b. 11/2013 s/p BSX SubQ ICD placement.   Migraine    Pre-syncope admitted 08/01/2015   a. felt to be 2/2 orthostatic hypotension - propranolol decreased.    Past Surgical History:  Procedure Laterality Date   IMPLANTABLE CARDIOVERTER DEFIBRILLATOR IMPLANT  12-20-2013   BSX SubQ ICD implanted by Dr Caryl Comes   IMPLANTABLE CARDIOVERTER DEFIBRILLATOR IMPLANT N/A 12/20/2013   Procedure: SUB Q ICD;  Surgeon: Deboraha Sprang, MD;  Location: [facility] CATH LAB;  Service: Cardiovascular;  Laterality: N/A;   SUBQ ICD CHANGEOUT N/A 10/31/2020   Procedure: SUBQ ICD CHANGEOUT;  Surgeon: Deboraha Sprang, MD;  Location: [place] CV LAB;  Service: Cardiovascular;  Laterality: N/A;   TONSILLECTOMY AND ADENOIDECTOMY  2011   Patient Active Problem List   Diagnosis Date Noted   Patellar tendonitis of both knees 08/18/2022   Generalized abdominal pain 08/18/2022   Chronic migraine without aura without status migrainosus, not intractable 10/08/2021   Current moderate episode of major depressive disorder without prior episode ([place]) 10/08/2021   ICD (implantable cardioverter-defibrillator) in place 02/16/2021   HGSIL (high grade squamous intraepithelial lesion) on  Pap smear of cervix 09/06/2019   LGSIL on Pap smear of cervix 07/12/2018   Polymorphic ventricular tachycardia ([place]) 11/30/2013   Long Q-T syndrome 06/06/2013    REFERRING DIAG:  M76.51,M76.52 (ICD-10-CM) - Patellar tendonitis of both knees    THERAPY DIAG:  Chronic pain of left knee  Chronic pain of right knee  Difficulty in walking, not elsewhere classified  Muscle weakness (generalized)  Rationale for Evaluation and Treatment Rehabilitation  PERTINENT HISTORY: Long QT syndrome; AICD  PRECAUTIONS: ICD/Pacemaker; per patient no running/vigorous exercise due to heart condition   SUBJECTIVE:  SUBJECTIVE STATEMENT:  Patient reports her knees are ok right now, but were hurting all last week. She reports completing HEP a few times.   PAIN:  Are you having pain? None currently Yes: NPRS scale: 10 at worst/10 Pain location: bilateral anterior knee (superior and medial of the patella) Pain description: dull burning Aggravating factors: prolonged walking, steps Relieving factors: medication   OBJECTIVE: (objective measures completed at initial evaluation unless otherwise dated)  DIAGNOSTIC FINDINGS: none    PATIENT SURVEYS:  LEFS 41/68 (did not score running and hopping activity due to heart condition)   COGNITION: Overall cognitive status: Within functional limits for tasks assessed                         SENSATION: Not tested   EDEMA:  No obvious swelling about bilateral knees    MUSCLE LENGTH: Hamstrings: WNL bilaterally    POSTURE: squinting patella    PALPATION: TTP distal quadriceps, medial and superior patella, medial patellofemoral ligament    LOWER EXTREMITY ROM:   Active ROM Right eval Left eval  Hip flexion      Hip extension      Hip abduction      Hip adduction       Hip internal rotation      Hip external rotation      Knee flexion WNL WNL  Knee extension WNL WNL  Ankle dorsiflexion 9 5  Ankle plantarflexion      Ankle inversion      Ankle eversion       (Blank rows = not tested)   LOWER EXTREMITY MMT:   MMT Right eval Left eval  Hip flexion 5 5  Hip extension 4+ 4+  Hip abduction 4 4  Hip adduction      Hip internal rotation      Hip external rotation      Knee flexion 5 5  Knee extension 5 5  Ankle dorsiflexion      Ankle plantarflexion      Ankle inversion      Ankle eversion       (Blank rows = not tested)   LOWER EXTREMITY SPECIAL TESTS:  Ober's (+) Thomas (+)  Valgus, Varus, McMurray's (-)    FUNCTIONAL TESTS:  Squat: excessive foot ER, varus, limited depth, WBOS; increased pain  09/01/22: leg length ASIS to medial malleolus: Lt 91.5 cm, Rt 93.5 cm   GAIT: Not assessed   [facility] Adult PT Treatment:                                                DATE: 09/01/22 Therapeutic Exercise: IT band stretch with strap 2 x 30 sec Lunge hip flexor stretch x 60 sec each Calf stretch on wedge x 60 sec  Bodyweight squats 2 x 5 (one round with taping) Sidelying hip circles 2 x 10 each CW/CCW Hip bridge with knee extension 2 x 10  Walking with heel lift x 185 ft  Updated HEP Manual Therapy: Taping for patella maltracking   [facility] Adult PT Treatment:  DATE: 08/23/22 Therapeutic Exercise: Demonstrated and issue initial HEP.      Therapeutic Activity: Education on assessment findings that will be addressed throughout duration of POC.          PATIENT EDUCATION:  Education details: see treatment  Person educated: Patient Education method: Explanation, Demonstration, Tactile cues, Verbal cues, and Handouts Education comprehension: verbalized understanding, returned demonstration, verbal cues required, tactile cues required, and needs further education   HOME EXERCISE PROGRAM: Access  Code: [email or web address] URL: [email or web address] Date: 08/23/2022 Prepared by: Gwendolyn Grant   Exercises - Gastroc Stretch on Wall  - 1 x daily - 7 x weekly - 3 sets - 30 sec  hold - Modified Thomas Stretch  - 1 x daily - 7 x weekly - 3 sets - 30 sec hold - Supine ITB Stretch with Strap  - 1 x daily - 7 x weekly - 3 sets - 30 sec  hold   ASSESSMENT:   CLINICAL IMPRESSION: Patient continues to report knee pain with walking and stair negotiation. She is noted to have a LLD with the LLE measuring shorter than the RLE. Heel lift was issued with patient encouraged to trial this over the next week to determine if this helps with her pain. She reported pain about the anteromedial knee with squatting, so McConnell taping was tried with patient reporting a change in location of pain (localized to inferior patella) with tape donned.  HEP was updated to include gluteal strengthening with patient encouraged to adhere to prescribed HEP.    OBJECTIVE IMPAIRMENTS: decreased activity tolerance, decreased knowledge of condition, difficulty walking, decreased ROM, decreased strength, increased fascial restrictions, impaired flexibility, postural dysfunction, and pain.    ACTIVITY LIMITATIONS: carrying, lifting, bending, standing, squatting, stairs, and locomotion level   PARTICIPATION LIMITATIONS: shopping, community activity, and recreation   PERSONAL FACTORS: Profession, Time since onset of injury/illness/exacerbation, and 1 comorbidity: long QT syndrome  are also affecting patient's functional outcome.    REHAB POTENTIAL: Good   CLINICAL DECISION MAKING: Evolving/moderate complexity   EVALUATION COMPLEXITY: Moderate     GOALS: Goals reviewed with patient? Yes   SHORT TERM GOALS: Target date: 09/13/2022     Patient will be independent and compliant with initial HEP.    Baseline: issued at eval  Goal status: INITIAL   2.  Patient will demonstrate at least 10 degrees of bilateral ankle  DF AROM to reduce stress on knees with closed chain activity.  Baseline: see above  Goal status: INITIAL     LONG TERM GOALS: Target date: 10/04/2022       Patient will demonstrate 5/5 bilateral hip strength to improve stability about the chain with prolonged walking/standing activity.  Baseline: see above  Goal status: INITIAL   2.  Patient will demonstrate pain free normalized squat mechanics.  Baseline: see above  Goal status: INITIAL   3.  Patient will score at least 50 on the LEFS to signify clinically meaningful improvement in functional abilities.  Baseline: see above  Goal status: INITIAL   4.  Patient will report pain at worst rated as 5/10 to reduce her current functional limitations.  Baseline: see above  Goal status: INITIAL   5.  Patient will be independent with advanced home program to assist in management of her chronic condition.  Baseline: initial HEP issued  Goal status: INITIAL       PLAN:   PT FREQUENCY: 1x/week   PT DURATION: 6 weeks   PLANNED INTERVENTIONS: Therapeutic exercises, Therapeutic activity, Neuromuscular  re-education, Balance training, Gait training, Patient/Family education, Self Care, Joint mobilization, Dry Needling, Cryotherapy, Moist heat, Manual therapy, and Re-evaluation   PLAN FOR NEXT SESSION: review and progress HEP, glute strengthening, closed chain strengthening, heel lift response?  Gwendolyn Grant, PT, DPT, ATC 09/01/22 1:22 PM

## 2022-09-06 ENCOUNTER — Encounter: Payer: Self-pay | Admitting: Physical Therapy

## 2022-09-06 ENCOUNTER — Ambulatory Visit: Payer: BC Managed Care – PPO | Attending: Internal Medicine | Admitting: Physical Therapy

## 2022-09-06 ENCOUNTER — Other Ambulatory Visit: Payer: Self-pay

## 2022-09-06 DIAGNOSIS — R262 Difficulty in walking, not elsewhere classified: Secondary | ICD-10-CM | POA: Diagnosis present

## 2022-09-06 DIAGNOSIS — M25562 Pain in left knee: Secondary | ICD-10-CM | POA: Diagnosis present

## 2022-09-06 DIAGNOSIS — M6281 Muscle weakness (generalized): Secondary | ICD-10-CM | POA: Diagnosis present

## 2022-09-06 DIAGNOSIS — M25561 Pain in right knee: Secondary | ICD-10-CM | POA: Diagnosis present

## 2022-09-06 DIAGNOSIS — G8929 Other chronic pain: Secondary | ICD-10-CM | POA: Diagnosis present

## 2022-09-06 NOTE — Therapy (Signed)
OUTPATIENT PHYSICAL THERAPY TREATMENT NOTE   Patient Name: Courtney Rice MRN: [id number] DOB:12-17-1990, 32 y.o., female Today's Date: 09/06/2022  PCP: Janith Lima, MD REFERRING PROVIDER: Janith Lima, MD   END OF SESSION:   PT End of Session - 09/06/22 1155     Visit Number 3    Number of Visits 7    Date for PT Re-Evaluation 10/09/22    Authorization Type BCBS/ UHC MCD    PT Start Time 2536    PT Stop Time 1225    PT Time Calculation (min) 40 min    Activity Tolerance Patient tolerated treatment well    Behavior During Therapy WFL for tasks assessed/performed              Past Medical History:  Diagnosis Date   AICD (automatic cardioverter/defibrillator) present    Long Q-T syndrome Type 2    a. post partum syncope and VT PM;  b. 11/2013 s/p BSX SubQ ICD placement.   Migraine    Pre-syncope admitted 08/01/2015   a. felt to be 2/2 orthostatic hypotension - propranolol decreased.    Past Surgical History:  Procedure Laterality Date   IMPLANTABLE CARDIOVERTER DEFIBRILLATOR IMPLANT  12-20-2013   BSX SubQ ICD implanted by Dr Caryl Comes   IMPLANTABLE CARDIOVERTER DEFIBRILLATOR IMPLANT N/A 12/20/2013   Procedure: SUB Q ICD;  Surgeon: Deboraha Sprang, MD;  Location: [facility] CATH LAB;  Service: Cardiovascular;  Laterality: N/A;   SUBQ ICD CHANGEOUT N/A 10/31/2020   Procedure: SUBQ ICD CHANGEOUT;  Surgeon: Deboraha Sprang, MD;  Location: [place] CV LAB;  Service: Cardiovascular;  Laterality: N/A;   TONSILLECTOMY AND ADENOIDECTOMY  2011   Patient Active Problem List   Diagnosis Date Noted   Patellar tendonitis of both knees 08/18/2022   Generalized abdominal pain 08/18/2022   Chronic migraine without aura without status migrainosus, not intractable 10/08/2021   Current moderate episode of major depressive disorder without prior episode ([place]) 10/08/2021   ICD (implantable cardioverter-defibrillator) in place 02/16/2021   HGSIL (high grade squamous intraepithelial lesion)  on Pap smear of cervix 09/06/2019   LGSIL on Pap smear of cervix 07/12/2018   Polymorphic ventricular tachycardia ([place]) 11/30/2013   Long Q-T syndrome 06/06/2013    REFERRING DIAG:  M76.51,M76.52 (ICD-10-CM) - Patellar tendonitis of both knees    THERAPY DIAG:  Chronic pain of left knee  Chronic pain of right knee  Difficulty in walking, not elsewhere classified  Muscle weakness (generalized)  Rationale for Evaluation and Treatment Rehabilitation  PERTINENT HISTORY: Long QT syndrome; AICD  PRECAUTIONS: ICD/Pacemaker; per patient no running/vigorous exercise due to heart condition    SUBJECTIVE SUBJECTIVE STATEMENT:  Patient reports her knees are fine today. States nothing too bad regarding pain. Exercises are going good. She did stop wearing the heel lift because it hurt her right knee.  PAIN:  Are you having pain? None currently  NPRS scale: 0 (10 at worst) / 10 Pain location: bilateral anterior knee (superior and medial of the patella) Pain description: dull burning Aggravating factors: prolonged walking, steps Relieving factors: medication   OBJECTIVE: (objective measures completed at initial evaluation unless otherwise dated) PATIENT SURVEYS:  LEFS 41/68 (did not score running and hopping activity due to heart condition)   MUSCLE LENGTH: Hamstrings: WNL bilaterally    POSTURE:  Squinting patella    PALPATION: TTP distal quadriceps, medial and superior patella, medial patellofemoral ligament    LOWER EXTREMITY ROM:   Active ROM Right eval Left eval  Hip flexion      Hip extension      Hip abduction      Hip adduction      Hip internal rotation      Hip external rotation      Knee flexion WNL WNL  Knee extension WNL WNL  Ankle dorsiflexion 9 5  Ankle plantarflexion      Ankle inversion      Ankle eversion       (Blank rows = not tested)   LOWER EXTREMITY MMT:   MMT Right eval Left eval Rt / Lt 09/06/2022  Hip flexion 5 5   Hip extension 4+ 4+    Hip abduction 4 4 4  / 4  Hip adduction       Hip internal rotation       Hip external rotation       Knee flexion 5 5   Knee extension 5 5   Ankle dorsiflexion       Ankle plantarflexion       Ankle inversion       Ankle eversion        (Blank rows = not tested)   LOWER EXTREMITY SPECIAL TESTS:  Ober's (+) Thomas (+)  Valgus, Varus, McMurray's (-)    FUNCTIONAL TESTS:  Squat: excessive foot ER, varus, limited depth, WBOS; increased pain   09/01/22: leg length ASIS to medial malleolus: Lt 91.5 cm, Rt 93.5 cm   GAIT: Not assessed    TODAY'S TREATMENT [facility] Adult PT Treatment:                                                DATE: 09/06/22 Therapeutic Exercise: Recumbent bike L3 x 5 min while taking subjective Modified thomas stretch with strap 3 x 30 sec each SL bridge with knee extension 2 x 10 each Sidelying hip abduction 2 x 10 each Leg press (cybex) 60# x 10, 80# 2 x 10 Knee extension machine 20# x 10, 25# x 10 Knee flexion machine 25# x 10, 35# x 10 Goblet squat 15# 2 x 10 Lateral band walk with blue at knees 2 x 30 down/back   [facility] Adult PT Treatment:                                                DATE: 09/01/22 Therapeutic Exercise: IT band stretch with strap 2 x 30 sec Lunge hip flexor stretch x 60 sec each Calf stretch on wedge x 60 sec  Bodyweight squats 2 x 5 (one round with taping) Sidelying hip circles 2 x 10 each CW/CCW Hip bridge with knee extension 2 x 10  Walking with heel lift x 185 ft  Updated HEP Manual Therapy: Taping for patella maltracking   PATIENT EDUCATION:  Education details: HEP Person educated: Patient Education method: Consulting civil engineer, Demonstration, Corporate treasurer cues, Verbal cues Education comprehension: verbalized understanding, returned demonstration, verbal cues required, tactile cues required, and needs further education   HOME EXERCISE PROGRAM: Access Code: [email or web address]    ASSESSMENT: CLINICAL IMPRESSION: Patient tolerated therapy well  with no adverse effects. Therapy focused primarily on progressing LE strength and control with good tolerance. Incorporated machine strengthening so patient can perform these strengthening exercises at gym. She does  continue to exhibit hip abductor weakness and requires cueing throughout therapy for proper knee positioning. No pain reported with therapy but she did report muscle fatigue. No changes made to HEP. She did report no improvement in symptoms using heel lift so instructed to discontinue. Patient would benefit from continued skilled PT to progress mobility and strength in order reduce pain and maximize functional ability.    OBJECTIVE IMPAIRMENTS: decreased activity tolerance, decreased knowledge of condition, difficulty walking, decreased ROM, decreased strength, increased fascial restrictions, impaired flexibility, postural dysfunction, and pain.    ACTIVITY LIMITATIONS: carrying, lifting, bending, standing, squatting, stairs, and locomotion level   PARTICIPATION LIMITATIONS: shopping, community activity, and recreation   PERSONAL FACTORS: Profession, Time since onset of injury/illness/exacerbation, and 1 comorbidity: long QT syndrome  are also affecting patient's functional outcome.      GOALS: Goals reviewed with patient? Yes   SHORT TERM GOALS: Target date: 09/13/2022   Patient will be independent and compliant with initial HEP.  Baseline: issued at eval  Goal status: INITIAL   2.  Patient will demonstrate at least 10 degrees of bilateral ankle DF AROM to reduce stress on knees with closed chain activity.  Baseline: see above  Goal status: INITIAL     LONG TERM GOALS: Target date: 10/04/2022   Patient will demonstrate 5/5 bilateral hip strength to improve stability about the chain with prolonged walking/standing activity.  Baseline: see above  Goal status: INITIAL   2.  Patient will demonstrate pain free normalized squat mechanics.  Baseline: see above  Goal status:  INITIAL   3.  Patient will score at least 50 on the LEFS to signify clinically meaningful improvement in functional abilities.  Baseline: see above  Goal status: INITIAL   4.  Patient will report pain at worst rated as 5/10 to reduce her current functional limitations.  Baseline: see above  Goal status: INITIAL   5.  Patient will be independent with advanced home program to assist in management of her chronic condition.  Baseline: initial HEP issued  Goal status: INITIAL     PLAN: PT FREQUENCY: 1x/week   PT DURATION: 6 weeks   PLANNED INTERVENTIONS: Therapeutic exercises, Therapeutic activity, Neuromuscular re-education, Balance training, Gait training, Patient/Family education, Self Care, Joint mobilization, Dry Needling, Cryotherapy, Moist heat, Manual therapy, and Re-evaluation   PLAN FOR NEXT SESSION: review and progress HEP, glute strengthening, closed chain strengthening    Hilda Blades, PT, DPT, LAT, ATC 09/06/22  12:46 PM Phone: [phone] Fax: [phone]

## 2022-09-13 ENCOUNTER — Ambulatory Visit: Payer: BC Managed Care – PPO

## 2022-09-13 DIAGNOSIS — M25562 Pain in left knee: Secondary | ICD-10-CM | POA: Diagnosis not present

## 2022-09-13 DIAGNOSIS — G8929 Other chronic pain: Secondary | ICD-10-CM

## 2022-09-13 DIAGNOSIS — M6281 Muscle weakness (generalized): Secondary | ICD-10-CM

## 2022-09-13 DIAGNOSIS — R262 Difficulty in walking, not elsewhere classified: Secondary | ICD-10-CM

## 2022-09-13 NOTE — Therapy (Signed)
OUTPATIENT PHYSICAL THERAPY TREATMENT NOTE   Patient Name: Courtney Rice MRN: [id number] DOB:06-20-1991, 32 y.o., female Today's Date: 09/13/2022  PCP: Janith Lima, MD REFERRING PROVIDER: Janith Lima, MD   END OF SESSION:   PT End of Session - 09/13/22 1145     Visit Number 4    Number of Visits 7    Date for PT Re-Evaluation 10/09/22    Authorization Type BCBS/ UHC MCD    PT Start Time [id number]    PT Stop Time 1229    PT Time Calculation (min) 44 min    Activity Tolerance Patient tolerated treatment well    Behavior During Therapy WFL for tasks assessed/performed               Past Medical History:  Diagnosis Date   AICD (automatic cardioverter/defibrillator) present    Long Q-T syndrome Type 2    a. post partum syncope and VT PM;  b. 11/2013 s/p BSX SubQ ICD placement.   Migraine    Pre-syncope admitted 08/01/2015   a. felt to be 2/2 orthostatic hypotension - propranolol decreased.    Past Surgical History:  Procedure Laterality Date   IMPLANTABLE CARDIOVERTER DEFIBRILLATOR IMPLANT  12-20-2013   BSX SubQ ICD implanted by Dr Caryl Comes   IMPLANTABLE CARDIOVERTER DEFIBRILLATOR IMPLANT N/A 12/20/2013   Procedure: SUB Q ICD;  Surgeon: Deboraha Sprang, MD;  [facility] CATH LAB;  Service: Cardiovascular;  Laterality: N/A;   SUBQ ICD CHANGEOUT N/A 10/31/2020   Procedure: SUBQ ICD CHANGEOUT;  Surgeon: Deboraha Sprang, MD;  Location: [place] CV LAB;  Service: Cardiovascular;  Laterality: N/A;   TONSILLECTOMY AND ADENOIDECTOMY  2011   Patient Active Problem List   Diagnosis Date Noted   Patellar tendonitis of both knees 08/18/2022   Generalized abdominal pain 08/18/2022   Chronic migraine without aura without status migrainosus, not intractable 10/08/2021   Current moderate episode of major depressive disorder without prior episode ([place]) 10/08/2021   ICD (implantable cardioverter-defibrillator) in place 02/16/2021   HGSIL (high grade squamous intraepithelial  lesion) on Pap smear of cervix 09/06/2019   LGSIL on Pap smear of cervix 07/12/2018   Polymorphic ventricular tachycardia ([place]) 11/30/2013   Long Q-T syndrome 06/06/2013    REFERRING DIAG:  M76.51,M76.52 (ICD-10-CM) - Patellar tendonitis of both knees    THERAPY DIAG:  Chronic pain of left knee  Chronic pain of right knee  Difficulty in walking, not elsewhere classified  Muscle weakness (generalized)  Rationale for Evaluation and Treatment Rehabilitation  PERTINENT HISTORY: Long QT syndrome; AICD  PRECAUTIONS: ICD/Pacemaker; per patient no running/vigorous exercise due to heart condition    SUBJECTIVE SUBJECTIVE STATEMENT:  Patient reports the knees are "alright right now." She had some pain in the knees over the weekend.   PAIN:  Are you having pain? None currently  NPRS scale: 0 (6 at worst) / 10 Pain location: bilateral anterior knee (superior and medial of the patella) Pain description: dull burning Aggravating factors: prolonged walking, steps Relieving factors: medication   OBJECTIVE: (objective measures completed at initial evaluation unless otherwise dated) PATIENT SURVEYS:  LEFS 41/68 (did not score running and hopping activity due to heart condition)   MUSCLE LENGTH: Hamstrings: WNL bilaterally    POSTURE:  Squinting patella    PALPATION: TTP distal quadriceps, medial and superior patella, medial patellofemoral ligament    LOWER EXTREMITY ROM:   Active ROM Right eval Left eval 09/13/22 Right/Left   Hip flexion  Hip extension       Hip abduction       Hip adduction       Hip internal rotation       Hip external rotation       Knee flexion WNL WNL   Knee extension WNL WNL   Ankle dorsiflexion 9 5 16/10  Ankle plantarflexion       Ankle inversion       Ankle eversion        (Blank rows = not tested)   LOWER EXTREMITY MMT:   MMT Right eval Left eval Rt / Lt 09/06/2022  Hip flexion 5 5   Hip extension 4+ 4+   Hip abduction 4 4 4 $ / 4   Hip adduction       Hip internal rotation       Hip external rotation       Knee flexion 5 5   Knee extension 5 5   Ankle dorsiflexion       Ankle plantarflexion       Ankle inversion       Ankle eversion        (Blank rows = not tested)   LOWER EXTREMITY SPECIAL TESTS:  Ober's (+) Thomas (+)  Valgus, Varus, McMurray's (-)    FUNCTIONAL TESTS:  Squat: excessive foot ER, varus, limited depth, WBOS; increased pain   09/01/22: leg length ASIS to medial malleolus: Lt 91.5 cm, Rt 93.5 cm   GAIT: Not assessed    TODAY'S TREATMENT [facility] Adult PT Treatment:                                                DATE: 09/13/22 Therapeutic Exercise: Bike level 3 x 5 minutes  Wall squats with red band at thighs for abductor activation x 10  SLR square trace 2 x 10  SL bridge 2 x 10  Mini squats to table 2 x 10  Sidelying leg taps 2 x 10  Quadruped leg extension 2 x 10  Updated HEP  Manual Therapy: STM bilateral distal quadriceps  K-tape Y strip from distal quad to patella tendon @ 10% tension and I strip patella tendon @ 50% tension    [facility] Adult PT Treatment:                                                DATE: 09/06/22 Therapeutic Exercise: Recumbent bike L3 x 5 min while taking subjective Modified thomas stretch with strap 3 x 30 sec each SL bridge with knee extension 2 x 10 each Sidelying hip abduction 2 x 10 each Leg press (cybex) 60# x 10, 80# 2 x 10 Knee extension machine 20# x 10, 25# x 10 Knee flexion machine 25# x 10, 35# x 10 Goblet squat 15# 2 x 10 Lateral band walk with blue at knees 2 x 30 down/back   [facility] Adult PT Treatment:                                                DATE: 09/01/22 Therapeutic Exercise: IT band stretch with  strap 2 x 30 sec Lunge hip flexor stretch x 60 sec each Calf stretch on wedge x 60 sec  Bodyweight squats 2 x 5 (one round with taping) Sidelying hip circles 2 x 10 each CW/CCW Hip bridge with knee extension 2 x 10  Walking with heel lift  x 185 ft  Updated HEP Manual Therapy: Taping for patella maltracking   PATIENT EDUCATION:  Education details: HEP; TPDN Person educated: Patient Education method: Explanation, Demonstration, Tactile cues, Verbal cues, handout  Education comprehension: verbalized understanding, returned demonstration, verbal cues required, tactile cues required, and needs further education   HOME EXERCISE PROGRAM: Access Code: [email or web address]    ASSESSMENT: CLINICAL IMPRESSION: Patient tolerated therapy well with no adverse effects. Therapy focused primarily on progressing LE strengthening with good tolerance. She is noted to have tautness about bilateral quadriceps and would potentially benefit from TPDN at future sessions if tautness remains. Her DR AROM has improved bilaterally having met this STG. With squatting activity she continues to endorse pain superior and medial to the patella on bilateral knees during the descent (Rt>Lt) with notable mal-tracking of Rt patella. K-tape was utilized for improving patella stability with patient reporting no pain for a few reps during squat descent on the Rt knee following tape application. HEP was updated to include further strengthening.     OBJECTIVE IMPAIRMENTS: decreased activity tolerance, decreased knowledge of condition, difficulty walking, decreased ROM, decreased strength, increased fascial restrictions, impaired flexibility, postural dysfunction, and pain.    ACTIVITY LIMITATIONS: carrying, lifting, bending, standing, squatting, stairs, and locomotion level   PARTICIPATION LIMITATIONS: shopping, community activity, and recreation   PERSONAL FACTORS: Profession, Time since onset of injury/illness/exacerbation, and 1 comorbidity: long QT syndrome  are also affecting patient's functional outcome.      GOALS: Goals reviewed with patient? Yes   SHORT TERM GOALS: Target date: 09/13/2022   Patient will be independent and compliant with initial HEP.  Baseline:  issued at eval  Goal status: met   2.  Patient will demonstrate at least 10 degrees of bilateral ankle DF AROM to reduce stress on knees with closed chain activity.  Baseline: see above  Goal status: met     LONG TERM GOALS: Target date: 10/04/2022   Patient will demonstrate 5/5 bilateral hip strength to improve stability about the chain with prolonged walking/standing activity.  Baseline: see above  Goal status: INITIAL   2.  Patient will demonstrate pain free normalized squat mechanics.  Baseline: see above  Goal status: INITIAL   3.  Patient will score at least 50 on the LEFS to signify clinically meaningful improvement in functional abilities.  Baseline: see above  Goal status: INITIAL   4.  Patient will report pain at worst rated as 5/10 to reduce her current functional limitations.  Baseline: see above  Goal status: INITIAL   5.  Patient will be independent with advanced home program to assist in management of her chronic condition.  Baseline: initial HEP issued  Goal status: INITIAL     PLAN: PT FREQUENCY: 1x/week   PT DURATION: 6 weeks   PLANNED INTERVENTIONS: Therapeutic exercises, Therapeutic activity, Neuromuscular re-education, Balance training, Gait training, Patient/Family education, Self Care, Joint mobilization, Dry Needling, Cryotherapy, Moist heat, Manual therapy, and Re-evaluation   PLAN FOR NEXT SESSION: review and progress HEP, glute strengthening, closed chain strengthening   Gwendolyn Grant, PT, DPT, ATC 09/13/22 12:30 PM

## 2022-09-17 ENCOUNTER — Other Ambulatory Visit: Payer: Self-pay | Admitting: Internal Medicine

## 2022-09-17 DIAGNOSIS — G43709 Chronic migraine without aura, not intractable, without status migrainosus: Secondary | ICD-10-CM

## 2022-09-17 DIAGNOSIS — F321 Major depressive disorder, single episode, moderate: Secondary | ICD-10-CM

## 2022-09-20 ENCOUNTER — Ambulatory Visit: Payer: BC Managed Care – PPO

## 2022-09-20 DIAGNOSIS — R262 Difficulty in walking, not elsewhere classified: Secondary | ICD-10-CM

## 2022-09-20 DIAGNOSIS — G8929 Other chronic pain: Secondary | ICD-10-CM

## 2022-09-20 DIAGNOSIS — M25562 Pain in left knee: Secondary | ICD-10-CM | POA: Diagnosis not present

## 2022-09-20 DIAGNOSIS — M6281 Muscle weakness (generalized): Secondary | ICD-10-CM

## 2022-09-20 NOTE — Therapy (Signed)
OUTPATIENT PHYSICAL THERAPY TREATMENT NOTE   Patient Name: Courtney Rice MRN: [id number] DOB:[birth year], 32 y.o., female Today's Date: 09/20/2022  PCP: Janith Lima, MD REFERRING PROVIDER: Janith Lima, MD   END OF SESSION:   PT End of Session - 09/20/22 1102     Visit Number 5    Number of Visits 7    Date for PT Re-Evaluation 10/09/22    Authorization Type BCBS/ U[employer] MCD    PT Start Time 1102    PT Stop Time [id number]    PT Time Calculation (min) 43 min    Activity Tolerance Patient tolerated treatment well    Behavior During Therapy WFL for tasks assessed/performed               Past Medical History:  Diagnosis Date   AICD (automatic cardioverter/defibrillator) present    Long Q-T syndrome Type 2    a. post partum syncope and VT PM;  b. 11/2013 s/p BSX SubQ ICD placement.   Migraine    Pre-syncope admitted 08/01/2015   a. felt to be 2/2 orthostatic hypotension - propranolol decreased.    Past Surgical History:  Procedure Laterality Date   IMPLANTABLE CARDIOVERTER DEFIBRILLATOR IMPLANT  12-20-2013   BSX SubQ ICD implanted by Dr Caryl Comes   IMPLANTABLE CARDIOVERTER DEFIBRILLATOR IMPLANT N/A 12/20/2013   Procedure: SUB Q ICD;  Surgeon: Deboraha Sprang, MD;  Location: [facility] CATH LAB;  Service: Cardiovascular;  Laterality: N/A;   SUBQ ICD CHANGEOUT N/A 10/31/2020   Procedure: SUBQ ICD CHANGEOUT;  Surgeon: Deboraha Sprang, MD;  Location: [place] CV LAB;  Service: Cardiovascular;  Laterality: N/A;   TONSILLECTOMY AND ADENOIDECTOMY  2011   Patient Active Problem List   Diagnosis Date Noted   Patellar tendonitis of both knees 08/18/2022   Generalized abdominal pain 08/18/2022   Chronic migraine without aura without status migrainosus, not intractable 10/08/2021   Current moderate episode of major depressive disorder without prior episode ([place]) 10/08/2021   ICD (implantable cardioverter-defibrillator) in place 02/16/2021   HGSIL (high grade squamous intraepithelial  lesion) on Pap smear of cervix 09/06/2019   LGSIL on Pap smear of cervix 07/12/2018   Polymorphic ventricular tachycardia ([place]) 11/30/2013   Long Q-T syndrome 06/06/2013    REFERRING DIAG:  M76.51,M76.52 (ICD-10-CM) - Patellar tendonitis of both knees    THERAPY DIAG:  Chronic pain of left knee  Chronic pain of right knee  Difficulty in walking, not elsewhere classified  Muscle weakness (generalized)  Rationale for Evaluation and Treatment Rehabilitation  PERTINENT HISTORY: Long QT syndrome; AICD  PRECAUTIONS: ICD/Pacemaker; per patient no running/vigorous exercise due to heart condition    SUBJECTIVE SUBJECTIVE STATEMENT:  Patient reports a little bit of pain in the knees right now. She feels like the tape helped on her Rt knee last time and did not have pain for the 2-3 days that the tape was on.   PAIN:  Are you having pain? Yes  NPRS scale: 1/ 10 Pain location: bilateral anterior knee (superior and medial of the patella) Pain description: dull burning Aggravating factors: prolonged walking, steps Relieving factors: medication   OBJECTIVE: (objective measures completed at initial evaluation unless otherwise dated) PATIENT SURVEYS:  LEFS 41/68 (did not score running and hopping activity due to heart condition)   MUSCLE LENGTH: Hamstrings: WNL bilaterally    POSTURE:  Squinting patella    PALPATION: TTP distal quadriceps, medial and superior patella, medial patellofemoral ligament    LOWER EXTREMITY ROM:   Active ROM  Right eval Left eval 09/13/22 Right/Left   Hip flexion       Hip extension       Hip abduction       Hip adduction       Hip internal rotation       Hip external rotation       Knee flexion WNL WNL   Knee extension WNL WNL   Ankle dorsiflexion 9 5 16/10  Ankle plantarflexion       Ankle inversion       Ankle eversion        (Blank rows = not tested)   LOWER EXTREMITY MMT:   MMT Right eval Left eval Rt / Lt 09/06/2022  Hip flexion 5  5   Hip extension 4+ 4+   Hip abduction 4 4 4 $ / 4  Hip adduction       Hip internal rotation       Hip external rotation       Knee flexion 5 5   Knee extension 5 5   Ankle dorsiflexion       Ankle plantarflexion       Ankle inversion       Ankle eversion        (Blank rows = not tested)   LOWER EXTREMITY SPECIAL TESTS:  Ober's (+) Thomas (+)  Valgus, Varus, McMurray's (-)    FUNCTIONAL TESTS:  Squat: excessive foot ER, varus, limited depth, WBOS; increased pain   09/01/22: leg length ASIS to medial malleolus: Lt 91.5 cm, Rt 93.5 cm   GAIT: Not assessed    TODAY'S TREATMENT [facility] Adult PT Treatment:                                                DATE: 09/20/22 Therapeutic Exercise: Recumbent bike level 3 x 5 minutes  Eccentric Leg press 2 x 10 bilateral; 40 lbs Sumo squat 2 x 10; 10 lb kettlebell  Deadllift attempted d/c due to poor form Standing hip hinge with dowel, multiple reps Seated hip hinge with dowel, multiple reps Lateral, forward, and backward band walks green band at shins 2 x 15 ft each SL bridge 2 x 10  Updated HEP Manual Therapy: K-tape Y strip from distal quad to patella tendon @ 10% tension and I strip patella tendon @ 50% tension; bilateral knees     [facility] Adult PT Treatment:                                                DATE: 09/13/22 Therapeutic Exercise: Bike level 3 x 5 minutes  Wall squats with red band at thighs for abductor activation x 10  SLR square trace 2 x 10  SL bridge 2 x 10  Mini squats to table 2 x 10  Sidelying leg taps 2 x 10  Quadruped leg extension 2 x 10  Updated HEP  Manual Therapy: STM bilateral distal quadriceps  K-tape Y strip from distal quad to patella tendon @ 10% tension and I strip patella tendon @ 50% tension    [facility] Adult PT Treatment:  DATE: 09/06/22 Therapeutic Exercise: Recumbent bike L3 x 5 min while taking subjective Modified thomas stretch with strap 3 x 30  sec each SL bridge with knee extension 2 x 10 each Sidelying hip abduction 2 x 10 each Leg press (cybex) 60# x 10, 80# 2 x 10 Knee extension machine 20# x 10, 25# x 10 Knee flexion machine 25# x 10, 35# x 10 Goblet squat 15# 2 x 10 Lateral band walk with blue at knees 2 x 30 down/back     PATIENT EDUCATION:  Education details: HEP Person educated: Patient Education method: Explanation, Demonstration, Tactile cues, Verbal cues, handout  Education comprehension: verbalized understanding, returned demonstration, verbal cues required, tactile cues required, and needs further education   HOME EXERCISE PROGRAM: Access Code: [id number]    ASSESSMENT: CLINICAL IMPRESSION: Patient tolerated therapy well with no adverse effects. She reported relief of Rt knee pain with K-tape after last session, so K-tape was completed on bilateral knees today educating patient on how to complete this tape job independently. Continued with progression of CKC strengthening with good tolerance. Worked on hip hinge with patient having significant difficulty initially completing. With use of dowel for tactile feedback and heavy cues she is able to perform in sitting and standing. With squatting she requires consistent cues to reduce excessive anterior tibial translation. HEP updated to include further strengthening.     OBJECTIVE IMPAIRMENTS: decreased activity tolerance, decreased knowledge of condition, difficulty walking, decreased ROM, decreased strength, increased fascial restrictions, impaired flexibility, postural dysfunction, and pain.    ACTIVITY LIMITATIONS: carrying, lifting, bending, standing, squatting, stairs, and locomotion level   PARTICIPATION LIMITATIONS: shopping, community activity, and recreation   PERSONAL FACTORS: Profession, Time since onset of injury/illness/exacerbation, and 1 comorbidity: long QT syndrome  are also affecting patient's functional outcome.      GOALS: Goals reviewed with  patient? Yes   SHORT TERM GOALS: Target date: 09/13/2022   Patient will be independent and compliant with initial HEP.  Baseline: issued at eval  Goal status: met   2.  Patient will demonstrate at least 10 degrees of bilateral ankle DF AROM to reduce stress on knees with closed chain activity.  Baseline: see above  Goal status: met     LONG TERM GOALS: Target date: 10/04/2022   Patient will demonstrate 5/5 bilateral hip strength to improve stability about the chain with prolonged walking/standing activity.  Baseline: see above  Goal status: INITIAL   2.  Patient will demonstrate pain free normalized squat mechanics.  Baseline: see above  Goal status: INITIAL   3.  Patient will score at least 50 on the LEFS to signify clinically meaningful improvement in functional abilities.  Baseline: see above  Goal status: INITIAL   4.  Patient will report pain at worst rated as 5/10 to reduce her current functional limitations.  Baseline: see above  Goal status: INITIAL   5.  Patient will be independent with advanced home program to assist in management of her chronic condition.  Baseline: initial HEP issued  Goal status: INITIAL     PLAN: PT FREQUENCY: 1x/week   PT DURATION: 6 weeks   PLANNED INTERVENTIONS: Therapeutic exercises, Therapeutic activity, Neuromuscular re-education, Balance training, Gait training, Patient/Family education, Self Care, Joint mobilization, Dry Needling, Cryotherapy, Moist heat, Manual therapy, and Re-evaluation   PLAN FOR NEXT SESSION: review and progress HEP, glute strengthening, closed chain strengthening   Gwendolyn Grant, PT, DPT, ATC 09/20/22 11:46 AM

## 2022-09-27 ENCOUNTER — Ambulatory Visit: Payer: BC Managed Care – PPO

## 2022-09-27 DIAGNOSIS — R262 Difficulty in walking, not elsewhere classified: Secondary | ICD-10-CM

## 2022-09-27 DIAGNOSIS — G8929 Other chronic pain: Secondary | ICD-10-CM

## 2022-09-27 DIAGNOSIS — M6281 Muscle weakness (generalized): Secondary | ICD-10-CM

## 2022-09-27 DIAGNOSIS — M25562 Pain in left knee: Secondary | ICD-10-CM | POA: Diagnosis not present

## 2022-09-27 NOTE — Therapy (Signed)
OUTPATIENT PHYSICAL THERAPY TREATMENT NOTE   Patient Name: Courtney Rice MRN: [id number] DOB:04-29-1991, 32 y.o., female Today's Date: 09/27/2022  PCP: Janith Lima, MD REFERRING PROVIDER: Janith Lima, MD   END OF SESSION:   PT End of Session - 09/27/22 1148     Visit Number 6    Number of Visits 7    Date for PT Re-Evaluation 10/09/22    Authorization Type BCBS/ UHC MCD    PT Start Time 1147    PT Stop Time 1230    PT Time Calculation (min) 43 min    Activity Tolerance Patient tolerated treatment well    Behavior During Therapy WFL for tasks assessed/performed                Past Medical History:  Diagnosis Date   AICD (automatic cardioverter/defibrillator) present    Long Q-T syndrome Type 2    a. post partum syncope and VT PM;  b. 11/2013 s/p BSX SubQ ICD placement.   Migraine    Pre-syncope admitted 08/01/2015   a. felt to be 2/2 orthostatic hypotension - propranolol decreased.    Past Surgical History:  Procedure Laterality Date   IMPLANTABLE CARDIOVERTER DEFIBRILLATOR IMPLANT  12-20-2013   BSX SubQ ICD implanted by Dr Caryl Comes   IMPLANTABLE CARDIOVERTER DEFIBRILLATOR IMPLANT N/A 12/20/2013   Procedure: SUB Q ICD;  Surgeon: Deboraha Sprang, MD;  Location: [facility] CATH LAB;  Service: Cardiovascular;  Laterality: N/A;   SUBQ ICD CHANGEOUT N/A 10/31/2020   Procedure: SUBQ ICD CHANGEOUT;  Surgeon: Deboraha Sprang, MD;  Location: [place] CV LAB;  Service: Cardiovascular;  Laterality: N/A;   TONSILLECTOMY AND ADENOIDECTOMY  2011   Patient Active Problem List   Diagnosis Date Noted   Patellar tendonitis of both knees 08/18/2022   Generalized abdominal pain 08/18/2022   Chronic migraine without aura without status migrainosus, not intractable 10/08/2021   Current moderate episode of major depressive disorder without prior episode ([place]) 10/08/2021   ICD (implantable cardioverter-defibrillator) in place 02/16/2021   HGSIL (high grade squamous intraepithelial  lesion) on Pap smear of cervix 09/06/2019   LGSIL on Pap smear of cervix 07/12/2018   Polymorphic ventricular tachycardia ([place]) 11/30/2013   Long Q-T syndrome 06/06/2013    REFERRING DIAG:  M76.51,M76.52 (ICD-10-CM) - Patellar tendonitis of both knees    THERAPY DIAG:  Chronic pain of left knee  Chronic pain of right knee  Difficulty in walking, not elsewhere classified  Muscle weakness (generalized)  Rationale for Evaluation and Treatment Rehabilitation  PERTINENT HISTORY: Long QT syndrome; AICD  PRECAUTIONS: ICD/Pacemaker; per patient no running/vigorous exercise due to heart condition    SUBJECTIVE SUBJECTIVE STATEMENT:  Patient reports no pain currently. She was able to tape her knees on her own and workout with minimal pain. She wants to go over the tape job again to make sure she is doing it right.   PAIN:  Are you having pain? No    OBJECTIVE: (objective measures completed at initial evaluation unless otherwise dated) PATIENT SURVEYS:  LEFS 41/68 (did not score running and hopping activity due to heart condition)   MUSCLE LENGTH: Hamstrings: WNL bilaterally    POSTURE:  Squinting patella    PALPATION: TTP distal quadriceps, medial and superior patella, medial patellofemoral ligament    LOWER EXTREMITY ROM:   Active ROM Right eval Left eval 09/13/22 Right/Left   Hip flexion       Hip extension       Hip abduction  Hip adduction       Hip internal rotation       Hip external rotation       Knee flexion WNL WNL   Knee extension WNL WNL   Ankle dorsiflexion 9 5 16/10  Ankle plantarflexion       Ankle inversion       Ankle eversion        (Blank rows = not tested)   LOWER EXTREMITY MMT:   MMT Right eval Left eval Rt / Lt 09/06/2022  Hip flexion 5 5   Hip extension 4+ 4+   Hip abduction '4 4 4 '$ / 4  Hip adduction       Hip internal rotation       Hip external rotation       Knee flexion 5 5   Knee extension 5 5   Ankle dorsiflexion        Ankle plantarflexion       Ankle inversion       Ankle eversion        (Blank rows = not tested)   LOWER EXTREMITY SPECIAL TESTS:  Ober's (+) Thomas (+)  Valgus, Varus, McMurray's (-)    FUNCTIONAL TESTS:  Squat: excessive foot ER, varus, limited depth, WBOS; increased pain   09/01/22: leg length ASIS to medial malleolus: Lt 91.5 cm, Rt 93.5 cm   09/27/22: Squat: excessive anterior tibial translation  GAIT: Not assessed    TODAY'S TREATMENT [facility] Adult PT Treatment:                                                DATE: 09/27/22 Therapeutic Exercise: Recumbent bike level 3 x 5 minutes  Resisted knee extension 3 x 10 @ 30 lbs  Standing 3 way hip 2 x 10  Bodyweight squats  2 x 10  Fwd lunge x 10 each  Opposite touchdown attempted d/c due to poor form  Lateral step down x 5 each; 4 inch step  Updated HEP Manual Therapy: K-tape Y strip from distal quad to patella tendon @ 10% tension and I strip patella tendon @ 50% tension; bilateral knees; having patient actively participate in cutting and placing the tape.     [facility] Adult PT Treatment:                                                DATE: 09/20/22 Therapeutic Exercise: Recumbent bike level 3 x 5 minutes  Eccentric Leg press 2 x 10 bilateral; 40 lbs Sumo squat 2 x 10; 10 lb kettlebell  Deadllift attempted d/c due to poor form Standing hip hinge with dowel, multiple reps Seated hip hinge with dowel, multiple reps Lateral, forward, and backward band walks green band at shins 2 x 15 ft each SL bridge 2 x 10  Updated HEP Manual Therapy: K-tape Y strip from distal quad to patella tendon @ 10% tension and I strip patella tendon @ 50% tension; bilateral knees     [facility] Adult PT Treatment:  DATE: 09/13/22 Therapeutic Exercise: Bike level 3 x 5 minutes  Wall squats with red band at thighs for abductor activation x 10  SLR square trace 2 x 10  SL bridge 2 x 10  Mini squats to table 2  x 10  Sidelying leg taps 2 x 10  Quadruped leg extension 2 x 10  Updated HEP  Manual Therapy: STM bilateral distal quadriceps  K-tape Y strip from distal quad to patella tendon @ 10% tension and I strip patella tendon @ 50% tension      PATIENT EDUCATION:  Education details: HEP Person educated: Patient Education method: Consulting civil engineer, Demonstration, Corporate treasurer cues, Verbal cues, handout  Education comprehension: verbalized understanding, returned demonstration, verbal cues required, tactile cues required, and needs further education   HOME EXERCISE PROGRAM: Access Code: [email or web address]    ASSESSMENT: CLINICAL IMPRESSION: Patient tolerated therapy well with no adverse effects. She was able to utilize K-tape since last session reporting minimal pain during her workout, but requested to review the tape procedure as she was not sure she applied correctly. Reviewed patellofemoral taping with patient able to participate in cutting and applying the tape to improve her independence with application at home. Continued with progression of hip and knee strengthening with good tolerance. With lunge activity she has difficulty controlling valgus collapse and maintaining upright trunk. With SL CKC she has significantly difficulty controlling valgus collapse requiring consistent cues to correct. HEP was updated to include further strengthening.     OBJECTIVE IMPAIRMENTS: decreased activity tolerance, decreased knowledge of condition, difficulty walking, decreased ROM, decreased strength, increased fascial restrictions, impaired flexibility, postural dysfunction, and pain.    ACTIVITY LIMITATIONS: carrying, lifting, bending, standing, squatting, stairs, and locomotion level   PARTICIPATION LIMITATIONS: shopping, community activity, and recreation   PERSONAL FACTORS: Profession, Time since onset of injury/illness/exacerbation, and 1 comorbidity: long QT syndrome  are also affecting patient's functional outcome.       GOALS: Goals reviewed with patient? Yes   SHORT TERM GOALS: Target date: 09/13/2022   Patient will be independent and compliant with initial HEP.  Baseline: issued at eval  Goal status: met   2.  Patient will demonstrate at least 10 degrees of bilateral ankle DF AROM to reduce stress on knees with closed chain activity.  Baseline: see above  Goal status: met     LONG TERM GOALS: Target date: 10/04/2022   Patient will demonstrate 5/5 bilateral hip strength to improve stability about the chain with prolonged walking/standing activity.  Baseline: see above  Goal status: INITIAL   2.  Patient will demonstrate pain free normalized squat mechanics.  Baseline: see above  Goal status: INITIAL   3.  Patient will score at least 50 on the LEFS to signify clinically meaningful improvement in functional abilities.  Baseline: see above  Goal status: INITIAL   4.  Patient will report pain at worst rated as 5/10 to reduce her current functional limitations.  Baseline: see above  Goal status: INITIAL   5.  Patient will be independent with advanced home program to assist in management of her chronic condition.  Baseline: initial HEP issued  Goal status: INITIAL     PLAN: PT FREQUENCY: 1x/week   PT DURATION: 6 weeks   PLANNED INTERVENTIONS: Therapeutic exercises, Therapeutic activity, Neuromuscular re-education, Balance training, Gait training, Patient/Family education, Self Care, Joint mobilization, Dry Needling, Cryotherapy, Moist heat, Manual therapy, and Re-evaluation   PLAN FOR NEXT SESSION: review and progress HEP, glute strengthening, closed chain strengthening; re-cert vs d/c?  Gwendolyn Grant, PT, DPT, ATC 09/27/22 12:30 PM

## 2022-09-29 ENCOUNTER — Ambulatory Visit (INDEPENDENT_AMBULATORY_CARE_PROVIDER_SITE_OTHER): Payer: BC Managed Care – PPO | Admitting: Gastroenterology

## 2022-09-29 ENCOUNTER — Encounter: Payer: Self-pay | Admitting: Gastroenterology

## 2022-09-29 VITALS — BP 100/80 | HR 80 | Ht 64.0 in | Wt 132.4 lb

## 2022-09-29 DIAGNOSIS — R1030 Lower abdominal pain, unspecified: Secondary | ICD-10-CM | POA: Diagnosis not present

## 2022-09-29 DIAGNOSIS — R194 Change in bowel habit: Secondary | ICD-10-CM

## 2022-09-29 DIAGNOSIS — R140 Abdominal distension (gaseous): Secondary | ICD-10-CM | POA: Diagnosis not present

## 2022-09-29 DIAGNOSIS — R110 Nausea: Secondary | ICD-10-CM

## 2022-09-29 DIAGNOSIS — K5900 Constipation, unspecified: Secondary | ICD-10-CM | POA: Diagnosis not present

## 2022-09-29 MED ORDER — DICYCLOMINE HCL 10 MG PO CAPS: 10.0000 mg | ORAL_CAPSULE | Freq: Four times a day (QID) | ORAL | 3 refills | Status: DC | PRN

## 2022-09-29 NOTE — Progress Notes (Signed)
Chief Complaint: Constipation, change in bowel habits, abdominal pain, bloating, nausea   Referring Provider:     Janith Lima, MD    HPI:     Courtney Rice is a 32 y.o. female with a history of long QT syndrome s/p AICD placement 2015, migraines, referred to the [facility] for evaluation of multiple GI symptoms, to include change in bowel habits, constipation, abdominal pain/cramping, bloating, nausea.  Constipation has been present for many years, but worse over the last 2 years or so.  Will typically have 0-2 stools per week, typically hard stools with straining to have BM.  No hematochezia, melena. Lower abdominal pain and cramping for the last 2-3 years. Occurs 1-2 times/week. Abdominal bloating for at least 1 year, and also worse over the last few months.   Pain worse with eating ranch dressing, with associated nausea (w/o emesis). Also some intolerance to dairy, chips, beef, and tries to eat less of these.   Can occasionally have nausea independent of pain.   Has tried Pepto with some improvement. Drinks 5-6 bottles/water per day. Tried Miralax in the past with variable response. No laxatives, stool softeners, etc. in a long time.   Was seen by her PCM for this in 08/2022. - 08/18/2022: Lymphocytes 63% (stable from previous), otherwise normal CBC.  Normal BMP, liver enzymes, TSH, lipase, CRP, UA   No recent abdominal imaging for review.  No previous EGD or colonoscopy.  Mother with "a lot of stomach issues" and colon poylps (she also follows at [facility] as well). Otherwise, no known family history of CRC, GI malignancy, liver disease, pancreatic disease, or IBD.     Past Medical History:  Diagnosis Date   AICD (automatic cardioverter/defibrillator) present    Long Q-T syndrome Type 2    a. post partum syncope and VT PM;  b. 11/2013 s/p BSX SubQ ICD placement.   Migraine    Pre-syncope admitted 08/01/2015   a. felt to be 2/2 orthostatic  hypotension - propranolol decreased.      Past Surgical History:  Procedure Laterality Date   IMPLANTABLE CARDIOVERTER DEFIBRILLATOR IMPLANT  12-20-2013   BSX SubQ ICD implanted by Dr Caryl Comes   IMPLANTABLE CARDIOVERTER DEFIBRILLATOR IMPLANT N/A 12/20/2013   Procedure: SUB Q ICD;  Surgeon: Deboraha Sprang, MD;  Location: [facility] CATH LAB;  Service: Cardiovascular;  Laterality: N/A;   SUBQ ICD CHANGEOUT N/A 10/31/2020   Procedure: SUBQ ICD CHANGEOUT;  Surgeon: Deboraha Sprang, MD;  Location: [facility] CV LAB;  Service: Cardiovascular;  Laterality: N/A;   TONSILLECTOMY AND ADENOIDECTOMY  2011   Family History  Problem Relation Age of Onset   Heart disease Mother    Diabetes Mother    Asthma Mother    COPD Mother    Lupus Mother    Stroke Mother    Hypertension Mother    Colon polyps Mother    Fibroids Mother    Kidney disease Mother    Fibromyalgia Mother    Sarcoidosis Mother    Cervical cancer Mother    Irritable bowel syndrome Mother    Liver disease Mother    Cystic fibrosis Mother    COPD Maternal Grandmother    Heart disease Maternal Grandmother    Breast cancer Maternal Aunt    Hearing loss Neg Hx    Heart attack Neg Hx    Social History   Tobacco Use   Smoking status: Never  Smokeless tobacco: Never  Vaping Use   Vaping Use: Never used  Substance Use Topics   Alcohol use: No   Drug use: No   Current Outpatient Medications  Medication Sig Dispense Refill   ibuprofen (ADVIL) 800 MG tablet Take 1 tablet (800 mg total) by mouth every 8 (eight) hours as needed. 30 tablet 5   Ubrogepant (UBRELVY) 50 MG TABS Take 1 tablet by mouth as needed. 6 tablet 2   Atogepant (QULIPTA) 60 MG TABS Take 1 tablet (60 mg total) by mouth daily. (Patient not taking: Reported on 09/29/2022) 90 tablet 1   DULoxetine (CYMBALTA) 30 MG capsule TAKE 1 CAPSULE(30 MG) BY MOUTH DAILY (Patient not taking: Reported on 09/29/2022) 30 capsule 0   traZODone (DESYREL) 50 MG tablet Take 1 tablet (50 mg  total) by mouth at bedtime. (Patient not taking: Reported on 09/29/2022) 90 tablet 0   No current facility-administered medications for this visit.   No Known Allergies   Review of Systems: All systems reviewed and negative except where noted in HPI.     Physical Exam:    Wt Readings from Last 3 Encounters:  09/29/22 132 lb 6 oz (60 kg)  08/18/22 129 lb (58.5 kg)  05/20/22 129 lb (58.5 kg)    BP 100/80 (BP Location: Left Arm, Patient Position: Sitting, Cuff Size: Normal)   Pulse 80   Ht '5\' 4"'$  (1.626 m)   Wt 132 lb 6 oz (60 kg)   LMP 09/26/2022   BMI 22.72 kg/m  Constitutional:  Pleasant, in no acute distress. Psychiatric: Normal mood and affect. Behavior is normal. Cardiovascular: Normal rate, regular rhythm. No edema Pulmonary/chest: Effort normal and breath sounds normal. No wheezing, rales or rhonchi. Abdominal: Soft, nondistended, nontender. Bowel sounds active throughout. There are no masses palpable. No hepatomegaly. Neurological: Alert and oriented to person place and time. Skin: Skin is warm and dry. No rashes noted.   ASSESSMENT AND PLAN;   1) Constipation 2) Change in bowel habits 3) Lower abdominal pain 4) Nausea without emesis 5) Abdominal bloating  Longstanding history of constipation with subsequent lower abdominal pain, nausea, generalized bloating.  Discussed potential etiologies to include colonic dysmotility, pelvic floor dyssynergia, or mucosal/luminal etiologies.  Variable response to MiraLAX in the past.  Plan for the following:  - Referral for pelvic floor PT for high clinical suspicion for pelvic floor dyssynergia - Discussed role/utility of colonoscopy to evaluate for additional mucosal/luminal etiology.  Based on duration of symptoms and patient concerns, she would like to proceed with colonoscopy - Schedule colonoscopy with extended 2-day bowel preparation - Ok to use MiraLAX or other laxative/stool softener as needed - Trial Bentyl 10 mg  every 6 hours as needed for abdominal cramping/pain - Briefly discussed the role/utility of additional medications such as Linzess, Amitiza, etc. pending results   RTC in 6 months or sooner prn  The indications, risks, and benefits of colonoscopy were explained to the patient in detail. Risks include but are not limited to bleeding, perforation, adverse reaction to medications, and cardiopulmonary compromise. Sequelae include but are not limited to the possibility of surgery, hospitalization, and mortality. The patient verbalized understanding and wished to proceed. All questions answered, referred to the scheduler and bowel prep ordered. Further recommendations pending results of the exam.     Lavena Bullion, DO, FACG  09/29/2022, 9:27 AM   Janith Lima, MD

## 2022-09-29 NOTE — Patient Instructions (Addendum)
  We have sent the following medications to your pharmacy for you to pick up at your convenience: Bentyl 10 MG  We have given you a sample of Clenpiq for Colonoscopy Prep.  We have placed a referral for Pelvic Floor PT.  Please follow up in 6 months. Give [place] a call at [phone] to schedule an appointment.  _______________________________________________________  If your blood pressure at your visit was 140/90 or greater, please contact your primary care physician to follow up on this.  _______________________________________________________  If you are age 31 or younger, your body mass index should be between 19-25. Your Body mass index is 22.72 kg/m. If this is out of the aformentioned range listed, please consider follow up with your Primary Care Provider.   __________________________________________________________  The [facility] providers would like to encourage you to use [facility] to communicate with providers for non-urgent requests or questions.  Due to long hold times on the telephone, sending your provider a message by [facility] may be a faster and more efficient way to get a response.  Please allow 48 business hours for a response.  Please remember that this is for non-urgent requests.   Due to recent changes in healthcare laws, you may see the results of your imaging and laboratory studies on MyChart before your provider has had a chance to review them.  We understand that in some cases there may be results that are confusing or concerning to you. Not all laboratory results come back in the same time frame and the provider may be waiting for multiple results in order to interpret others.  Please give [place] 48 hours in order for your provider to thoroughly review all the results before contacting the office for clarification of your results.    Thank you for choosing me and [facility].  Vito Cirigliano, D.O.

## 2022-10-04 ENCOUNTER — Other Ambulatory Visit: Payer: Self-pay

## 2022-10-04 ENCOUNTER — Encounter: Payer: Self-pay | Admitting: Physical Therapy

## 2022-10-04 ENCOUNTER — Ambulatory Visit: Payer: BC Managed Care – PPO | Attending: Internal Medicine | Admitting: Physical Therapy

## 2022-10-04 DIAGNOSIS — M25562 Pain in left knee: Secondary | ICD-10-CM | POA: Diagnosis not present

## 2022-10-04 DIAGNOSIS — R262 Difficulty in walking, not elsewhere classified: Secondary | ICD-10-CM | POA: Insufficient documentation

## 2022-10-04 DIAGNOSIS — G8929 Other chronic pain: Secondary | ICD-10-CM | POA: Insufficient documentation

## 2022-10-04 DIAGNOSIS — M6281 Muscle weakness (generalized): Secondary | ICD-10-CM | POA: Insufficient documentation

## 2022-10-04 DIAGNOSIS — M25561 Pain in right knee: Secondary | ICD-10-CM | POA: Diagnosis present

## 2022-10-04 NOTE — Therapy (Signed)
OUTPATIENT PHYSICAL THERAPY TREATMENT NOTE   Patient Name: Courtney Rice MRN: [id number] DOB:[birth year], 32 y.o., female Today's Date: 10/04/2022  PCP: Janith Lima, MD REFERRING PROVIDER: Janith Lima, MD   END OF SESSION:   PT End of Session - 10/04/22 1240     Visit Number 7    Number of Visits 7    Date for PT Re-Evaluation 10/09/22    Authorization Type BCBS/ [employer] MCD    PT Start Time 1150    PT Stop Time 1230    PT Time Calculation (min) 40 min    Activity Tolerance Patient tolerated treatment well    Behavior During Therapy WFL for tasks assessed/performed                 Past Medical History:  Diagnosis Date   AICD (automatic cardioverter/defibrillator) present    Long Q-T syndrome Type 2    a. post partum syncope and VT PM;  b. 11/2013 s/p BSX SubQ ICD placement.   Migraine    Pre-syncope admitted 08/01/2015   a. felt to be 2/2 orthostatic hypotension - propranolol decreased.    Past Surgical History:  Procedure Laterality Date   IMPLANTABLE CARDIOVERTER DEFIBRILLATOR IMPLANT  12-20-2013   BSX SubQ ICD implanted by Dr Caryl Comes   IMPLANTABLE CARDIOVERTER DEFIBRILLATOR IMPLANT N/A 12/20/2013   Procedure: SUB Q ICD;  Surgeon: Deboraha Sprang, MD;  Location: [facility] CATH LAB;  Service: Cardiovascular;  Laterality: N/A;   SUBQ ICD CHANGEOUT N/A 10/31/2020   Procedure: SUBQ ICD CHANGEOUT;  Surgeon: Deboraha Sprang, MD;  Location: [place] CV LAB;  Service: Cardiovascular;  Laterality: N/A;   TONSILLECTOMY AND ADENOIDECTOMY  2011   Patient Active Problem List   Diagnosis Date Noted   Patellar tendonitis of both knees 08/18/2022   Generalized abdominal pain 08/18/2022   Chronic migraine without aura without status migrainosus, not intractable 10/08/2021   Current moderate episode of major depressive disorder without prior episode ([place]) 10/08/2021   ICD (implantable cardioverter-defibrillator) in place 02/16/2021   HGSIL (high grade squamous intraepithelial  lesion) on Pap smear of cervix 09/06/2019   LGSIL on Pap smear of cervix 07/12/2018   Polymorphic ventricular tachycardia ([place]) 11/30/2013   Long Q-T syndrome 06/06/2013    REFERRING DIAG:  M76.51,M76.52 (ICD-10-CM) - Patellar tendonitis of both knees    THERAPY DIAG:  Chronic pain of left knee  Chronic pain of right knee  Difficulty in walking, not elsewhere classified  Muscle weakness (generalized)  Rationale for Evaluation and Treatment Rehabilitation  PERTINENT HISTORY: Long QT syndrome; AICD  PRECAUTIONS: ICD/Pacemaker; per patient no running/vigorous exercise due to heart condition    SUBJECTIVE SUBJECTIVE STATEMENT:  She reports that she still uses the tape on her knees and has been walking on the treadmill up to 2 miles, states with the tape she has very little knee pain.  PAIN:  Are you having pain? No    OBJECTIVE: (objective measures completed at initial evaluation unless otherwise dated) PATIENT SURVEYS:  LEFS 41/68 (did not score running and hopping activity due to heart condition)  10/04/2022: 53 / 68   MUSCLE LENGTH: Hamstrings: WNL bilaterally    POSTURE:  Squinting patella    PALPATION: TTP distal quadriceps, medial and superior patella, medial patellofemoral ligament    LOWER EXTREMITY ROM:   Active ROM Right eval Left eval 09/13/22 Right/Left   Hip flexion       Hip extension       Hip abduction  Hip adduction       Hip internal rotation       Hip external rotation       Knee flexion WNL WNL   Knee extension WNL WNL   Ankle dorsiflexion 9 5 16/10  Ankle plantarflexion       Ankle inversion       Ankle eversion        (Blank rows = not tested)   LOWER EXTREMITY MMT:   MMT Right eval Left eval Rt / Lt 09/06/2022 Rt / Lt 10/04/2022  Hip flexion 5 5    Hip extension 4+ 4+  4+ / 4+  Hip abduction '4 4 4 '$ / 4 4+ / 4+  Hip adduction        Hip internal rotation        Hip external rotation        Knee flexion 5 5    Knee extension  5 5    Ankle dorsiflexion        Ankle plantarflexion        Ankle inversion        Ankle eversion         (Blank rows = not tested)   LOWER EXTREMITY SPECIAL TESTS:  Ober's (+) Thomas (+)  Valgus, Varus, McMurray's (-)    FUNCTIONAL TESTS:  Squat: excessive foot ER, varus, limited depth, WBOS; increased pain   09/27/22: excessive anterior tibial translation 3/4/320: proper form  09/01/22: leg length ASIS to medial malleolus: Lt 91.5 cm, Rt 93.5 cm    TODAY'S TREATMENT [facility] Adult PT Treatment:                                                DATE: 10/04/22 Therapeutic Exercise: Elliptical L1 R1 x 5 min while taking subjective SL bridge with knee extension x 10 each SLR x 10 each Sidelying hip circles x 15 each Side plank on knees with clamshell x 15 each Squat to table touch x 10, holding 15# 2 x 10 Bulgarian split squat 2 x 10 each   [facility] Adult PT Treatment:                                                DATE: 09/27/22 Therapeutic Exercise: Recumbent bike level 3 x 5 minutes  Resisted knee extension 3 x 10 @ 30 lbs  Standing 3 way hip 2 x 10  Bodyweight squats  2 x 10  Fwd lunge x 10 each  Opposite touchdown attempted d/c due to poor form  Lateral step down x 5 each; 4 inch step  Updated HEP Manual Therapy: K-tape Y strip from distal quad to patella tendon @ 10% tension and I strip patella tendon @ 50% tension; bilateral knees; having patient actively participate in cutting and placing the tape.   [facility] Adult PT Treatment:                                                DATE: 09/20/22 Therapeutic Exercise: Recumbent bike level 3 x 5 minutes  Eccentric Leg press 2 x 10  bilateral; 40 lbs Sumo squat 2 x 10; 10 lb kettlebell  Deadllift attempted d/c due to poor form Standing hip hinge with dowel, multiple reps Seated hip hinge with dowel, multiple reps Lateral, forward, and backward band walks green band at shins 2 x 15 ft each SL bridge 2 x 10  Updated HEP Manual  Therapy: K-tape Y strip from distal quad to patella tendon @ 10% tension and I strip patella tendon @ 50% tension; bilateral knees    PATIENT EDUCATION:  Education details: HEP update, POC discharge as patient is about to begin pelvic floor therapy Person educated: Patient Education method: Explanation, Handout  Education comprehension: Verbalized understanding, returned demonstration   HOME EXERCISE PROGRAM: Access Code: [email or web address]    ASSESSMENT: CLINICAL IMPRESSION: Patient tolerated therapy well with no adverse effects. She has made great progress in therapy, demonstrating improved strength, movement and squat technique, and reports improved functional ability with less pain. She does continue to report knee pain with activity but was able to perform all exercises this visit without tape and with no knee pain. She is independent with her HEP and applying tape which allows her to exercises with minimal pain, so patient will be discharged from PT to continue progressing her strength and functional ability.    OBJECTIVE IMPAIRMENTS: decreased activity tolerance, decreased knowledge of condition, difficulty walking, decreased ROM, decreased strength, increased fascial restrictions, impaired flexibility, postural dysfunction, and pain.    ACTIVITY LIMITATIONS: carrying, lifting, bending, standing, squatting, stairs, and locomotion level   PARTICIPATION LIMITATIONS: shopping, community activity, and recreation   PERSONAL FACTORS: Profession, Time since onset of injury/illness/exacerbation, and 1 comorbidity: long QT syndrome  are also affecting patient's functional outcome.      GOALS: Goals reviewed with patient? Yes   SHORT TERM GOALS: Target date: 09/13/2022   Patient will be independent and compliant with initial HEP.  Baseline: issued at eval  Goal status: met   2.  Patient will demonstrate at least 10 degrees of bilateral ankle DF AROM to reduce stress on knees with closed chain  activity.  Baseline: see above  Goal status: met   LONG TERM GOALS: Target date: 10/04/2022   Patient will demonstrate 5/5 bilateral hip strength to improve stability about the chain with prolonged walking/standing activity.  Baseline: see above  10/04/2022: 4+/5 MMT Goal status: PARTIALLY MET   2.  Patient will demonstrate pain free normalized squat mechanics.  Baseline: see above  10/04/2022: squat mechanics normal, no knee pain reported Goal status: MET   3.  Patient will score at least 50 on the LEFS to signify clinically meaningful improvement in functional abilities.  Baseline: see above  10/04/2022: 53 / 68 Goal status: MET   4.  Patient will report pain at worst rated as 5/10 to reduce her current functional limitations.  Baseline: see above  10/04/2022: 5/10 pain worst Goal status: MET   5.  Patient will be independent with advanced home program to assist in management of her chronic condition.  Baseline: initial HEP issued  10/04/2022: independent Goal status: MET     PLAN: PT FREQUENCY: 1x/week   PT DURATION: 6 weeks   PLANNED INTERVENTIONS: Therapeutic exercises, Therapeutic activity, Neuromuscular re-education, Balance training, Gait training, Patient/Family education, Self Care, Joint mobilization, Dry Needling, Cryotherapy, Moist heat, Manual therapy, and Re-evaluation   PLAN FOR NEXT SESSION: NA - discharge   Hilda Blades, PT, DPT, LAT, ATC 10/04/22  12:54 PM Phone: [phone] Fax: [phone]   PHYSICAL THERAPY DISCHARGE SUMMARY  Visits from Start of Care: 7  Current functional level related to goals / functional outcomes: See above   Remaining deficits: See above   Education / Equipment: HEP   Patient agrees to discharge. Patient goals were partially met. Patient is being discharged due to being pleased with the current functional level.

## 2022-10-04 NOTE — Patient Instructions (Signed)
Access Code: [email or web address] URL: [email or web address] Date: 10/04/2022 Prepared by: Hilda Blades  Exercises - Gastroc Stretch on Wall  - 1 x daily - 7 x weekly - 3 sets - 30 sec  hold - Modified Thomas Stretch  - 1 x daily - 7 x weekly - 3 sets - 30 sec hold - Supine ITB Stretch with Strap  - 1 x daily - 7 x weekly - 3 sets - 30 sec  hold - Sidelying Hip Circles  - 1 x daily - 2 sets - 10 reps - Single Leg Bridge  - 1 x daily - 2 sets - 10 reps - Supine Active Straight Leg Raise  - 1 x daily - 7 x weekly - 2 sets - 10 reps - Sidelying Diagonal Hip Abduction  - 1 x daily - 7 x weekly - 2 sets - 10 reps - Quadruped Alternating Leg Extensions  - 1 x daily - 7 x weekly - 2 sets - 10 reps - Side Stepping with Resistance at Ankles  - 1 x daily - 7 x weekly - 2 sets - 10 reps - Reverse Band Walks  - 1 x daily - 7 x weekly - 2 sets - 10 reps - Band Walks  - 1 x daily - 7 x weekly - 2 sets - 10 reps - Standing 3-way Hip with Walker  - 1 x daily - 7 x weekly - 2 sets - 10 reps - Goblet Squat with Kettlebell  - 3 sets - 10 reps - Single Leg Lunge with Foot on Bench  - 3 sets - 10 reps

## 2022-10-12 ENCOUNTER — Encounter: Payer: Self-pay | Admitting: Gastroenterology

## 2022-10-12 ENCOUNTER — Ambulatory Visit (AMBULATORY_SURGERY_CENTER): Payer: BC Managed Care – PPO | Admitting: Gastroenterology

## 2022-10-12 VITALS — BP 109/81 | HR 65 | Temp 98.7°F | Resp 14 | Ht 64.0 in | Wt 132.0 lb

## 2022-10-12 DIAGNOSIS — R194 Change in bowel habit: Secondary | ICD-10-CM

## 2022-10-12 DIAGNOSIS — R1030 Lower abdominal pain, unspecified: Secondary | ICD-10-CM

## 2022-10-12 DIAGNOSIS — D125 Benign neoplasm of sigmoid colon: Secondary | ICD-10-CM | POA: Diagnosis not present

## 2022-10-12 DIAGNOSIS — K5900 Constipation, unspecified: Secondary | ICD-10-CM

## 2022-10-12 DIAGNOSIS — D123 Benign neoplasm of transverse colon: Secondary | ICD-10-CM

## 2022-10-12 DIAGNOSIS — R140 Abdominal distension (gaseous): Secondary | ICD-10-CM

## 2022-10-12 MED ORDER — SODIUM CHLORIDE 0.9 % IV SOLN: 500.0000 mL | Freq: Once | INTRAVENOUS | Status: DC

## 2022-10-12 NOTE — Op Note (Signed)
[facility] Patient Name: Courtney Rice Procedure Date: 10/12/2022 11:41 AM MRN: [id number] Endoscopist: Gerrit Heck , MD, [id number] Age: 32 Referring MD:  Date of Birth: 05-Dec-1990 Gender: Female Account #: [id number] Procedure:                Colonoscopy Indications:              Lower abdominal pain, Change in bowel habits,                            Constipation Medicines:                Monitored Anesthesia Care Procedure:                Pre-Anesthesia Assessment:                           - Prior to the procedure, a History and Physical                            was performed, and patient medications and                            allergies were reviewed. The patient's tolerance of                            previous anesthesia was also reviewed. The risks                            and benefits of the procedure and the sedation                            options and risks were discussed with the patient.                            All questions were answered, and informed consent                            was obtained. Prior Anticoagulants: The patient has                            taken no anticoagulant or antiplatelet agents. ASA                            Grade Assessment: II - A patient with mild systemic                            disease. After reviewing the risks and benefits,                            the patient was deemed in satisfactory condition to                            undergo the procedure.  After obtaining informed consent, the colonoscope                            was passed under direct vision. Throughout the                            procedure, the patient's blood pressure, pulse, and                            oxygen saturations were monitored continuously. The                            Olympus SN [id number] was introduced through the anus                            and advanced to the the terminal ileum. The                             colonoscopy was performed without difficulty. The                            patient tolerated the procedure well. The quality                            of the bowel preparation was excellent. The                            terminal ileum, ileocecal valve, appendiceal                            orifice, and rectum were photographed. Scope In: 11:45:56 AM Scope Out: 12:01:07 PM Scope Withdrawal Time: 0 hours 10 minutes 4 seconds  Total Procedure Duration: 0 hours 15 minutes 11 seconds  Findings:                 The perianal and digital rectal examinations were                            normal.                           Two sessile polyps were found in the sigmoid colon                            and transverse colon. The polyps were 3 to 4 mm in                            size. These polyps were removed with a cold snare.                            Resection and retrieval were complete. Estimated                            blood loss was minimal.  The sigmoid colon was moderately tortuous.                           The exam was otherwise normal throughout the                            remainder of the colon. There were no areas of                            mucosal erythema, edema, erosions, or ulceration,                            and no areas of luminal narrowing or strictures                            noted.                           Retroflexion in the rectum was not performed due to                            anatomy.                           The terminal ileum appeared normal. Complications:            No immediate complications. Estimated Blood Loss:     Estimated blood loss was minimal. Impression:               - Two 3 to 4 mm polyps in the sigmoid colon and in                            the transverse colon, removed with a cold snare.                            Resected and retrieved.                           -  Tortuous colon.                           - The examined portion of the ileum was normal.                           - The GI Genius (intelligent endoscopy module),                            computer-aided polyp detection system powered by AI                            was utilized to detect colorectal polyps through                            enhanced visualization during colonoscopy. Recommendation:           - Patient has a contact number available  for                            emergencies. The signs and symptoms of potential                            delayed complications were discussed with the                            patient. Return to normal activities tomorrow.                            Written discharge instructions were provided to the                            patient.                           - Resume previous diet.                           - Continue present medications.                           - Await pathology results.                           - Repeat colonoscopy for surveillance based on                            pathology results. Gerrit Heck, MD 10/12/2022 12:10:04 PM

## 2022-10-12 NOTE — Progress Notes (Signed)
Pt's states no medical or surgical changes since previsit or office visit. 

## 2022-10-12 NOTE — Progress Notes (Signed)
Called to room to assist during endoscopic procedure.  Patient ID and intended procedure confirmed with present staff. Received instructions for my participation in the procedure from the performing physician.  

## 2022-10-12 NOTE — Patient Instructions (Signed)
Discharge instructions given. handouts on polyps and Hemorrhoids. Resume previous medications. YOU HAD AN ENDOSCOPIC PROCEDURE TODAY AT [facility]:   Refer to the procedure report that was given to you for any specific questions about what was found during the examination.  If the procedure report does not answer your questions, please call your gastroenterologist to clarify.  If you requested that your care partner not be given the details of your procedure findings, then the procedure report has been included in a sealed envelope for you to review at your convenience later.  YOU SHOULD EXPECT: Some feelings of bloating in the abdomen. Passage of more gas than usual.  Walking can help get rid of the air that was put into your GI tract during the procedure and reduce the bloating. If you had a lower endoscopy (such as a colonoscopy or flexible sigmoidoscopy) you may notice spotting of blood in your stool or on the toilet paper. If you underwent a bowel prep for your procedure, you may not have a normal bowel movement for a few days.  Please Note:  You might notice some irritation and congestion in your nose or some drainage.  This is from the oxygen used during your procedure.  There is no need for concern and it should clear up in a day or so.  SYMPTOMS TO REPORT IMMEDIATELY:  Following lower endoscopy (colonoscopy or flexible sigmoidoscopy):  Excessive amounts of blood in the stool  Significant tenderness or worsening of abdominal pains  Swelling of the abdomen that is new, acute  Fever of 100F or higher   For urgent or emergent issues, a gastroenterologist can be reached at any hour by calling [phone]. Do not use MyChart messaging for urgent concerns.    DIET:  We do recommend a small meal at first, but then you may proceed to your regular diet.  Drink plenty of fluids but you should avoid alcoholic beverages for 24 hours.  ACTIVITY:  You should plan to take it  easy for the rest of today and you should NOT DRIVE or use heavy machinery until tomorrow (because of the sedation medicines used during the test).    FOLLOW UP: Our staff will call the number listed on your records the next business day following your procedure.  We will call around 7:15- 8:00 am to check on you and address any questions or concerns that you may have regarding the information given to you following your procedure. If we do not reach you, we will leave a message.     If any biopsies were taken you will be contacted by phone or by letter within the next 1-3 weeks.  Please call us at [phone] if you have not heard about the biopsies in 3 weeks.    SIGNATURES/CONFIDENTIALITY: You and/or your care partner have signed paperwork which will be entered into your electronic medical record.  These signatures attest to the fact that that the information above on your After Visit Summary has been reviewed and is understood.  Full responsibility of the confidentiality of this discharge information lies with you and/or your care-partner.

## 2022-10-12 NOTE — Progress Notes (Signed)
Report to PACU, RN, vss, BBS= Clear.  

## 2022-10-12 NOTE — Progress Notes (Signed)
GASTROENTEROLOGY PROCEDURE H&P NOTE   Primary Care Physician: Janith Lima, MD    Reason for Procedure:   Constipation, change in bowel habits, lower abdominal pain, bloating  Plan:    Colonoscopy  Patient is appropriate for endoscopic procedure(s) in the ambulatory ([facility]) setting.  The nature of the procedure, as well as the risks, benefits, and alternatives were carefully and thoroughly reviewed with the patient. Ample time for discussion and questions allowed. The patient understood, was satisfied, and agreed to proceed.     HPI: Courtney Rice is a 32 y.o. female who presents for colonoscopy for evaluation of constipation, change in bowel habits, bloating, lower abdominal pain.  Patient was most recently seen in the Gastroenterology Clinic on 09/29/2022 by me.  No interval change in medical history since that appointment. Please refer to that note for full details regarding GI history and clinical presentation.   Past Medical History:  Diagnosis Date   AICD (automatic cardioverter/defibrillator) present    Long Q-T syndrome Type 2    a. post partum syncope and VT PM;  b. 11/2013 s/p BSX SubQ ICD placement.   Migraine    Pre-syncope admitted 08/01/2015   a. felt to be 2/2 orthostatic hypotension - propranolol decreased.     Past Surgical History:  Procedure Laterality Date   IMPLANTABLE CARDIOVERTER DEFIBRILLATOR IMPLANT  12-20-2013   BSX SubQ ICD implanted by Dr Caryl Comes   IMPLANTABLE CARDIOVERTER DEFIBRILLATOR IMPLANT N/A 12/20/2013   Procedure: SUB Q ICD;  Surgeon: Deboraha Sprang, MD;  Location: [facility] CATH LAB;  Service: Cardiovascular;  Laterality: N/A;   SUBQ ICD CHANGEOUT N/A 10/31/2020   Procedure: SUBQ ICD CHANGEOUT;  Surgeon: Deboraha Sprang, MD;  Location: [facility] CV LAB;  Service: Cardiovascular;  Laterality: N/A;   TONSILLECTOMY AND ADENOIDECTOMY  2011    Prior to Admission medications   Medication Sig Start Date End Date Taking? Authorizing Provider   Atogepant (QULIPTA) 60 MG TABS Take 1 tablet (60 mg total) by mouth daily. Patient not taking: Reported on 09/29/2022 08/19/22   Janith Lima, MD  dicyclomine (BENTYL) 10 MG capsule Take 1 capsule (10 mg total) by mouth every 6 (six) hours as needed for spasms. 09/29/22   Kimberlin Scheel V, DO  DULoxetine (CYMBALTA) 30 MG capsule TAKE 1 CAPSULE(30 MG) BY MOUTH DAILY Patient not taking: Reported on 09/29/2022 09/17/22   Binnie Rail, MD  ibuprofen (ADVIL) 800 MG tablet Take 1 tablet (800 mg total) by mouth every 8 (eight) hours as needed. 11/05/21   Shelly Bombard, MD  traZODone (DESYREL) 50 MG tablet Take 1 tablet (50 mg total) by mouth at bedtime. Patient not taking: Reported on 09/29/2022 10/08/21   Janith Lima, MD  Ubrogepant (UBRELVY) 50 MG TABS Take 1 tablet by mouth as needed. 08/18/22   Janith Lima, MD    Current Outpatient Medications  Medication Sig Dispense Refill   Atogepant (QULIPTA) 60 MG TABS Take 1 tablet (60 mg total) by mouth daily. (Patient not taking: Reported on 09/29/2022) 90 tablet 1   dicyclomine (BENTYL) 10 MG capsule Take 1 capsule (10 mg total) by mouth every 6 (six) hours as needed for spasms. 30 capsule 3   DULoxetine (CYMBALTA) 30 MG capsule TAKE 1 CAPSULE(30 MG) BY MOUTH DAILY (Patient not taking: Reported on 09/29/2022) 30 capsule 0   ibuprofen (ADVIL) 800 MG tablet Take 1 tablet (800 mg total) by mouth every 8 (eight) hours as needed. 30 tablet 5  traZODone (DESYREL) 50 MG tablet Take 1 tablet (50 mg total) by mouth at bedtime. (Patient not taking: Reported on 09/29/2022) 90 tablet 0   Ubrogepant (UBRELVY) 50 MG TABS Take 1 tablet by mouth as needed. 6 tablet 2   Current Facility-Administered Medications  Medication Dose Route Frequency Provider Last Rate Last Admin   0.9 %  sodium chloride infusion  500 mL Intravenous Once Sherlene Rickel V, DO        Allergies as of 10/12/2022   (No Known Allergies)    Family History  Problem Relation Age of  Onset   Heart disease Mother    Diabetes Mother    Asthma Mother    COPD Mother    Lupus Mother    Stroke Mother    Hypertension Mother    Colon polyps Mother    Fibroids Mother    Kidney disease Mother    Fibromyalgia Mother    Sarcoidosis Mother    Cervical cancer Mother    Irritable bowel syndrome Mother    Liver disease Mother    Cystic fibrosis Mother    COPD Maternal Grandmother    Heart disease Maternal Grandmother    Breast cancer Maternal Aunt    Hearing loss Neg Hx    Heart attack Neg Hx     Social History   Socioeconomic History   Marital status: Single    Spouse name: Not on file   Number of children: 0   Years of education: Not on file   Highest education level: Not on file  Occupational History   Occupation: Optometrist  Tobacco Use   Smoking status: Never   Smokeless tobacco: Never  Vaping Use   Vaping Use: Never used  Substance and Sexual Activity   Alcohol use: No   Drug use: No   Sexual activity: Not Currently    Partners: Male    Birth control/protection: Condom  Other Topics Concern   Not on file  Social History Narrative   Not on file   Social Determinants of Health   Financial Resource Strain: Not on file  Food Insecurity: Not on file  Transportation Needs: Not on file  Physical Activity: Not on file  Stress: Not on file  Social Connections: Not on file  Intimate Partner Violence: Not on file    Physical Exam: Vital signs in last 24 hours: '@BP'$  (!) 117/58   Pulse 86   Temp 98.7 F (37.1 C)   Ht '5\' 4"'$  (1.626 m)   Wt 132 lb (59.9 kg)   LMP 09/26/2022   SpO2 99%   BMI 22.66 kg/m  GEN: NAD EYE: Sclerae anicteric ENT: MMM CV: Non-tachycardic Pulm: CTA b/l GI: Soft, NT/ND NEURO:  Alert & Oriented x 3   Gerrit Heck, DO [facility]   10/12/2022 11:39 AM\

## 2022-10-13 ENCOUNTER — Telehealth: Payer: Self-pay | Admitting: *Deleted

## 2022-10-13 NOTE — Telephone Encounter (Signed)
  Follow up Call-     10/12/2022   10:54 AM  Call back number  Post procedure Call Back phone  # [phone]  Permission to leave phone message Yes     Patient questions:  Do you have a fever, pain , or abdominal swelling? No. Pain Score  0 *  Have you tolerated food without any problems? Yes.    Have you been able to return to your normal activities? Yes.    Do you have any questions about your discharge instructions: Diet   No. Medications  No. Follow up visit  No.  Do you have questions or concerns about your Care? No.  Actions: * If pain score is 4 or above: No action needed, pain <4. Pt did complain of slight nausea this am. No fever,bleeding . Denies abdomen being tight or swollen and is passing flatus without any problems. Pt will call back if nausea does not improve today.

## 2022-10-15 ENCOUNTER — Telehealth: Payer: Self-pay | Admitting: Gastroenterology

## 2022-10-15 NOTE — Telephone Encounter (Signed)
Returned call to patient. Pt states that she is still having abdominal pain after eating, no matter how small the meal is. I asked patient if she tried the Dicyclomine as prescribed, and she has not picked up the prescription because her pharmacy was relocating. I offered to send RX to different pharmacy but patient stated that it was not necessary and she should be able to pick up original RX. I informed patient that she should try taking the Dicyclomine about 30 minutes before meals to see if that helps with the pain. Pt has been advised that we will be in touch with her pathology results once they have been reviewed. Pt verbalized understanding and had no concerns at the end of the call.

## 2022-10-15 NOTE — Telephone Encounter (Signed)
Inbound call from patient, states she is still experiencing nausea and cramping when she eats. Patient states symptoms have not improved since her procedure. Is requesting to speak with a nurse to advise further.

## 2022-10-21 NOTE — Telephone Encounter (Signed)
Patient reviewed MyChart message with pathology results and recommendations. Seen by patient Courtney Rice on 10/21/2022 12:21 PM

## 2022-10-21 NOTE — Telephone Encounter (Signed)
Patient is calling wishing to speak with the nurse regarding colonoscopy results. Please advise

## 2022-11-02 ENCOUNTER — Telehealth: Payer: Self-pay

## 2022-11-02 NOTE — Telephone Encounter (Signed)
Key: [id number]

## 2022-11-12 ENCOUNTER — Other Ambulatory Visit (HOSPITAL_COMMUNITY): Payer: Self-pay

## 2022-11-16 ENCOUNTER — Other Ambulatory Visit: Payer: Self-pay | Admitting: Internal Medicine

## 2022-11-16 DIAGNOSIS — G43709 Chronic migraine without aura, not intractable, without status migrainosus: Secondary | ICD-10-CM

## 2022-11-16 DIAGNOSIS — F321 Major depressive disorder, single episode, moderate: Secondary | ICD-10-CM

## 2022-12-16 ENCOUNTER — Telehealth: Payer: Self-pay | Admitting: Internal Medicine

## 2022-12-16 NOTE — Telephone Encounter (Signed)
Pt c/o of Chest Pain: STAT if CP now or developed within 24 hours  1. Are you having CP right now? No  2. Are you experiencing any other symptoms (ex. SOB, nausea, vomiting, sweating)? Hands are shaky, dizziness, a little SOB, and feels like she could pass out but does not.  3. How long have you been experiencing CP? A couple of months  4. Is your CP continuous or coming and going? Coming and going   5. Have you taken Nitroglycerin? No ?  Patient stated she has been waiting to call about experiencing chest pains, but yesterday she had an episode that felt worse than what it has been. Patient stated that yesterday she felt her hands become shaky, felt dizzy, and felt like she could pass out. Patient stated that normally when she has these chest pains, she does not feel all of these symptoms at once. Patient stated that sometimes she will feel a little SOB as well. Please advise.

## 2022-12-16 NOTE — Telephone Encounter (Signed)
Spoke to the patient, she has been experiencing   chest pain off and on for a month. Pt complained of randomly experiencing shortness of breath, there are time pt stated she will wake up gasping for air this is becoming more frequent. On yesterday pt mentioned she experienced mild shortness of breath, both hands were shaking, and mild dizziness. Pt is currently symptomatic, scheduled with MD on 5/23, explained ED precautions pt voiced understanding. Will forward to MD and nurse.

## 2022-12-16 NOTE — Telephone Encounter (Signed)
Message received from Ms. Oneca RN from [employer].  Pt called to follow up on symptoms, and check on Pt condition.  Pt states she is currently asymptomatic, but had some episodes of shortness of breath, dizziness, and shaking as indicated in Ms. Oneca's note.    Pt advised that we can check her device to see if her symptoms connect to any arrhythmias captured, and that I would forward this message to Device Triage for an assessment.  Pt also advised to call [facility] Triage if symptoms return / worsen, and shared our business hours.   If this occurs after hours, to go to the nearest ER for providers care.  Pt verbalized understanding.

## 2022-12-17 NOTE — Telephone Encounter (Signed)
Attempted to assist patient with sending remote transmission. Remote transmission shows normal device function and no episodes logged. Patient advised and encouraged to seek ER if needed due to previous symptoms reoccurring. Patient also encouraged to attend upcoming apt with Dr. Ladona Ridgel. Patient voiced understanding.

## 2022-12-20 ENCOUNTER — Ambulatory Visit (INDEPENDENT_AMBULATORY_CARE_PROVIDER_SITE_OTHER): Payer: BC Managed Care – PPO

## 2022-12-20 DIAGNOSIS — I4581 Long QT syndrome: Secondary | ICD-10-CM | POA: Diagnosis not present

## 2022-12-21 ENCOUNTER — Other Ambulatory Visit: Payer: Self-pay | Admitting: *Deleted

## 2022-12-21 LAB — CUP PACEART REMOTE DEVICE CHECK
Battery Remaining Percentage: 78 %
Date Time Interrogation Session: 20240517164100
Implantable Lead Connection Status: 753985
Implantable Lead Implant Date: 20150521
Implantable Lead Location: 753862
Implantable Lead Model: 3400
Implantable Pulse Generator Implant Date: 20220401
Pulse Gen Serial Number: 157706

## 2022-12-21 NOTE — Progress Notes (Signed)
RX Ibuprofen refilled.

## 2022-12-23 ENCOUNTER — Encounter: Payer: Self-pay | Admitting: Internal Medicine

## 2022-12-23 ENCOUNTER — Ambulatory Visit: Payer: BC Managed Care – PPO | Attending: Internal Medicine | Admitting: Internal Medicine

## 2022-12-23 VITALS — BP 118/72 | Ht 64.0 in | Wt 131.6 lb

## 2022-12-23 DIAGNOSIS — I4581 Long QT syndrome: Secondary | ICD-10-CM

## 2022-12-23 DIAGNOSIS — R42 Dizziness and giddiness: Secondary | ICD-10-CM | POA: Diagnosis not present

## 2022-12-23 DIAGNOSIS — Z95810 Presence of automatic (implantable) cardiac defibrillator: Secondary | ICD-10-CM

## 2022-12-23 LAB — CUP PACEART INCLINIC DEVICE CHECK
Date Time Interrogation Session: 20240523095641
Implantable Lead Connection Status: 753985
Implantable Lead Implant Date: 20150521
Implantable Lead Location: 753862
Implantable Lead Model: 3400
Implantable Pulse Generator Implant Date: 20220401
Pulse Gen Serial Number: 157706

## 2022-12-23 MED ORDER — BISOPROLOL FUMARATE 5 MG PO TABS: 2.5000 mg | ORAL_TABLET | Freq: Every day | ORAL | 1 refills | Status: DC

## 2022-12-23 NOTE — Patient Instructions (Addendum)
Medication Instructions:  Your physician has recommended you make the following change in your medication: START TAKING: Bisoprolol 2.5 mg  Bisoprolol 2.5 mg / Zebeta   You will-   Take 0.5 tablets (2.5 mg total) by mouth daily    Lab Work: None ordered.  If you have labs (blood work) drawn today and your tests are completely normal, you will receive your results only by: MyChart Message (if you have MyChart) OR A paper copy in the mail If you have any lab test that is abnormal or we need to change your treatment, we will call you to review the results.  Testing/Procedures: None ordered.  Follow-Up: At [facility], you and your health needs are our priority.  As part of our continuing mission to provide you with exceptional heart care, we have created designated Provider Care Teams.  These Care Teams include your primary Cardiologist (physician) and Advanced Practice Providers (APPs -  Physician Assistants and Nurse Practitioners) who all work together to provide you with the care you need, when you need it.   Your next appointment:   You will follow up in 1 year with Dr. Sherryl Manges, per Dr. Lewayne Bunting  The format for your next appointment:   In Person  Provider:   Lewayne Bunting, MD{or one of the following Advanced Practice Providers on your designated Care Team:   Francis Dowse, [place] Casimiro Needle "Mardelle Matte" Lanna Poche, [place]  Remote monitoring is used to monitor your ICD from home. This monitoring reduces the number of office visits required to check your device to one time per year. It allows [place] to keep an eye on the functioning of your device to ensure it is working properly. You are scheduled for a device check from home on 8/19. You may send your transmission at any time that day. If you have a wireless device, the transmission will be sent automatically. After your physician reviews your transmission, you will receive a postcard with your next transmission date.  Bisoprolol  Tablets What is this medication? BISOPROLOL (bis [place] proe lol) treats high blood pressure. It works by lowering your blood pressure and heart rate, making it easier for your heart to pump blood to the rest of your body. It belongs to a group of medications called beta blockers. This medicine may be used for other purposes; ask your health care provider or pharmacist if you have questions. COMMON BRAND NAME(S): Zebeta What should I tell my care team before I take this medication? They need to know if you have any of these conditions: Chest pain Diabetes Heart or vessel disease, such as slow heart rate, worsening heart failure, heart block, sick sinus syndrome, or Raynaud syndrome Kidney disease Liver disease Lung or breathing disease, such as asthma or emphysema Pheochromocytoma Thyroid disease An unusual or allergic reaction to bisoprolol, other medications, foods, dyes, or preservatives Pregnant or trying to get pregnant Breastfeeding How should I use this medication? Take this medication by mouth. Take it as directed on the prescription label at the same time every day. You can take it with or without food. If it upsets your stomach, take it with food. Keep taking it unless your care team tells you to stop. Talk to your care team about the use of this medication in children. Special care may be needed. Overdosage: If you think you have taken too much of this medicine contact a poison control center or emergency room at once. NOTE: This medicine is only for you. Do not share  this medicine with others. What if I miss a dose? If you miss a dose, take it as soon as you can. If it is almost time for your next dose, take only that dose. Do not take double or extra doses. What may interact with this medication? This medication may interact with the following: Certain medications for blood pressure, heart disease, irregular heartbeat NSAIDs, medications for pain and inflammation, like ibuprofen  or naproxen Rifampin This list may not describe all possible interactions. Give your health care provider a list of all the medicines, herbs, non-prescription drugs, or dietary supplements you use. Also tell them if you smoke, drink alcohol, or use illegal drugs. Some items may interact with your medicine. What should I watch for while using this medication? Visit your care team for regular checks on your progress. Check your blood pressure as directed. Know what your blood pressure should be and when to contact your care team. Do not treat yourself for coughs, colds, or pain while you are using this medication without asking your care team for advice. Some medications may increase your blood pressure. This medication may affect your coordination, reaction time, or judgment. Do not drive or operate machinery until you know how this medication affects you. Sit up or stand slowly to reduce the risk of dizzy or fainting spells. Drinking alcohol with this medication can increase the risk of these side effects. This medication may increase blood sugar. Ask your care team if changes in diet or medications are needed if you have diabetes. What side effects may I notice from receiving this medication? Side effects that you should report to your care team as soon as possible: Allergic reactions--skin rash, itching, hives, swelling of the face, lips, tongue, or throat Heart failure--shortness of breath, swelling of the ankles, feet, or hands, sudden weight gain, unusual weakness or fatigue Low blood pressure--dizziness, feeling faint or lightheaded, blurry vision Raynaud's--cool, numb, or painful fingers or toes that may change color from pale, to blue, to red Slow heartbeat--dizziness, feeling faint or lightheaded, confusion, trouble breathing, unusual weakness or fatigue Worsening mood, feelings of depression Side effects that usually do not require medical attention (report to your care team if they  continue or are bothersome): Change in sex drive or performance Diarrhea Dizziness Fatigue Headache This list may not describe all possible side effects. Call your doctor for medical advice about side effects. You may report side effects to FDA at 1-800-FDA-1088. Where should I keep my medication? Keep out of the reach of children and pets. Store at room temperature between 20 and 25 degrees C (68 and 77 degrees F). Protect from light and moisture. Keep the container tightly closed. Throw away any unused medication after the expiration date. NOTE: This sheet is a summary. It may not cover all possible information. If you have questions about this medicine, talk to your doctor, pharmacist, or health care provider.  2023 Elsevier/Gold Standard (2020-09-12 00:00:00)

## 2022-12-23 NOTE — Progress Notes (Signed)
HPI [name] returns today for followup. She is a pleasant 32 yo woman with congenital long QT who had recurrent syncope and PMVT/TDP during pregnancy and underwent S-ICD insertion. She has not been on her beta blocker but has not had any ICD therapies. She has underwent gen change out 2 years ago. She has not had syncope. Her mother and grandmother are patients of mine, both with ICD's and episodes of VF with successful ICD therapies. She has started back exercising but not too vigorously.  No Known Allergies   Current Outpatient Medications  Medication Sig Dispense Refill   Atogepant (QULIPTA) 60 MG TABS Take 1 tablet (60 mg total) by mouth daily. 90 tablet 1   bisoprolol (ZEBETA) 5 MG tablet Take 0.5 tablets (2.5 mg total) by mouth daily. 90 tablet 1   dicyclomine (BENTYL) 10 MG capsule Take 1 capsule (10 mg total) by mouth every 6 (six) hours as needed for spasms. 30 capsule 3   DULoxetine (CYMBALTA) 30 MG capsule TAKE 1 CAPSULE(30 MG) BY MOUTH DAILY 30 capsule 0   ibuprofen (ADVIL) 800 MG tablet Take 1 tablet (800 mg total) by mouth every 8 (eight) hours as needed. 30 tablet 5   traZODone (DESYREL) 50 MG tablet TAKE 1 TABLET(50 MG) BY MOUTH AT BEDTIME 90 tablet 0   Ubrogepant (UBRELVY) 50 MG TABS Take 1 tablet by mouth as needed. 6 tablet 2   No current facility-administered medications for this visit.     Past Medical History:  Diagnosis Date   AICD (automatic cardioverter/defibrillator) present    Long Q-T syndrome Type 2    a. post partum syncope and VT PM;  b. 11/2013 s/p BSX SubQ ICD placement.   Migraine    Pre-syncope admitted 08/01/2015   a. felt to be 2/2 orthostatic hypotension - propranolol decreased.     ROS:   All systems reviewed and negative except as noted in the HPI.   Past Surgical History:  Procedure Laterality Date   IMPLANTABLE CARDIOVERTER DEFIBRILLATOR IMPLANT  12-20-2013   BSX SubQ ICD implanted by Dr Graciela Husbands   IMPLANTABLE CARDIOVERTER  DEFIBRILLATOR IMPLANT N/A 12/20/2013   Procedure: SUB Q ICD;  Surgeon: Duke Salvia, MD;  Location: [facility] CATH LAB;  Service: Cardiovascular;  Laterality: N/A;   SUBQ ICD CHANGEOUT N/A 10/31/2020   Procedure: SUBQ ICD CHANGEOUT;  Surgeon: Duke Salvia, MD;  Location: [facility] INVASIVE CV LAB;  Service: Cardiovascular;  Laterality: N/A;   TONSILLECTOMY AND ADENOIDECTOMY  2011     Family History  Problem Relation Age of Onset   Heart disease Mother    Diabetes Mother    Asthma Mother    COPD Mother    Lupus Mother    Stroke Mother    Hypertension Mother    Colon polyps Mother    Fibroids Mother    Kidney disease Mother    Fibromyalgia Mother    Sarcoidosis Mother    Cervical cancer Mother    Irritable bowel syndrome Mother    Liver disease Mother    Cystic fibrosis Mother    COPD Maternal Grandmother    Heart disease Maternal Grandmother    Breast cancer Maternal Aunt    Hearing loss Neg Hx    Heart attack Neg Hx      Social History   Socioeconomic History   Marital status: Single    Spouse name: Not on file   Number of children: 0   Years of education: Not  on file   Highest education level: Not on file  Occupational History   Occupation: Accountant  Tobacco Use   Smoking status: Never   Smokeless tobacco: Never  Vaping Use   Vaping Use: Never used  Substance and Sexual Activity   Alcohol use: No   Drug use: No   Sexual activity: Not Currently    Partners: Male    Birth control/protection: Condom  Other Topics Concern   Not on file  Social History Narrative   Not on file   Social Determinants of Health   Financial Resource Strain: Not on file  Food Insecurity: Not on file  Transportation Needs: Not on file  Physical Activity: Not on file  Stress: Not on file  Social Connections: Not on file  Intimate Partner Violence: Not on file     BP 118/72   Ht 5\' 4"  (1.626 m)   Wt 131 lb 9.6 oz (59.7 kg)   SpO2 99%   BMI 22.59 kg/m   Physical Exam:  Well  appearing NAD HEENT: Unremarkable Neck:  No JVD, no thyromegally Lymphatics:  No adenopathy Back:  No CVA tenderness Lungs:  Clear with no wheezes HEART:  Regular rate rhythm, no murmurs, no rubs, no clicks Abd:  soft, positive bowel sounds, no organomegally, no rebound, no guarding Ext:  2 plus pulses, no edema, no cyanosis, no clubbing Skin:  No rashes no nodules Neuro:  CN II through XII intact, motor grossly intact  EKG - nsr with QT prolongation  DEVICE  Normal device function.  See PaceArt for details.   Assess/Plan: 1. Congenital long QT - she has not had any additional arrhythmias. I encouraged her to restart a beta blocker. Bisoprolol 2.5 mg daily. 2. ICD - Her [place] S-ICD is working normally.   Courtney Gowda Iktan Aikman,MD

## 2023-01-17 NOTE — Progress Notes (Signed)
Remote ICD transmission.   

## 2023-02-02 ENCOUNTER — Other Ambulatory Visit: Payer: Self-pay | Admitting: Gastroenterology

## 2023-02-08 ENCOUNTER — Encounter: Payer: Self-pay | Admitting: Gastroenterology

## 2023-03-08 ENCOUNTER — Ambulatory Visit (INDEPENDENT_AMBULATORY_CARE_PROVIDER_SITE_OTHER): Payer: BC Managed Care – PPO | Admitting: Gastroenterology

## 2023-03-08 ENCOUNTER — Encounter: Payer: Self-pay | Admitting: Gastroenterology

## 2023-03-08 VITALS — BP 100/72 | HR 66 | Ht 64.0 in | Wt 125.0 lb

## 2023-03-08 DIAGNOSIS — R1030 Lower abdominal pain, unspecified: Secondary | ICD-10-CM

## 2023-03-08 DIAGNOSIS — K5900 Constipation, unspecified: Secondary | ICD-10-CM | POA: Diagnosis not present

## 2023-03-08 MED ORDER — LINACLOTIDE 145 MCG PO CAPS: 145.0000 ug | ORAL_CAPSULE | Freq: Every day | ORAL | 3 refills | Status: DC

## 2023-03-08 NOTE — Patient Instructions (Signed)
   We have sent the following medications to your pharmacy for you to pick up at your convenience: Linzess 145 mcg  _______________________________________________________  If your blood pressure at your visit was 140/90 or greater, please contact your primary care physician to follow up on this.  _______________________________________________________  If you are age 31 or older, your body mass index should be between 23-30. Your Body mass index is 21.46 kg/m. If this is out of the aforementioned range listed, please consider follow up with your Primary Care Provider.  If you are age 12 or younger, your body mass index should be between 19-25. Your Body mass index is 21.46 kg/m. If this is out of the aformentioned range listed, please consider follow up with your Primary Care Provider.   ________________________________________________________  The [place] [facility] providers would like to encourage you to use [facility] to communicate with providers for non-urgent requests or questions.  Due to long hold times on the telephone, sending your provider a message by [facility] may be a faster and more efficient way to get a response.  Please allow 48 business hours for a response.  Please remember that this is for non-urgent requests.  _______________________________________________________   I appreciate the  opportunity to care for you  Thank You   [facility]

## 2023-03-08 NOTE — Progress Notes (Signed)
Chief Complaint: Follow up Primary GI MD: Dr. Barron Alvine  HPI: 32 year old female history of long QT syndrome s/p AICD placement 2015, constipation, presents for follow-up.  Last seen 09/2022 by Dr. Barron Alvine.  At that time she was reporting constipation and abdominal pain ongoing for 2 to 3 years.  Patient underwent colonoscopy which showed torturous colon, 2 tubular adenomas 3 to 4 mm in size.,  Repeat 7 years.  Patient was put on Bentyl.  She reports some improvement using Bentyl with eating but states she still struggles with ranch and beef.  Pork is not an issue for her.  Still having constipation where she can go 5 to 7 days without a bowel movement.  Has tried MiraLAX in the past but not consistently.  Has not tried any prescription strength constipation medications.  Denies melena/hematochezia.   Past Medical History:  Diagnosis Date   AICD (automatic cardioverter/defibrillator) present    Long Q-T syndrome Type 2    a. post partum syncope and VT PM;  b. 11/2013 s/p BSX SubQ ICD placement.   Migraine    Pre-syncope admitted 08/01/2015   a. felt to be 2/2 orthostatic hypotension - propranolol decreased.     Past Surgical History:  Procedure Laterality Date   IMPLANTABLE CARDIOVERTER DEFIBRILLATOR IMPLANT  12-20-2013   BSX SubQ ICD implanted by Dr Graciela Husbands   IMPLANTABLE CARDIOVERTER DEFIBRILLATOR IMPLANT N/A 12/20/2013   Procedure: SUB Q ICD;  Surgeon: Duke Salvia, MD;  Location: [facility] CATH LAB;  Service: Cardiovascular;  Laterality: N/A;   SUBQ ICD CHANGEOUT N/A 10/31/2020   Procedure: SUBQ ICD CHANGEOUT;  Surgeon: Duke Salvia, MD;  Location: [facility] INVASIVE CV LAB;  Service: Cardiovascular;  Laterality: N/A;   TONSILLECTOMY AND ADENOIDECTOMY  2011    Current Outpatient Medications  Medication Sig Dispense Refill   Atogepant (QULIPTA) 60 MG TABS Take 1 tablet (60 mg total) by mouth daily. 90 tablet 1   bisoprolol (ZEBETA) 5 MG tablet Take 0.5 tablets (2.5 mg total) by mouth  daily. 90 tablet 1   dicyclomine (BENTYL) 10 MG capsule TAKE 1 CAPSULE(10 MG) BY MOUTH EVERY 6 HOURS AS NEEDED FOR SPASMS 30 capsule 3   DULoxetine (CYMBALTA) 30 MG capsule TAKE 1 CAPSULE(30 MG) BY MOUTH DAILY 30 capsule 0   ibuprofen (ADVIL) 800 MG tablet Take 1 tablet (800 mg total) by mouth every 8 (eight) hours as needed. 30 tablet 5   traZODone (DESYREL) 50 MG tablet TAKE 1 TABLET(50 MG) BY MOUTH AT BEDTIME 90 tablet 0   Ubrogepant (UBRELVY) 50 MG TABS Take 1 tablet by mouth as needed. 6 tablet 2   No current facility-administered medications for this visit.    Allergies as of 03/08/2023   (No Known Allergies)    Family History  Problem Relation Age of Onset   Heart disease Mother    Diabetes Mother    Asthma Mother    COPD Mother    Lupus Mother    Stroke Mother    Hypertension Mother    Colon polyps Mother    Fibroids Mother    Kidney disease Mother    Fibromyalgia Mother    Sarcoidosis Mother    Cervical cancer Mother    Irritable bowel syndrome Mother    Liver disease Mother    Cystic fibrosis Mother    COPD Maternal Grandmother    Heart disease Maternal Grandmother    Breast cancer Maternal Aunt    Hearing loss Neg Hx    Heart attack  Neg Hx     Social History   Socioeconomic History   Marital status: Single    Spouse name: Not on file   Number of children: 0   Years of education: Not on file   Highest education level: Not on file  Occupational History   Occupation: Accountant  Tobacco Use   Smoking status: Never   Smokeless tobacco: Never  Vaping Use   Vaping status: Never Used  Substance and Sexual Activity   Alcohol use: No   Drug use: No   Sexual activity: Not Currently    Partners: Male    Birth control/protection: Condom  Other Topics Concern   Not on file  Social History Narrative   Not on file   Social Determinants of Health   Financial Resource Strain: Not on file  Food Insecurity: Not on file  Transportation Needs: Not on file   Physical Activity: Not on file  Stress: Not on file  Social Connections: Unknown (11/30/2021)   [facility]   Social Network    Social Network: Not on file  Intimate Partner Violence: Unknown (11/03/2021)   Received from [facility]   HITS    Physically Hurt: Not on file    Insult or Talk Down To: Not on file    Threaten Physical Harm: Not on file    Scream or Curse: Not on file    Review of Systems:    Constitutional: No weight loss, fever, chills, weakness or fatigue HEENT: Eyes: No change in vision               Ears, Nose, Throat:  No change in hearing or congestion Skin: No rash or itching Cardiovascular: No chest pain, chest pressure or palpitations   Respiratory: No SOB or cough Gastrointestinal: See HPI and otherwise negative Genitourinary: No dysuria or change in urinary frequency Neurological: No headache, dizziness or syncope Musculoskeletal: No new muscle or joint pain Hematologic: No bleeding or bruising Psychiatric: No history of depression or anxiety    Physical Exam:  Vital signs: There were no vitals taken for this visit.  Constitutional: NAD, Well developed, Well nourished, alert and cooperative Head:  Normocephalic and atraumatic. Eyes:   PEERL, EOMI. No icterus. Conjunctiva pink. Respiratory: Respirations even and unlabored. Lungs clear to auscultation bilaterally.   No wheezes, crackles, or rhonchi.  Cardiovascular:  Regular rate and rhythm. No peripheral edema, cyanosis or pallor.  Gastrointestinal:  Soft, nondistended, nontender. No rebound or guarding. Normal bowel sounds. No appreciable masses or hepatomegaly. Rectal:  Not performed.  Msk:  Symmetrical without gross deformities. Without edema, no deformity or joint abnormality.  Neurologic:  Alert and  oriented x4;  grossly normal neurologically.  Skin:   Dry and intact without significant lesions or rashes. Psychiatric: Oriented to person, place and time. Demonstrates good  judgement and reason without abnormal affect or behaviors.   RELEVANT LABS AND IMAGING: CBC    Component Value Date/Time   WBC 4.5 08/18/2022 1103   RBC 4.45 08/18/2022 1103   HGB 14.1 08/18/2022 1103   HGB 14.2 10/02/2020 1322   HCT 40.3 08/18/2022 1103   HCT 40.6 10/02/2020 1322   PLT 215.0 08/18/2022 1103   PLT 228 10/02/2020 1322   MCV 90.6 08/18/2022 1103   MCV 90 10/02/2020 1322   MCH 31.6 10/02/2020 1322   MCH 31.2 08/16/2015 0910   MCHC 35.0 08/18/2022 1103   RDW 12.6 08/18/2022 1103   RDW 12.1 10/02/2020 1322   LYMPHSABS  2.8 08/18/2022 1103   LYMPHSABS 2.6 10/02/2020 1322   MONOABS 0.5 08/18/2022 1103   EOSABS 0.1 08/18/2022 1103   EOSABS 0.1 10/02/2020 1322   BASOSABS 0.0 08/18/2022 1103   BASOSABS 0.1 10/02/2020 1322    CMP     Component Value Date/Time   NA 138 08/18/2022 1103   NA 141 10/02/2020 1322   K 4.0 08/18/2022 1103   CL 104 08/18/2022 1103   CO2 30 08/18/2022 1103   GLUCOSE 75 08/18/2022 1103   BUN 7 08/18/2022 1103   BUN 7 10/02/2020 1322   CREATININE 0.69 08/18/2022 1103   CALCIUM 9.5 08/18/2022 1103   PROT 7.4 08/18/2022 1103   ALBUMIN 4.5 08/18/2022 1103   AST 20 08/18/2022 1103   ALT 16 08/18/2022 1103   ALKPHOS 46 08/18/2022 1103   BILITOT 0.4 08/18/2022 1103   GFRNONAA 119 05/26/2017 0952   GFRAA 137 05/26/2017 0952     Assessment/Plan:   Constipation, unspecified constipation type Lower abdominal pain Longstanding history of constipation with a recent colonoscopy that was unrevealing.  Patient did not undergo pelvic floor PT as previously recommended by Dr. Barron Alvine for suspicion of pelvic floor dyssynergia. Patient reports she would like to try prescription medication as she feels miralax didn't work well for her. --- Linzess --- educated patient on following a low FODMAP diet as certain foods such as broccoli "disagree" with her --- let us know progress with Linzess. If no improvement can increase/decrease strength  or try amitiza or trulance.   Lara Mulch  Gastroenterology 03/08/2023, 11:08 AM  Cc: Etta Grandchild, MD

## 2023-03-14 ENCOUNTER — Telehealth: Payer: Self-pay | Admitting: Gastroenterology

## 2023-03-14 NOTE — Telephone Encounter (Signed)
Inbound call from patient wanting to inform that linzess has not been working for her. Requesting a call back to discuss other options. Please advise, thank you.

## 2023-03-14 NOTE — Telephone Encounter (Signed)
Patient states that she took one dose of Linzess 145 mcg last night and it was ineffective. Patient says this is the only time she has taken the rx. I advised that she continue Linzess 145 mcg for at least 1 week and let us know if it continues to be ineffective at which time we may look at adjusting her dose. Patient verbalizes understanding.

## 2023-03-15 ENCOUNTER — Encounter: Payer: Self-pay | Admitting: Obstetrics

## 2023-03-16 ENCOUNTER — Other Ambulatory Visit: Payer: Self-pay | Admitting: Emergency Medicine

## 2023-03-16 DIAGNOSIS — N946 Dysmenorrhea, unspecified: Secondary | ICD-10-CM

## 2023-03-16 MED ORDER — IBUPROFEN 800 MG PO TABS
800.0000 mg | ORAL_TABLET | Freq: Three times a day (TID) | ORAL | 0 refills | Status: DC | PRN
Start: 2023-03-16 — End: 2023-05-19

## 2023-03-16 NOTE — Progress Notes (Signed)
Rx refill for ibuprofen

## 2023-04-14 NOTE — Progress Notes (Signed)
Agree with the assessment and plan as outlined by Cira Servant, PA-C.  Reasonable to trial course of Linzess, along with increasing hydration with a goal of at least 64 ounces of water daily.  Agree with low FODMAP diet for diagnostic and therapeutic intent to try to pinpoint certain culprit foods.  If no response to Linzess, again recommend referral for floor PT.  Alternatively, could send for ARM and/or sitz marker study.  Rashika Bettes, DO, [facility]

## 2023-05-05 ENCOUNTER — Other Ambulatory Visit (HOSPITAL_COMMUNITY): Payer: Self-pay

## 2023-05-19 ENCOUNTER — Other Ambulatory Visit: Payer: Self-pay | Admitting: Obstetrics and Gynecology

## 2023-05-19 DIAGNOSIS — N946 Dysmenorrhea, unspecified: Secondary | ICD-10-CM

## 2023-05-19 MED ORDER — IBUPROFEN 800 MG PO TABS
800.0000 mg | ORAL_TABLET | Freq: Three times a day (TID) | ORAL | 0 refills | Status: DC | PRN
Start: 2023-05-19 — End: 2023-08-07

## 2023-05-20 ENCOUNTER — Telehealth: Payer: Self-pay | Admitting: Pharmacy Technician

## 2023-05-20 ENCOUNTER — Other Ambulatory Visit (HOSPITAL_COMMUNITY): Payer: Self-pay

## 2023-05-20 NOTE — Telephone Encounter (Signed)
Pharmacy Patient Advocate Encounter   Received notification from CoverMyMeds that prior authorization for Ubrelvy 50MG  tablets is required/requested.   Insurance verification completed.   The patient is insured through [facility] .   Per test claim: PA required; PA submitted to [employer] via CoverMyMeds Key/confirmation #/EOC [id number] Status is pending

## 2023-06-02 ENCOUNTER — Telehealth: Payer: Self-pay

## 2023-06-02 NOTE — Telephone Encounter (Signed)
Denial information in chart

## 2023-06-15 ENCOUNTER — Encounter: Payer: Self-pay | Admitting: Gastroenterology

## 2023-06-15 ENCOUNTER — Ambulatory Visit (INDEPENDENT_AMBULATORY_CARE_PROVIDER_SITE_OTHER): Payer: BC Managed Care – PPO | Admitting: Gastroenterology

## 2023-06-15 VITALS — BP 104/72 | HR 78 | Ht 64.0 in | Wt 127.1 lb

## 2023-06-15 DIAGNOSIS — K5900 Constipation, unspecified: Secondary | ICD-10-CM | POA: Insufficient documentation

## 2023-06-15 MED ORDER — DICYCLOMINE HCL 10 MG PO CAPS: 10.0000 mg | ORAL_CAPSULE | Freq: Four times a day (QID) | ORAL | 3 refills | Status: AC | PRN

## 2023-06-15 NOTE — Patient Instructions (Signed)
We have sent the following medications to your pharmacy for you to pick up at your convenience: Dicyclomine.   We have given you samples of the following medication to take: Linzess 290 mcg daily before breakfast.   Call or send Mychart with an update in 10-14 days.    _______________________________________________________  If your blood pressure at your visit was 140/90 or greater, please contact your primary care physician to follow up on this.  _______________________________________________________  If you are age 31 or older, your body mass index should be between 23-30. Your Body mass index is 21.82 kg/m. If this is out of the aforementioned range listed, please consider follow up with your Primary Care Provider.  If you are age 77 or younger, your body mass index should be between 19-25. Your Body mass index is 21.82 kg/m. If this is out of the aformentioned range listed, please consider follow up with your Primary Care Provider.   ________________________________________________________  The [place] [facility] providers would like to encourage you to use [facility] to communicate with providers for non-urgent requests or questions.  Due to long hold times on the telephone, sending your provider a message by [facility] may be a faster and more efficient way to get a response.  Please allow 48 business hours for a response.  Please remember that this is for non-urgent requests.  _______________________________________________________

## 2023-06-15 NOTE — Progress Notes (Signed)
06/15/2023 Courtney Rice [id number] 1990-09-05   HISTORY OF PRESENT ILLNESS: This is a 32 year old female with history of long QT syndrome status post AICD placement in 2015 and chronic constipation.  Colonoscopy March 2024 showed tortuous colon, 2 tubular adenomas that were 3 to 4 mm in size with the recommended repeat in 7 years.  Has complaints of constipation and abdominal pain.  Is using Bentyl.  Is on Linzess 145 mcg daily.  Says that she felt like at first Linzess did not work at all and then she was having daily bowel movements and then she feels like it stopped working as well again.  Says that she really tries to limit her diet, but has not tried the FODMAP diet.  Previously declined pelvic floor physical therapy.  She tells me again today that she would not be able to get the time off work to do that.  Bentyl does help her be able to eat without pain, etc.  Needs that refilled.  TSH previously normal.  Past Medical History:  Diagnosis Date   AICD (automatic cardioverter/defibrillator) present    Long Q-T syndrome Type 2    a. post partum syncope and VT PM;  b. 11/2013 s/p BSX SubQ ICD placement.   Migraine    Pre-syncope admitted 08/01/2015   a. felt to be 2/2 orthostatic hypotension - propranolol decreased.    Past Surgical History:  Procedure Laterality Date   IMPLANTABLE CARDIOVERTER DEFIBRILLATOR IMPLANT  12-20-2013   BSX SubQ ICD implanted by Dr Graciela Husbands   IMPLANTABLE CARDIOVERTER DEFIBRILLATOR IMPLANT N/A 12/20/2013   Procedure: SUB Q ICD;  Surgeon: Duke Salvia, MD;  Location: [facility] CATH LAB;  Service: Cardiovascular;  Laterality: N/A;   SUBQ ICD CHANGEOUT N/A 10/31/2020   Procedure: SUBQ ICD CHANGEOUT;  Surgeon: Duke Salvia, MD;  Location: [facility] INVASIVE CV LAB;  Service: Cardiovascular;  Laterality: N/A;   TONSILLECTOMY AND ADENOIDECTOMY  2011    reports that she has never smoked. She has never used smokeless tobacco. She reports that she does not drink alcohol and  does not use drugs. family history includes Asthma in her mother; Breast cancer in her maternal aunt; COPD in her maternal grandmother and mother; Cervical cancer in her mother; Colon polyps in her mother; Cystic fibrosis in her mother; Diabetes in her mother; Fibroids in her mother; Fibromyalgia in her mother; Heart disease in her maternal grandmother and mother; Hypertension in her mother; Irritable bowel syndrome in her mother; Kidney disease in her mother; Liver disease in her mother; Long QT syndrome in her son; Lupus in her mother; Sarcoidosis in her mother; Stroke in her mother. No Known Allergies    Outpatient Encounter Medications as of 06/15/2023  Medication Sig   dicyclomine (BENTYL) 10 MG capsule TAKE 1 CAPSULE(10 MG) BY MOUTH EVERY 6 HOURS AS NEEDED FOR SPASMS   ibuprofen (ADVIL) 800 MG tablet Take 1 tablet (800 mg total) by mouth every 8 (eight) hours as needed.   linaclotide (LINZESS) 145 MCG CAPS capsule Take 1 capsule (145 mcg total) by mouth daily before breakfast.   Vitamins A & D (VITAMIN A & D) 10000-400 units TABS Take 1 tablet by mouth daily.   Atogepant (QULIPTA) 60 MG TABS Take 1 tablet (60 mg total) by mouth daily. (Patient not taking: Reported on 03/08/2023)   bisoprolol (ZEBETA) 5 MG tablet Take 0.5 tablets (2.5 mg total) by mouth daily. (Patient not taking: Reported on 03/08/2023)   DULoxetine (CYMBALTA) 30 MG capsule  TAKE 1 CAPSULE(30 MG) BY MOUTH DAILY (Patient not taking: Reported on 03/08/2023)   traZODone (DESYREL) 50 MG tablet TAKE 1 TABLET(50 MG) BY MOUTH AT BEDTIME (Patient not taking: Reported on 03/08/2023)   Ubrogepant (UBRELVY) 50 MG TABS Take 1 tablet by mouth as needed. (Patient not taking: Reported on 06/15/2023)   No facility-administered encounter medications on file as of 06/15/2023.    REVIEW OF SYSTEMS  : All other systems reviewed and negative except where noted in the History of Present Illness.   PHYSICAL EXAM: BP 104/72   Pulse 78   Ht 5\' 4"   (1.626 m)   Wt 127 lb 2 oz (57.7 kg)   BMI 21.82 kg/m  General: Well developed female in no acute distress Head: Normocephalic and atraumatic Eyes:  Sclerae anicteric, conjunctiva pink. Ears: Normal auditory acuity Lungs: Clear throughout to auscultation Heart: Regular rate and rhythm Abdomen: Soft, non-distended.  BS present.  Non-tender. Musculoskeletal: Symmetrical with no gross deformities  Skin: No lesions on visible extremities Extremities: No edema  Neurological: Alert oriented x 4, grossly non-focal Psychological:  Alert and cooperative. Normal mood and affect  ASSESSMENT AND PLAN: 31 year old female with constipation and lower abdominal pain, suspect IBS-C.  Is using Linzess 145 mcg daily and Bentyl as needed, but uses it most days.  Says that she really limits her diet, but has not followed FODMAP diet.  Declined pelvic floor physical therapy and continues to decline that today.  Tells me that she would not be able to get time approved off of work.  Will increase Linzess to 290 mcg daily.  Samples given.  She can also double and use up what she has at home.  Will refill Bentyl as well, prescription sent to pharmacy.  She will send [place] a message with an update in 10 to 14 days.   CC:  Etta Grandchild, MD

## 2023-06-20 ENCOUNTER — Ambulatory Visit: Payer: BC Managed Care – PPO

## 2023-06-20 ENCOUNTER — Other Ambulatory Visit: Payer: Self-pay

## 2023-06-20 ENCOUNTER — Telehealth: Payer: Self-pay

## 2023-06-20 ENCOUNTER — Other Ambulatory Visit (HOSPITAL_COMMUNITY): Payer: Self-pay

## 2023-06-20 ENCOUNTER — Encounter (HOSPITAL_COMMUNITY): Payer: Self-pay

## 2023-06-20 ENCOUNTER — Emergency Department (HOSPITAL_COMMUNITY): Payer: BC Managed Care – PPO

## 2023-06-20 ENCOUNTER — Emergency Department (HOSPITAL_COMMUNITY)
Admission: EM | Admit: 2023-06-20 | Discharge: 2023-06-20 | Disposition: A | Discharge status: Home / Self Care | Payer: BC Managed Care – PPO | Attending: Emergency Medicine | Admitting: Emergency Medicine

## 2023-06-20 DIAGNOSIS — T82897A Other specified complication of cardiac prosthetic devices, implants and grafts, initial encounter: Secondary | ICD-10-CM | POA: Diagnosis not present

## 2023-06-20 DIAGNOSIS — R55 Syncope and collapse: Secondary | ICD-10-CM | POA: Insufficient documentation

## 2023-06-20 DIAGNOSIS — R079 Chest pain, unspecified: Secondary | ICD-10-CM | POA: Diagnosis present

## 2023-06-20 DIAGNOSIS — Y838 Other surgical procedures as the cause of abnormal reaction of the patient, or of later complication, without mention of misadventure at the time of the procedure: Secondary | ICD-10-CM | POA: Diagnosis not present

## 2023-06-20 DIAGNOSIS — I4581 Long QT syndrome: Secondary | ICD-10-CM | POA: Insufficient documentation

## 2023-06-20 DIAGNOSIS — Z4502 Encounter for adjustment and management of automatic implantable cardiac defibrillator: Secondary | ICD-10-CM | POA: Diagnosis not present

## 2023-06-20 DIAGNOSIS — I4901 Ventricular fibrillation: Secondary | ICD-10-CM | POA: Diagnosis not present

## 2023-06-20 LAB — CUP PACEART REMOTE DEVICE CHECK
Battery Remaining Percentage: 70 %
Date Time Interrogation Session: 20241118055500
HighPow Impedance: 65 Ohm
Implantable Lead Connection Status: 753985
Implantable Lead Implant Date: 20150521
Implantable Lead Location: 753862
Implantable Lead Model: 3400
Implantable Pulse Generator Implant Date: 20220401
Pulse Gen Serial Number: 157706

## 2023-06-20 LAB — CBC
HCT: 42.1 % (ref 36.0–46.0)
Hemoglobin: 14.1 g/dL (ref 12.0–15.0)
MCH: 30.5 pg (ref 26.0–34.0)
MCHC: 33.5 g/dL (ref 30.0–36.0)
MCV: 91.1 fL (ref 80.0–100.0)
Platelets: 231 10*3/uL (ref 150–400)
RBC: 4.62 MIL/uL (ref 3.87–5.11)
RDW: 11.9 % (ref 11.5–15.5)
WBC: 4.1 10*3/uL (ref 4.0–10.5)
nRBC: 0 % (ref 0.0–0.2)

## 2023-06-20 LAB — BASIC METABOLIC PANEL
Anion gap: 7 (ref 5–15)
BUN: 6 mg/dL (ref 6–20)
CO2: 27 mmol/L (ref 22–32)
Calcium: 9.5 mg/dL (ref 8.9–10.3)
Chloride: 103 mmol/L (ref 98–111)
Creatinine, Ser: 0.93 mg/dL (ref 0.44–1.00)
GFR, Estimated: >60 mL/min (ref >60)
Glucose, Bld: 99 mg/dL (ref 70–99)
Potassium: 3.5 mmol/L (ref 3.5–5.1)
Sodium: 137 mmol/L (ref 135–145)

## 2023-06-20 LAB — TROPONIN I (HIGH SENSITIVITY)
Troponin I (High Sensitivity): <2 ng/L (ref <18)
Troponin I (High Sensitivity): <2 ng/L (ref <18)

## 2023-06-20 LAB — MAGNESIUM: Magnesium: 1.9 mg/dL (ref 1.7–2.4)

## 2023-06-20 LAB — HCG, SERUM, QUALITATIVE: Preg, Serum: NEGATIVE

## 2023-06-20 MED ORDER — NADOLOL 20 MG PO TABS
40.0000 mg | ORAL_TABLET | Freq: Every day | ORAL | 6 refills | Status: DC
  Filled 2023-06-20: qty 60, 30d supply, fill #0

## 2023-06-20 MED ORDER — POTASSIUM CHLORIDE CRYS ER 20 MEQ PO TBCR
40.0000 meq | EXTENDED_RELEASE_TABLET | Freq: Once | ORAL | Status: AC
  Administered 2023-06-20: 40 meq via ORAL
  Filled 2023-06-20: qty 2

## 2023-06-20 MED ORDER — ACETAMINOPHEN 500 MG PO TABS
1000.0000 mg | ORAL_TABLET | Freq: Once | ORAL | Status: AC
  Administered 2023-06-20: 1000 mg via ORAL
  Filled 2023-06-20: qty 2

## 2023-06-20 MED ORDER — NADOLOL 40 MG PO TABS
40.0000 mg | ORAL_TABLET | Freq: Every day | ORAL | Status: DC
  Administered 2023-06-20: 40 mg via ORAL
  Filled 2023-06-20: qty 1

## 2023-06-20 NOTE — ED Provider Triage Note (Addendum)
Emergency Medicine Provider Triage Evaluation Note  Courtney Rice , a 32 y.o. female  was evaluated in triage.  Pt complains of near syncopal episode shortly after waking this morning with subsequent left-sided chest pain and left arm heaviness.  Review of Systems  Positive: Near syncope chest pain Negative: Shortness of breath fevers chills nausea vomiting diaphoresis  Physical Exam  BP 123/76   Pulse 81   Temp 97.7 F (36.5 C) (Oral)   Resp 16   Ht 5\' 4"  (1.626 m)   Wt 57.7 kg   LMP 06/09/2023 (Approximate)   SpO2 100%   BMI 21.83 kg/m  Gen:   Awake, no distress   Resp:  Normal effort  MSK:   Moves extremities without difficulty.  5 out of 5 strength all 4 extremities Other:  Well-appearing  Medical Decision Making  Medically screening exam initiated at 8:25 AM.  Appropriate orders placed.  Beonca M Montavon was informed that the remainder of the evaluation will be completed by another provider, this initial triage assessment does not replace that evaluation, and the importance of remaining in the ED until their evaluation is complete.  [id number].  I was informed by one of our nurses that the patient received a defibrillation from her ICD at 551 this morning.  The EGM will be attached and we will make her ED physician aware of this as well    Royanne Foots, DO 06/20/23 0826    Royanne Foots, DO 06/20/23 [phone]

## 2023-06-20 NOTE — Telephone Encounter (Signed)
EP APP saw patient in ED. Please see note.

## 2023-06-20 NOTE — ED Triage Notes (Addendum)
Pt c/o left sided chest pain started this morning. Pt states she thinks she had a syncopal episode then after she developed left sided chest pain. Pt states she has a defibrillator. Pt denies N/V, SOB. Pt states this morning the chest pain was worse and while it was happening her left arm went weak.

## 2023-06-20 NOTE — Discharge Instructions (Signed)
No driving until cleared by cardiologist.  Work note given.  Take medication as prescribed by cardiologist.

## 2023-06-20 NOTE — Consult Note (Addendum)
Cardiology Consultation   Patient ID: UVA BORSETH MRN: [id number]; DOB: Jun 08, 1991  Admit date: 06/20/2023 Date of Consult: 06/20/2023  PCP:  Etta Grandchild, MD   [facility] Providers Cardiologist:  None  Electrophysiologist:  Lewayne Bunting, MD  {  Patient Profile:   Courtney Rice is a 32 y.o. female with a hx of congenital Long QT w/hx of syncope PMVT/torsades during pregnancy > S-ICD who is being seen 06/20/2023 for the evaluation of ICD shock at the request of Dr. Jodi Mourning.  History of Present Illness:   Courtney Rice saw Dr. Ladona Ridgel May 2024, (he mentions her mother and grandmother are patients of mine, both with ICD's and episodes of VF with successful ICD therapies as well). She was doing well at the time of this visit, doing some exercise, not much Advised she resume bisoprolol 2.5mg  daily (NOT noted on her home list of meds)   Came to the ER after being shocked by her device Pt came with reports of a syncopal event at home, denied ICD shock though device interrogation did in fact note she had been shocked appropriately  LABS K+ 3.5 (given replacement) Mag 1.9  BUN/Creat 6/0.93 HS Trop <2 WBC 4.1 H/H 14/42 Plts 231 Urine Pregnancy negative  Home meds reviewed Appear different then her last out pt list  She confirms current med list is accurate Never started the bisoprolol  Reports feeling well of ate, no fever/illness, symptoms of any kind Had just gotten up was ambulating from her bedroom when she became acutely lightheaded and then fainted Denies trauma/injury  ICD interrogation Battery and electrode measurements are good One appropriate shock for PMVT/VF   Past Medical History:  Diagnosis Date   AICD (automatic cardioverter/defibrillator) present    Long Q-T syndrome Type 2    a. post partum syncope and VT PM;  b. 11/2013 s/p BSX SubQ ICD placement.   Migraine    Pre-syncope admitted 08/01/2015   a. felt to be 2/2 orthostatic  hypotension - propranolol decreased.     Past Surgical History:  Procedure Laterality Date   IMPLANTABLE CARDIOVERTER DEFIBRILLATOR IMPLANT  12-20-2013   BSX SubQ ICD implanted by Dr Graciela Husbands   IMPLANTABLE CARDIOVERTER DEFIBRILLATOR IMPLANT N/A 12/20/2013   Procedure: SUB Q ICD;  Surgeon: Duke Salvia, MD;  Location: [facility] CATH LAB;  Service: Cardiovascular;  Laterality: N/A;   SUBQ ICD CHANGEOUT N/A 10/31/2020   Procedure: SUBQ ICD CHANGEOUT;  Surgeon: Duke Salvia, MD;  Location: [facility] INVASIVE CV LAB;  Service: Cardiovascular;  Laterality: N/A;   TONSILLECTOMY AND ADENOIDECTOMY  2011     Home Medications:  Prior to Admission medications   Medication Sig Start Date End Date Taking? Authorizing Provider  dicyclomine (BENTYL) 10 MG capsule Take 1 capsule (10 mg total) by mouth every 6 (six) hours as needed for spasms. 06/15/23  Yes Zehr, Princella Pellegrini, PA-C  ibuprofen (ADVIL) 800 MG tablet Take 1 tablet (800 mg total) by mouth every 8 (eight) hours as needed. 05/19/23  Yes Lennart Pall, MD  linaclotide [facility]) 145 MCG CAPS capsule Take 1 capsule (145 mcg total) by mouth daily before breakfast. 03/08/23  Yes McMichael, Bayley M, PA-C    Inpatient Medications: Scheduled Meds:  potassium chloride SA  40 mEq Oral Once   Continuous Infusions:  PRN Meds:   Allergies:   No Known Allergies  Social History:   Social History   Socioeconomic History   Marital status: Single    Spouse name:  Not on file   Number of children: 1   Years of education: Not on file   Highest education level: Not on file  Occupational History   Occupation: Accountant  Tobacco Use   Smoking status: Never   Smokeless tobacco: Never  Vaping Use   Vaping status: Never Used  Substance and Sexual Activity   Alcohol use: No   Drug use: No   Sexual activity: Not Currently    Partners: Male    Birth control/protection: Condom  Other Topics Concern   Not on file  Social History Narrative   Not on file    Social Determinants of Health   Financial Resource Strain: Not on file  Food Insecurity: Not on file  Transportation Needs: Not on file  Physical Activity: Not on file  Stress: Not on file  Social Connections: Unknown (11/30/2021)   Received from [facility], [facility]   Social Network    Social Network: Not on file  Intimate Partner Violence: Unknown (11/03/2021)   Received from [employer], [facility]   HITS    Physically Hurt: Not on file    Insult or Talk Down To: Not on file    Threaten Physical Harm: Not on file    Scream or Curse: Not on file    Family History:   Family History  Problem Relation Age of Onset   Heart disease Mother    Diabetes Mother    Asthma Mother    COPD Mother    Lupus Mother    Stroke Mother    Hypertension Mother    Colon polyps Mother    Fibroids Mother    Kidney disease Mother    Fibromyalgia Mother    Sarcoidosis Mother    Cervical cancer Mother    Irritable bowel syndrome Mother    Liver disease Mother    Cystic fibrosis Mother    COPD Maternal Grandmother    Heart disease Maternal Grandmother    Breast cancer Maternal Aunt    Long QT syndrome Son    Hearing loss Neg Hx    Heart attack Neg Hx    Colon cancer Neg Hx    Stomach cancer Neg Hx      ROS:  Please see the history of present illness.  All other ROS reviewed and negative.     Physical Exam/Data:   Vitals:   06/20/23 0853 06/20/23 0900 06/20/23 0915 06/20/23 0930  BP: (!) 128/95 120/77  120/89  Pulse: 73 82 66   Resp: (!) 22 (!) 22 17 19   Temp:      TempSrc:      SpO2: 100% 100% 100%   Weight:      Height:       No intake or output data in the 24 hours ending 06/20/23 1003    06/20/2023    7:52 AM 06/15/2023   10:54 AM 03/08/2023   11:09 AM  Last 3 Weights  Weight (lbs) 127 lb 3.3 oz 127 lb 2 oz 125 lb  Weight (kg) 57.7 kg 57.664 kg 56.7 kg     Body mass index is 21.83 kg/m.  General:  Well nourished, well developed, in no acute  distress HEENT: normal Neck: no JVD Vascular: No carotid bruits Cardiac:  RRR; no murmurs, gallops or rubs Lungs:  CTA b/l, no wheezing, rhonchi or rales  Abd: soft, nontender Ext: no edema Musculoskeletal:  No deformities Skin: warm and dry  Neuro:  no focal abnormalities noted Psych:  Normal affect   EKG:  The EKG was personally reviewed and demonstrates:    SR QTc (manuallly measured QT , QTc )  Telemetry:  Telemetry was personally reviewed and demonstrates:   SR 70's  Relevant CV Studies:   Laboratory Data:  High Sensitivity Troponin:   Recent Labs  Lab 06/20/23 0800  TROPONINIHS <2     Chemistry Recent Labs  Lab 06/20/23 0800  NA 137  K 3.5  CL 103  CO2 27  GLUCOSE 99  BUN 6  CREATININE 0.93  CALCIUM 9.5  MG 1.9  GFRNONAA >60  ANIONGAP 7    No results for input(s): "PROT", "ALBUMIN", "AST", "ALT", "ALKPHOS", "BILITOT" in the last 168 hours. Lipids No results for input(s): "CHOL", "TRIG", "HDL", "LABVLDL", "LDLCALC", "CHOLHDL" in the last 168 hours.  Hematology Recent Labs  Lab 06/20/23 0800  WBC 4.1  RBC 4.62  HGB 14.1  HCT 42.1  MCV 91.1  MCH 30.5  MCHC 33.5  RDW 11.9  PLT 231   Thyroid No results for input(s): "TSH", "FREET4" in the last 168 hours.  BNPNo results for input(s): "BNP", "PROBNP" in the last 168 hours.  DDimer No results for input(s): "DDIMER" in the last 168 hours.   Radiology/Studies:  DG Chest 2 View  Result Date: 06/20/2023 CLINICAL DATA:  Left-sided chest pain. EXAM: CHEST - 2 VIEW COMPARISON:  08/16/2015. FINDINGS: Bilateral lung fields are clear. Bilateral costophrenic angles are clear. Normal cardio-mediastinal silhouette. Stable positioning of the left-sided ICD device. No acute osseous abnormalities. The soft tissues are within normal limits. IMPRESSION: No active cardiopulmonary disease. Electronically Signed   By: Jules Schick M.D.   On: 06/20/2023 08:25   CUP PACEART REMOTE DEVICE  CHECK  Result Date: 06/20/2023 Scheduled remote reviewed. Normal device function.  1 sustained VT event 11/18 @ 05:51 converted with 1 shock - route to triage high alert per protocol, pt currently in the ED per EPIC Next remote 91 days.Toula Moos, [employer] Configuration: Secondary Gain Setting: 1X Post Shock Pacing: ON Jun 20, 2023 05:51 EST - Yellow Alert - Shock therapy delivered to convert arrhythmia (treated episode).    Assessment and Plan:   Congenital long QT VF/torsades 3. Appropriate ICD therapy  Discussed importance of medication compliance Will start Nadolol 40mg  daily  Dr. Lalla Brothers discussed with ER MD< to give a dose in the ER monitor a couple hours, if HR remains stable OK to discharge  Discussed [email or web address] with the pt, to call if any questions and prior to starting anything new, clear through us/pharmacy  Discussed [place] law, no driving 6 months with the patient, she actually brought it up 1st Discussed importance of medication ompliance EP follow up will be arranged  Risk Assessment/Risk Scores:    For questions or updates, please contact [facility] Please consult [email or web address] for contact info under    Signed, Sheilah Pigeon, PA-C  06/20/2023 10:03 AM

## 2023-06-20 NOTE — Telephone Encounter (Signed)
Alert received from [employer] for 1 sustained VT event 11/18 @ 05:51 converted with 1 shock - route to triage high alert per protocol, pt currently in the ED. Secure chat sent to Dr. Jodi Mourning, MD, ED physician.  Will

## 2023-06-20 NOTE — ED Provider Notes (Signed)
EMERGENCY DEPARTMENT AT [facility] Provider Note   CSN: [id number] Arrival date & time: 06/20/23  0865     History  Chief Complaint  Patient presents with   Chest Pain    Courtney Rice is a 32 y.o. female.  Patient presents after near syncopal events soon after waking this morning felt left upper chest pain and then had brief syncopal event.  Patient denies shortness of breath, cough, fevers, vomiting, chills.  No concerning headaches.  Patient has mild discomfort currently but feels overall improved.  No history of ICD shock.  Patient's had for approximately 9 years follows with Dr. Ladona Ridgel due to familial prolonged QT syndrome, mother with similar.  The history is provided by the patient.  Chest Pain Associated symptoms: no abdominal pain, no back pain, no fever, no headache, no shortness of breath and no vomiting        Home Medications Prior to Admission medications   Medication Sig Start Date End Date Taking? Authorizing Provider  dicyclomine (BENTYL) 10 MG capsule Take 1 capsule (10 mg total) by mouth every 6 (six) hours as needed for spasms. 06/15/23  Yes Zehr, Princella Pellegrini, PA-C  ibuprofen (ADVIL) 800 MG tablet Take 1 tablet (800 mg total) by mouth every 8 (eight) hours as needed. 05/19/23  Yes Lennart Pall, MD  linaclotide [facility]) 145 MCG CAPS capsule Take 1 capsule (145 mcg total) by mouth daily before breakfast. 03/08/23  Yes McMichael, Bayley M, PA-C  nadolol (CORGARD) 20 MG tablet Take 2 tablets (40 mg total) by mouth daily. 06/20/23  Yes Sheilah Pigeon, PA-C      Allergies    Patient has no known allergies.    Review of Systems   Review of Systems  Constitutional:  Negative for chills and fever.  HENT:  Negative for congestion.   Eyes:  Negative for visual disturbance.  Respiratory:  Negative for shortness of breath.   Cardiovascular:  Positive for chest pain.  Gastrointestinal:  Negative for abdominal pain and vomiting.   Genitourinary:  Negative for dysuria and flank pain.  Musculoskeletal:  Negative for back pain, neck pain and neck stiffness.  Skin:  Negative for rash.  Neurological:  Positive for syncope and light-headedness. Negative for headaches.    Physical Exam Updated Vital Signs BP 111/81   Pulse 75   Temp 98.3 F (36.8 C) (Oral)   Resp 15   Ht 5\' 4"  (1.626 m)   Wt 57.7 kg   LMP 06/09/2023 (Approximate)   SpO2 100%   BMI 21.83 kg/m  Physical Exam Vitals and nursing note reviewed.  Constitutional:      General: She is not in acute distress.    Appearance: She is well-developed.  HENT:     Head: Normocephalic and atraumatic.     Comments: Minimal tenderness to left upper chest wall close to ICD, no signs of infection.    Mouth/Throat:     Mouth: Mucous membranes are moist.  Eyes:     General:        Right eye: No discharge.        Left eye: No discharge.     Conjunctiva/sclera: Conjunctivae normal.  Neck:     Trachea: No tracheal deviation.  Cardiovascular:     Rate and Rhythm: Normal rate and regular rhythm.     Heart sounds: No murmur heard. Pulmonary:     Effort: Pulmonary effort is normal.     Breath sounds: Normal breath sounds.  Abdominal:     General: There is no distension.     Palpations: Abdomen is soft.     Tenderness: There is no abdominal tenderness. There is no guarding.  Musculoskeletal:     Cervical back: Normal range of motion and neck supple. No rigidity.     Right lower leg: No edema.     Left lower leg: No edema.  Skin:    General: Skin is warm.     Capillary Refill: Capillary refill takes less than 2 seconds.     Findings: No rash.  Neurological:     General: No focal deficit present.     Mental Status: She is alert.     Cranial Nerves: No cranial nerve deficit.  Psychiatric:        Mood and Affect: Mood normal.     ED Results / Procedures / Treatments   Labs (all labs ordered are listed, but only abnormal results are displayed) Labs  Reviewed  BASIC METABOLIC PANEL  CBC  HCG, SERUM, QUALITATIVE  MAGNESIUM  TROPONIN I (HIGH SENSITIVITY)  TROPONIN I (HIGH SENSITIVITY)    EKG EKG Interpretation Date/Time:  Monday June 20 2023 07:28:54 EST Ventricular Rate:  82 PR Interval:  128 QRS Duration:  72 QT Interval:  398 QTC Calculation: 464 R Axis:   94  Text Interpretation: Normal sinus rhythm with sinus arrhythmia Right atrial enlargement Rightward axis Nonspecific T wave abnormality Prolonged QT Abnormal ECG When compared with ECG of 16-Aug-2015 08:53, PREVIOUS ECG IS PRESENT Confirmed by Blane Ohara [phone]) on 06/20/2023 9:16:03 AM  Radiology DG Chest 2 View  Result Date: 06/20/2023 CLINICAL DATA:  Left-sided chest pain. EXAM: CHEST - 2 VIEW COMPARISON:  08/16/2015. FINDINGS: Bilateral lung fields are clear. Bilateral costophrenic angles are clear. Normal cardio-mediastinal silhouette. Stable positioning of the left-sided ICD device. No acute osseous abnormalities. The soft tissues are within normal limits. IMPRESSION: No active cardiopulmonary disease. Electronically Signed   By: Jules Schick M.D.   On: 06/20/2023 08:25   CUP PACEART REMOTE DEVICE CHECK  Result Date: 06/20/2023 Scheduled remote reviewed. Normal device function.  1 sustained VT event 11/18 @ 05:51 converted with 1 shock - route to triage high alert per protocol, pt currently in the ED per EPIC Next remote 91 days.[place]a Moos, [employer] Configuration: Secondary Gain Setting: 1X Post Shock Pacing: ON Jun 20, 2023 05:51 EST - Yellow Alert - Shock therapy delivered to convert arrhythmia (treated episode).   Procedures Procedures    Medications Ordered in ED Medications  nadolol (CORGARD) tablet 40 mg (40 mg Oral Given 06/20/23 1201)  acetaminophen (TYLENOL) tablet 1,000 mg (1,000 mg Oral Given 06/20/23 0921)  potassium chloride SA (KLOR-CON M) CR tablet 40 mEq (40 mEq Oral Given 06/20/23 1006)    ED Course/ Medical Decision Making/ A&P                                  Medical Decision Making Amount and/or Complexity of Data Reviewed Labs: ordered. Radiology: ordered.  Risk OTC drugs. Prescription drug management.  Patient presents after brief syncopal events likely secondary to ICD shock.  Discussed with mother and patient prolonged QT syndrome which is likely the cause of this.  Plan for nursing to assess [facility] [employer] ICD.  General blood work sent patient on cardiac monitor.  EKG reviewed independently no acute abnormality sinus rhythm.  Paged cardiology.  Discussed with cardiology, nadolol was given and  patient mod for 2 hours.  Patient no signs or symptoms, heart rate was in the 60s.  Patient stable for discharge instructed not to drive.  Note given for work she will need to do virtual or have support system to drive her there.  Blood work reassuring reviewed independently electrolytes overall unremarkable, potassium mild borderline low oral potassium given.  Discussed and reviewed [employer] rhythm showing torsades that was shocked.         Final Clinical Impression(s) / ED Diagnoses Final diagnoses:  ICD (implantable cardioverter-defibrillator) discharge  Syncope, unspecified syncope type    Rx / DC Orders ED Discharge Orders          Ordered    nadolol (CORGARD) 20 MG tablet  Daily        06/20/23 1128              Blane Ohara, MD 06/20/23 1401

## 2023-06-23 ENCOUNTER — Telehealth: Payer: Self-pay | Admitting: Internal Medicine

## 2023-06-23 NOTE — Telephone Encounter (Signed)
Pt states that she is still not feeling well after ED visit on 11/18. Pt says that her employer is not helping with the added stress and would like to know if she needs to be taken out of work or what else is it that she is able to do. Please advise

## 2023-06-23 NOTE — Telephone Encounter (Signed)
Spoke with Pt who states she is being stressed by work and afraid since she was shocked on Monday (06/20/23). Would like to see if she can get a drs note to be out of work for a few days as she feels like she can not do her job at this moment under this stress. Told her I would have Dr Ladona Ridgel review her ED visit once he is back in the morning and she what we can do for her.

## 2023-06-24 NOTE — Progress Notes (Signed)
Agree with the assessment and plan as outlined by Doug Sou, PA-C.  As previously discussed with her, if no response with increasing dose of Linzess, can at the very least perform anal rectal manometry to try to establish whether or not she has pelvic floor dyssynergia which would be much more responsive to pelvic floor PT.  I do understand that she has difficulty getting time off of work to follow-up with PT, which is why getting ARM might be beneficial.  Will see what happens with higher dosing.  Deb Loudin, DO, [facility]

## 2023-07-04 ENCOUNTER — Telehealth: Payer: Self-pay | Admitting: Internal Medicine

## 2023-07-04 NOTE — Telephone Encounter (Signed)
Patient was calling to get results

## 2023-07-04 NOTE — Telephone Encounter (Signed)
Patient called about remote transmission on 06/20/23. Informed pt of Dr. Koren Bound note and advised if he has further questions to please let us know. Pt was appreciative of call.

## 2023-07-05 NOTE — Progress Notes (Unsigned)
Electrophysiology Office Note:   Date:  07/06/2023  ID:  Courtney Rice, DOB 03-08-1991, MRN [id number]  Primary Cardiologist: None Electrophysiologist: Lewayne Bunting, MD      History of Present Illness:   Courtney Rice is a 32 y.o. female with h/o long QT with syncope, polymorphic VT during pregnancy s/p S-ICD, migraines seen today for post hospital follow up.    Seen in ER by EP 06/20/23 with reports of being shocked by her device. She woke that am and got out of bed.  When walking across the room, she developed dizziness and then LOC.  She woke with chest pain and felt she had been shocked by her ICD.  Hx of familial prolonged QT syndrome. Device interrogation showed appropriate ICD discharge for VF.  She was started on Nadolol 40mg  daily. She previously was not taking the prior prescribed bisoprolol.   Since discharge from hospital the patient reports she has been very stressed with work.  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 She has not had further shocks. Notes she feels fatigue and "random" dizziness but no syncope.  She states she is working on Contractor or disability with her counselor due to anxiety/stress.   She denies chest pain, palpitations, dyspnea, PND, orthopnea, nausea, vomiting, dizziness, syncope, edema, weight gain, or early satiety.   Review of systems complete and found to be negative unless listed in HPI.   EP Information / Studies Reviewed:    EKG is ordered today. Personal review as below.  EKG Interpretation Date/Time:  Wednesday July 06 2023 14:15:20 EST Ventricular Rate:  49 PR Interval:  128 QRS Duration:  78 QT Interval:  568 QTC Calculation: 513 R Axis:   92  Text Interpretation: Sinus bradycardia Rightward axis Prolonged QT Confirmed by Canary Brim ([id number]) on 07/06/2023 3:01:17 PM   Studies:  ECHO 2016 > LVEF 55-60%    Arrhythmia / AAD VT / VF in setting of Congenital Long QT    Device [employer], S-ICD implanted 12/20/13 for Long QT, change on 10/31/20          Physical Exam:   VS:  BP 98/68   Pulse (!) 46   Ht 5\' 4"  (1.626 m)   Wt 127 lb 9.6 oz (57.9 kg)   LMP 06/09/2023 (Approximate)   SpO2 98%   BMI 21.90 kg/m    Wt Readings from Last 3 Encounters:  07/06/23 127 lb 9.6 oz (57.9 kg)  06/20/23 127 lb 3.3 oz (57.7 kg)  06/15/23 127 lb 2 oz (57.7 kg)     GEN: Well nourished, well developed in no acute distress NECK: No JVD; No carotid bruits CARDIAC: Regular rate and rhythm, no murmurs, rubs, gallops RESPIRATORY:  Clear to auscultation without rales, wheezing or rhonchi  ABDOMEN: Soft, non-tender, non-distended EXTREMITIES:  No edema; No deformity   ASSESSMENT AND PLAN:    Congenital Long QT VF / Torsades  Recent Appropriate ICD Therapy  -reduce Nadolol to 20mg  daily, 90d supply  -medication compliance reviewed with patient  -confirmed she is taking the medication   -EKG reviewed in clinic, manual QTc corrects to 488 ms for V1 -prior labs reviewed / normal K+/Mg -no driving for 6 months from 06/20/23 (12/19/23) -note given to support [employer] for driving restrictions after after recent HV therapy (reasons not described in letter as confidential). Pt called back asking for note that states she needs to work from home.  No further notes given at this time.   Follow up with Dr. Ladona Ridgel  4 months   Signed, Canary Brim, MSN, APRN, NP-C, AGACNP-BC [facility] - Electrophysiology  07/06/2023, 5:06 PM

## 2023-07-06 ENCOUNTER — Ambulatory Visit: Payer: BC Managed Care – PPO | Attending: Pulmonary Disease | Admitting: Pulmonary Disease

## 2023-07-06 ENCOUNTER — Encounter: Payer: Self-pay | Admitting: *Deleted

## 2023-07-06 ENCOUNTER — Telehealth: Payer: Self-pay | Admitting: Internal Medicine

## 2023-07-06 ENCOUNTER — Encounter: Payer: Self-pay | Admitting: Pulmonary Disease

## 2023-07-06 VITALS — BP 98/68 | HR 46 | Ht 64.0 in | Wt 127.6 lb

## 2023-07-06 DIAGNOSIS — I4581 Long QT syndrome: Secondary | ICD-10-CM

## 2023-07-06 DIAGNOSIS — I4729 Other ventricular tachycardia: Secondary | ICD-10-CM | POA: Diagnosis not present

## 2023-07-06 DIAGNOSIS — Z95810 Presence of automatic (implantable) cardiac defibrillator: Secondary | ICD-10-CM | POA: Diagnosis not present

## 2023-07-06 MED ORDER — NADOLOL 20 MG PO TABS: 20.0000 mg | ORAL_TABLET | Freq: Every day | ORAL | 3 refills | Status: DC

## 2023-07-06 NOTE — Telephone Encounter (Signed)
Patient calling in to ask for a note to work remotely for her job since she is unable to drive. Please advise

## 2023-07-06 NOTE — Patient Instructions (Signed)
Medication Instructions:  Decrease nadolol to 20 mg daily. *If you need a refill on your cardiac medications before your next appointment, please call your pharmacy*  Lab Work: None ordered If you have labs (blood work) drawn today and your tests are completely normal, you will receive your results only by: MyChart Message (if you have MyChart) OR A paper copy in the mail If you have any lab test that is abnormal or we need to change your treatment, we will call you to review the results.  Follow-Up: At [facility], you and your health needs are our priority.  As part of our continuing mission to provide you with exceptional heart care, we have created designated Provider Care Teams.  These Care Teams include your primary Cardiologist (physician) and Advanced Practice Providers (APPs -  Physician Assistants and Nurse Practitioners) who all work together to provide you with the care you need, when you need it.  Your next appointment:   4 month(s)  Provider:   Lewayne Bunting, MD

## 2023-07-06 NOTE — Telephone Encounter (Signed)
Called pt after speaking with Canary Brim, NP and informed pt that the letter for work we wrote for her is the only note/accommodations we are able to make for her at this time. Pt stated understanding.

## 2023-07-07 ENCOUNTER — Telehealth: Payer: Self-pay | Admitting: Gastroenterology

## 2023-07-07 MED ORDER — LINACLOTIDE 290 MCG PO CAPS: 290.0000 ug | ORAL_CAPSULE | Freq: Every day | ORAL | 1 refills | Status: DC

## 2023-07-07 NOTE — Telephone Encounter (Signed)
Linzess 290 mcg - 90 day supply sent to pharmacy.

## 2023-07-07 NOTE — Telephone Encounter (Signed)
Inbound call from patient, requesting for Linzess 29- MCG be called into her pharmacy. Patient is also requesting for more than a 30 day supply. She states she will not have insurance coverage later and would like to get it now.

## 2023-07-08 ENCOUNTER — Encounter: Payer: Self-pay | Admitting: Pulmonary Disease

## 2023-07-08 NOTE — Telephone Encounter (Signed)
Patient is following up. She would like to further discuss returning to work.

## 2023-07-08 NOTE — Telephone Encounter (Signed)
Called Pt. Left message. There is a signed letter for her that will be placed at the front.

## 2023-07-11 ENCOUNTER — Telehealth: Payer: Self-pay | Admitting: Internal Medicine

## 2023-07-11 NOTE — Telephone Encounter (Signed)
Pt states she would like to speak with the doctor regarding her last appt and her defibrillator. Please advise

## 2023-07-12 NOTE — Telephone Encounter (Signed)
Patient is returning call.  °

## 2023-07-13 NOTE — Progress Notes (Signed)
Remote ICD transmission.   

## 2023-07-14 ENCOUNTER — Telehealth: Payer: Self-pay | Admitting: Internal Medicine

## 2023-07-14 NOTE — Telephone Encounter (Signed)
Mailbox full unable to reach patient

## 2023-07-14 NOTE — Telephone Encounter (Signed)
  The patient's mom called and is requesting if Dr. Ladona Ridgel can call the patient. They would like to express their experience from the patient's last visit. They would appreciate it if the patient could speak with Dr. Ladona Ridgel.

## 2023-07-15 NOTE — Telephone Encounter (Signed)
Pharmacy Patient Advocate Encounter  Received notification from [facility] that Prior Authorization for Ubrelvy 50MG  tablets  has been DENIED.  No reason given; No denial letter received via Fax or CMM. It has been requested and will be uploaded to the media tab once received.   PA #/Case ID/Reference #: [id number]

## 2023-07-15 NOTE — Telephone Encounter (Signed)
Was unable to leave message on any of numbers listed in chart.

## 2023-08-07 ENCOUNTER — Other Ambulatory Visit: Payer: Self-pay | Admitting: Obstetrics and Gynecology

## 2023-08-07 DIAGNOSIS — N946 Dysmenorrhea, unspecified: Secondary | ICD-10-CM

## 2023-08-08 MED ORDER — IBUPROFEN 800 MG PO TABS: 800.0000 mg | ORAL_TABLET | Freq: Three times a day (TID) | ORAL | 0 refills | Status: DC | PRN

## 2023-08-09 ENCOUNTER — Other Ambulatory Visit: Payer: Self-pay | Admitting: Gastroenterology

## 2023-08-31 ENCOUNTER — Ambulatory Visit: Payer: BC Managed Care – PPO | Admitting: Obstetrics & Gynecology

## 2023-09-19 ENCOUNTER — Ambulatory Visit (INDEPENDENT_AMBULATORY_CARE_PROVIDER_SITE_OTHER): Payer: BC Managed Care – PPO

## 2023-09-19 DIAGNOSIS — I4581 Long QT syndrome: Secondary | ICD-10-CM

## 2023-09-21 LAB — CUP PACEART REMOTE DEVICE CHECK
Battery Remaining Percentage: 68 %
Date Time Interrogation Session: 20250219092400
HighPow Impedance: 70 Ohm
Implantable Lead Connection Status: 753985
Implantable Lead Implant Date: 20150521
Implantable Lead Location: 753862
Implantable Lead Model: 3400
Implantable Pulse Generator Implant Date: 20220401
Pulse Gen Serial Number: 157706

## 2023-10-18 ENCOUNTER — Encounter: Payer: Self-pay | Admitting: Internal Medicine

## 2023-10-25 ENCOUNTER — Encounter: Payer: Self-pay | Admitting: Internal Medicine

## 2023-10-25 ENCOUNTER — Ambulatory Visit: Admitting: Internal Medicine

## 2023-10-25 VITALS — BP 100/70 | HR 67 | Temp 97.9°F | Resp 16 | Ht 64.0 in | Wt 132.0 lb

## 2023-10-25 DIAGNOSIS — E559 Vitamin D deficiency, unspecified: Secondary | ICD-10-CM

## 2023-10-25 DIAGNOSIS — F5105 Insomnia due to other mental disorder: Secondary | ICD-10-CM | POA: Diagnosis not present

## 2023-10-25 DIAGNOSIS — G43E09 Chronic migraine with aura, not intractable, without status migrainosus: Secondary | ICD-10-CM | POA: Diagnosis not present

## 2023-10-25 DIAGNOSIS — Z131 Encounter for screening for diabetes mellitus: Secondary | ICD-10-CM | POA: Diagnosis not present

## 2023-10-25 DIAGNOSIS — Z0001 Encounter for general adult medical examination with abnormal findings: Secondary | ICD-10-CM

## 2023-10-25 DIAGNOSIS — K5904 Chronic idiopathic constipation: Secondary | ICD-10-CM | POA: Diagnosis not present

## 2023-10-25 DIAGNOSIS — F409 Phobic anxiety disorder, unspecified: Secondary | ICD-10-CM

## 2023-10-25 DIAGNOSIS — Z23 Encounter for immunization: Secondary | ICD-10-CM

## 2023-10-25 DIAGNOSIS — Z0000 Encounter for general adult medical examination without abnormal findings: Secondary | ICD-10-CM | POA: Diagnosis not present

## 2023-10-25 LAB — MAGNESIUM: Magnesium: 2.1 mg/dL (ref 1.5–2.5)

## 2023-10-25 LAB — BASIC METABOLIC PANEL
BUN: 8 mg/dL (ref 6–23)
CO2: 31 meq/L (ref 19–32)
Calcium: 9.6 mg/dL (ref 8.4–10.5)
Chloride: 104 meq/L (ref 96–112)
Creatinine, Ser: 0.61 mg/dL (ref 0.40–1.20)
GFR: 118.73 mL/min (ref >60.00)
Glucose, Bld: 93 mg/dL (ref 70–99)
Potassium: 4.2 meq/L (ref 3.5–5.1)
Sodium: 139 meq/L (ref 135–145)

## 2023-10-25 LAB — VITAMIN D 25 HYDROXY (VIT D DEFICIENCY, FRACTURES): VITD: 49.46 ng/mL (ref 30.00–100.00)

## 2023-10-25 LAB — TSH: TSH: 0.82 u[IU]/mL (ref 0.35–5.50)

## 2023-10-25 LAB — HEMOGLOBIN A1C: Hgb A1c MFr Bld: 5.6 % (ref 4.6–6.5)

## 2023-10-25 MED ORDER — NURTEC 75 MG PO TBDP: 1.0000 | ORAL_TABLET | ORAL | 1 refills | Status: DC | PRN

## 2023-10-25 MED ORDER — TRAZODONE HCL 50 MG PO TABS: 50.0000 mg | ORAL_TABLET | Freq: Every day | ORAL | 1 refills | Status: DC

## 2023-10-25 NOTE — Progress Notes (Unsigned)
 Subjective:  Patient ID: Courtney Rice, female    DOB: [birth year]  Age: 33 y.o. MRN: [id number]  CC: Annual Exam, Hyperlipidemia, and Abdominal Pain   HPI Courtney Rice presents for a CPX and f/up ----  Discussed the use of AI scribe software for clinical note transcription with the patient, who gave verbal consent to proceed.  History of Present Illness   Courtney Rice is a 33 year old female with migraines who presents with frequent headaches.  She experiences frequent headaches, described as migraines, occurring at least five days a week. The headaches are often throbbing and can cause nausea and dizziness. They sometimes resolve without medication but typically persist for a couple of days even with treatment. She has been using Excedrin and Advil, which have not been consistently effective. Previously, she was prescribed Bernita Raisin, which was effective, but her insurance stopped covering it last year.  She experienced a cardiac arrest in November and was hospitalized, requiring defibrillation in February. She has had a defibrillator for nearly ten years due to a longstanding heart condition that initially presented with episodes of syncope. She is scheduled for a follow-up with her cardiologist in early April. She continues to take nadolol for her heart condition.  She reports ongoing issues with constipation, for which she takes Linzess and Bentyl. Despite medication, she still experiences symptoms such as nausea and pain. She sees a gastroenterologist for these issues.  She has a history of anxiety and depression but is unable to take prescribed medications due to her heart condition. She experiences high levels of stress, which she believes may contribute to her heart issues and insomnia. 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 No numbness, weakness, tingling, slurred speech, or trouble walking. No pain at the time of the  visit.       Outpatient Medications Prior to Visit  Medication Sig Dispense Refill   dicyclomine (BENTYL) 10 MG capsule Take 1 capsule (10 mg total) by mouth every 6 (six) hours as needed for spasms. 30 capsule 3   ibuprofen (ADVIL) 800 MG tablet Take 1 tablet (800 mg total) by mouth every 8 (eight) hours as needed. 30 tablet 0   linaclotide (LINZESS) 290 MCG CAPS capsule Take 1 capsule (290 mcg total) by mouth daily before breakfast. 90 capsule 1   nadolol (CORGARD) 20 MG tablet Take 1 tablet (20 mg total) by mouth daily. 90 tablet 3   No facility-administered medications prior to visit.    ROS Review of Systems  Constitutional: Negative.  Negative for appetite change, chills, diaphoresis, fatigue and fever.  HENT: Negative.    Eyes:  Negative for visual disturbance.  Respiratory: Negative.  Negative for cough, chest tightness, shortness of breath and wheezing.   Cardiovascular:  Negative for chest pain, palpitations and leg swelling.  Gastrointestinal:  Positive for abdominal pain, constipation and nausea. Negative for blood in stool and vomiting.  Genitourinary:  Negative for difficulty urinating, dysuria and hematuria.  Musculoskeletal: Negative.  Negative for arthralgias and myalgias.  Skin: Negative.   Neurological:  Positive for headaches. Negative for weakness.  Hematological:  Negative for adenopathy. Does not bruise/bleed easily.  Psychiatric/Behavioral:  Positive for sleep disturbance. Negative for agitation, behavioral problems, confusion, decreased concentration, dysphoric mood, self-injury and suicidal ideas. The patient is nervous/anxious.     Objective:  BP 100/70 (BP Location: Left Arm, Patient Position: Sitting, Cuff Size: Small)   Pulse 67   Temp 97.9  F (36.6 C) (Oral)   Resp 16   Ht 5\' 4"  (1.626 m)   Wt 132 lb (59.9 kg)   LMP 10/15/2023   SpO2 95%   BMI 22.66 kg/m   BP Readings from Last 3 Encounters:  10/25/23 100/70  07/06/23 98/68  06/20/23 111/81     Wt Readings from Last 3 Encounters:  10/25/23 132 lb (59.9 kg)  07/06/23 127 lb 9.6 oz (57.9 kg)  06/20/23 127 lb 3.3 oz (57.7 kg)    Physical Exam Vitals reviewed.  Constitutional:      Appearance: Normal appearance. She is not ill-appearing.  HENT:     Mouth/Throat:     Mouth: Mucous membranes are moist.  Eyes:     General: No scleral icterus.    Conjunctiva/sclera: Conjunctivae normal.  Cardiovascular:     Rate and Rhythm: Normal rate and regular rhythm.     Heart sounds: No murmur heard.    No friction rub. No gallop.  Pulmonary:     Effort: Pulmonary effort is normal.     Breath sounds: No stridor. No wheezing, rhonchi or rales.  Abdominal:     General: Abdomen is flat. Bowel sounds are decreased. There is no distension.     Palpations: Abdomen is soft. There is no hepatomegaly, splenomegaly or mass.     Tenderness: There is no abdominal tenderness. There is no guarding.     Hernia: No hernia is present.  Musculoskeletal:        General: Normal range of motion.     Cervical back: Neck supple.     Right lower leg: No edema.     Left lower leg: No edema.  Lymphadenopathy:     Cervical: No cervical adenopathy.  Skin:    General: Skin is warm and dry.     Coloration: Skin is not pale.  Neurological:     General: No focal deficit present.     Mental Status: She is alert. Mental status is at baseline.  Psychiatric:        Attention and Perception: Attention normal.        Mood and Affect: Affect normal. Mood is anxious.        Speech: Speech normal.        Behavior: Behavior normal.        Thought Content: Thought content normal.        Cognition and Memory: Cognition normal.        Judgment: Judgment normal.     Lab Results  Component Value Date   WBC 4.1 06/20/2023   HGB 14.1 06/20/2023   HCT 42.1 06/20/2023   PLT 231 06/20/2023   GLUCOSE 93 10/25/2023   TRIG 52.0 08/18/2022   ALT 16 08/18/2022   AST 20 08/18/2022   NA 139 10/25/2023   K 4.2  10/25/2023   CL 104 10/25/2023   CREATININE 0.61 10/25/2023   BUN 8 10/25/2023   CO2 31 10/25/2023   TSH 0.82 10/25/2023   HGBA1C 5.6 10/25/2023    DG Chest 2 View Result Date: 06/20/2023 CLINICAL DATA:  Left-sided chest pain. EXAM: CHEST - 2 VIEW COMPARISON:  08/16/2015. FINDINGS: Bilateral lung fields are clear. Bilateral costophrenic angles are clear. Normal cardio-mediastinal silhouette. Stable positioning of the left-sided ICD device. No acute osseous abnormalities. The soft tissues are within normal limits. IMPRESSION: No active cardiopulmonary disease. Electronically Signed   By: Jules Schick M.D.   On: 06/20/2023 08:25   CUP PACEART REMOTE DEVICE CHECK Result  Date: 06/20/2023 Scheduled remote reviewed. Normal device function.  1 sustained VT event 11/18 @ 05:51 converted with 1 shock - route to triage high alert per protocol, pt currently in the ED per EPIC Next remote 91 days.Toula Moos, CVRSSensing Configuration: Secondary Gain Setting: 1X Post Shock Pacing: ON Jun 20, 2023 05:51 EST - Yellow Alert - Shock therapy delivered to convert arrhythmia (treated episode).   Assessment & Plan:   Chronic idiopathic constipation- Labs are negative for secondary causes. -     Magnesium; Future -     Basic metabolic panel with GFR; Future -     TSH; Future  Chronic migraine with aura without status migrainosus, not intractable- Will start a CGRP-antagonist. -     Nurtec; Take 1 tablet (75 mg total) by mouth every other day as needed.  Dispense: 46 tablet; Refill: 1 -     Ambulatory referral to Neurology -     Nurtec; Take 1 tablet (75 mg total) by mouth every other day.  Encounter for general adult medical examination with abnormal findings- Exam completed, labs reviewed, vaccines reviewed and updated, no cancer screenings indicated, pt ed material was given.   Immunization due -     Tdap vaccine greater than or equal to 7yo IM  Vitamin D deficiency -     VITAMIN D 25 Hydroxy (Vit-D  Deficiency, Fractures); Future  Diabetes mellitus screening -     Hemoglobin A1c; Future  Insomnia due to anxiety and fear -     traZODone HCl; Take 1 tablet (50 mg total) by mouth at bedtime.  Dispense: 90 tablet; Refill: 1     Follow-up: Return in about 6 months (around 04/26/2024).  Sanda Linger, MD

## 2023-10-25 NOTE — Patient Instructions (Signed)

## 2023-10-26 NOTE — Addendum Note (Signed)
 Addended by: Geralyn Flash D on: 10/26/2023 02:42 PM   Modules accepted: Orders

## 2023-10-26 NOTE — Progress Notes (Signed)
 Remote ICD transmission.

## 2023-10-28 MED ORDER — NURTEC 75 MG PO TBDP: 1.0000 | ORAL_TABLET | ORAL | Status: DC

## 2023-10-31 ENCOUNTER — Other Ambulatory Visit: Payer: Self-pay | Admitting: Obstetrics and Gynecology

## 2023-10-31 DIAGNOSIS — N946 Dysmenorrhea, unspecified: Secondary | ICD-10-CM

## 2023-11-01 ENCOUNTER — Encounter: Payer: Self-pay | Admitting: Neurology

## 2023-11-01 ENCOUNTER — Ambulatory Visit: Payer: BC Managed Care – PPO | Admitting: Obstetrics & Gynecology

## 2023-11-02 ENCOUNTER — Other Ambulatory Visit: Payer: Self-pay | Admitting: Obstetrics and Gynecology

## 2023-11-02 DIAGNOSIS — N946 Dysmenorrhea, unspecified: Secondary | ICD-10-CM

## 2023-11-07 ENCOUNTER — Encounter: Admitting: Internal Medicine

## 2023-11-08 ENCOUNTER — Ambulatory Visit: Payer: BC Managed Care – PPO | Admitting: Internal Medicine

## 2023-11-18 ENCOUNTER — Ambulatory Visit: Attending: Cardiology | Admitting: Internal Medicine

## 2023-11-18 ENCOUNTER — Encounter: Payer: Self-pay | Admitting: Internal Medicine

## 2023-11-18 VITALS — BP 112/82 | HR 60 | Ht 64.0 in | Wt 128.0 lb

## 2023-11-18 DIAGNOSIS — I4581 Long QT syndrome: Secondary | ICD-10-CM | POA: Diagnosis not present

## 2023-11-18 LAB — CUP PACEART INCLINIC DEVICE CHECK
Date Time Interrogation Session: 20250418161337
Implantable Lead Connection Status: 753985
Implantable Lead Implant Date: 20150521
Implantable Lead Location: 753862
Implantable Lead Model: 3400
Implantable Pulse Generator Implant Date: 20220401
Pulse Gen Serial Number: 157706

## 2023-11-18 MED ORDER — NADOLOL 20 MG PO TABS: ORAL_TABLET | ORAL | 3 refills | Status: AC

## 2023-11-18 NOTE — Progress Notes (Signed)
 HPI [name] returns today for followup. She is a pleasant 33 yo woman with congenital long QT who had recurrent syncope and PMVT/TDP during pregnancy and underwent S-ICD insertion. She has not been on her beta blocker but has not had any ICD therapies. She has underwent gen change out 3 years ago. She has not had syncope. She had an ICD therapy for VF back in November. She was encouraged to start nadolol . Her mother and grandmother are patients of mine, both with ICD's and episodes of VF with successful ICD therapies. She has started back exercising but not too vigorously.  No Known Allergies   Current Outpatient Medications  Medication Sig Dispense Refill   dicyclomine  (BENTYL ) 10 MG capsule Take 1 capsule (10 mg total) by mouth every 6 (six) hours as needed for spasms. 30 capsule 3   ibuprofen  (ADVIL ) 800 MG tablet TAKE 1 TABLET(800 MG) BY MOUTH EVERY 8 HOURS AS NEEDED 30 tablet 0   linaclotide  (LINZESS ) 290 MCG CAPS capsule Take 1 capsule (290 mcg total) by mouth daily before breakfast. 90 capsule 1   nadolol  (CORGARD ) 20 MG tablet Take 20mg  in the morning and 10mg  in the evening. 135 tablet 3   Rimegepant Sulfate (NURTEC) 75 MG TBDP Take 1 tablet (75 mg total) by mouth every other day as needed. 46 tablet 1   Rimegepant Sulfate (NURTEC) 75 MG TBDP Take 1 tablet (75 mg total) by mouth every other day.     traZODone  (DESYREL ) 50 MG tablet Take 1 tablet (50 mg total) by mouth at bedtime. 90 tablet 1   No current facility-administered medications for this visit.     Past Medical History:  Diagnosis Date   AICD (automatic cardioverter/defibrillator) present    Long Q-T syndrome Type 2    a. post partum syncope and VT PM;  b. 11/2013 s/p BSX SubQ ICD placement.   Migraine    Pre-syncope admitted 08/01/2015   a. felt to be 2/2 orthostatic hypotension - propranolol  decreased.     ROS:   All systems reviewed and negative except as noted in the HPI.   Past Surgical History:   Procedure Laterality Date   IMPLANTABLE CARDIOVERTER DEFIBRILLATOR IMPLANT  12-20-2013   BSX SubQ ICD implanted by Dr Rodolfo Clan   IMPLANTABLE CARDIOVERTER DEFIBRILLATOR IMPLANT N/A 12/20/2013   Procedure: SUB Q ICD;  Surgeon: Verona Goodwill, MD;  Location: [facility] CATH LAB;  Service: Cardiovascular;  Laterality: N/A;   SUBQ ICD CHANGEOUT N/A 10/31/2020   Procedure: SUBQ ICD CHANGEOUT;  Surgeon: Verona Goodwill, MD;  Location: [facility] INVASIVE CV LAB;  Service: Cardiovascular;  Laterality: N/A;   TONSILLECTOMY AND ADENOIDECTOMY  2011     Family History  Problem Relation Age of Onset   Heart disease Mother    Diabetes Mother    Asthma Mother    COPD Mother    Lupus Mother    Stroke Mother    Hypertension Mother    Colon polyps Mother    Fibroids Mother    Kidney disease Mother    Fibromyalgia Mother    Sarcoidosis Mother    Cervical cancer Mother    Irritable bowel syndrome Mother    Liver disease Mother    Cystic fibrosis Mother    COPD Maternal Grandmother    Heart disease Maternal Grandmother    Breast cancer Maternal Aunt    Long QT syndrome Son    Hearing loss Neg Hx    Heart attack Neg  Hx    Colon cancer Neg Hx    Stomach cancer Neg Hx      Social History   Socioeconomic History   Marital status: Single    Spouse name: Not on file   Number of children: 1   Years of education: Not on file   Highest education level: Not on file  Occupational History   Occupation: Accountant  Tobacco Use   Smoking status: Never   Smokeless tobacco: Never  Vaping Use   Vaping status: Never Used  Substance and Sexual Activity   Alcohol use: No   Drug use: No   Sexual activity: Not Currently    Partners: Male    Birth control/protection: Condom  Other Topics Concern   Not on file  Social History Narrative   Not on file   Social Drivers of Health   Financial Resource Strain: Not on file  Food Insecurity: Not on file  Transportation Needs: Not on file  Physical Activity: Not on file   Stress: Not on file  Social Connections: Unknown (11/30/2021)   [facility] from [facility]   Social Network    Social Network: Not on file  Intimate Partner Violence: Unknown (11/03/2021)   Received from [employer], [facility]   HITS    Physically Hurt: Not on file    Insult or Talk Down To: Not on file    Threaten Physical Harm: Not on file    Scream or Curse: Not on file     BP 112/82   Pulse 60   Ht 5\' 4"  (1.626 m)   Wt 128 lb (58.1 kg)   LMP 10/15/2023   SpO2 98%   BMI 21.97 kg/m   Physical Exam:  Well appearing NAD HEENT: Unremarkable Neck:  No JVD, no thyromegally Lymphatics:  No adenopathy Back:  No CVA tenderness Lungs:  Clear with no wheezes HEART:  Regular rate rhythm, no murmurs, no rubs, no clicks Abd:  soft, positive bowel sounds, no organomegally, no rebound, no guarding Ext:  2 plus pulses, no edema, no cyanosis, no clubbing Skin:  No rashes no nodules Neuro:  CN II through XII intact, motor grossly intact  DEVICE  Normal device function.  See PaceArt for details.   Assess/Plan:  1.Congenital long QT - she has started back on Nadolol . I asked her to uptitrate to 20 mg in the morning and 10 mg in the evening.  2. ICD - Her [place] S-ICD is working normally.   Pete Brand Mazen Marcin,MD

## 2023-11-18 NOTE — Patient Instructions (Addendum)
 Medication Instructions:  Your physician has recommended you make the following change in your medication:  Increase nadolol  20 mg in the morning and 10 mg in the evening.  Lab Work: None ordered.  You may go to any [facility] Location for your lab work:  [facility] - [address] ([facility]) - [address] [phone] [address]  [place] - [address]   [place]  - [address]   [place] - [address] - [address] [employer]  - [address] ([facility]   If you have labs (blood work) drawn today and your tests are completely normal, you will receive your results only by: [employer] (if you have MyChart)  If you have any lab test that is abnormal or we need to change your treatment, we will call you or send a MyChart message to review the results.  Testing/Procedures: None ordered.  Follow-Up: At [facility], you and your health needs are our priority.  As part of our continuing mission to provide you with exceptional heart care, we have created designated Provider Care Teams.  These Care Teams include your primary Cardiologist (physician) and Advanced Practice Providers (APPs -  Physician Assistants and Nurse Practitioners) who all work together to provide you with the care you need, when you need it.  Your next appointment:   1 year(s)  The format for your next appointment:   In Person  Provider:   Manya Sells, MD{or one of the following Advanced Practice Providers on your designated Care Team:   Mertha Abrahams, [place] Bambi Lever "Jonelle Neri" [place], [place] Neda Balk, NP  Note: Remote monitoring is used to monitor your Pacemaker/ ICD from home. This monitoring reduces the number of office visits required to check your device to one time per year. It allows us  to keep an eye on the functioning of your device to ensure it is  working properly.            Valet parking services will be available as well.

## 2023-11-23 ENCOUNTER — Telehealth: Payer: Self-pay | Admitting: Internal Medicine

## 2023-11-23 NOTE — Telephone Encounter (Signed)
*  STAT* If patient is at the pharmacy, call can be transferred to refill team.   1. Which medications need to be refilled? (please list name of each medication and dose if known) nadolol  (CORGARD ) 20 MG tablet    2. Would you like to learn more about the convenience, safety, & potential cost savings by using the [facility]?     3. Are you open to using the [facility] (Type [facility].    4. Which pharmacy/location (including street and city if local pharmacy) is medication to be sent to?[id number] - [place],  - [address] AT [address]     5. Do they need a 30 day or 90 day supply? 90

## 2023-11-23 NOTE — Telephone Encounter (Signed)
 RX sent in on 11/18/23

## 2023-11-25 NOTE — Telephone Encounter (Signed)
 Letter has been discarded since patient hasn't picked it from our office.

## 2023-12-19 ENCOUNTER — Ambulatory Visit (INDEPENDENT_AMBULATORY_CARE_PROVIDER_SITE_OTHER): Payer: BC Managed Care – PPO

## 2023-12-19 DIAGNOSIS — I4581 Long QT syndrome: Secondary | ICD-10-CM | POA: Diagnosis not present

## 2023-12-21 LAB — CUP PACEART REMOTE DEVICE CHECK
Battery Remaining Percentage: 65 %
Date Time Interrogation Session: 20250521095600
HighPow Impedance: 65 Ohm
Implantable Lead Connection Status: 753985
Implantable Lead Implant Date: 20150521
Implantable Lead Location: 753862
Implantable Lead Model: 3400
Implantable Pulse Generator Implant Date: 20220401
Pulse Gen Serial Number: 157706

## 2023-12-28 ENCOUNTER — Ambulatory Visit: Payer: Self-pay | Admitting: Cardiovascular Disease

## 2024-01-17 ENCOUNTER — Telehealth: Payer: Self-pay | Admitting: Cardiovascular Disease

## 2024-01-17 ENCOUNTER — Encounter: Payer: Self-pay | Admitting: Internal Medicine

## 2024-01-17 NOTE — Telephone Encounter (Signed)
 Patient identification verified by 2 forms. Sims Duck, RN     Called and spoke to patient  Patient states:  - She wanted to know if any of these medications were safe to take as a cardiac patient.  - She is aware that she will need to contact PCP or Psychologist for management/follow up.                Interventions/Plan: -  Offered patient assistance with additional resources for noted depression if she was having trouble locating a provider or needed help with scheduling an appointment.  Patient declined stating she has the appropriate help she needs and does not require help with additional resources at this time.   - Encounter forwarded to primary cardiologist and and pharmacy team for review/recommendations.     Patient agrees with plan, no questions at this time

## 2024-01-17 NOTE — Telephone Encounter (Signed)
 Patient identification verified by 2 forms. Sims Duck, RN     See mychart encounter from today for further details/followup. This encounter closed.

## 2024-01-17 NOTE — Telephone Encounter (Signed)
 Pt wants to know if there are any medications that she can take for depression with her heart comditions

## 2024-01-18 ENCOUNTER — Other Ambulatory Visit: Payer: Self-pay | Admitting: Internal Medicine

## 2024-01-18 ENCOUNTER — Encounter: Payer: Self-pay | Admitting: Internal Medicine

## 2024-01-18 DIAGNOSIS — F330 Major depressive disorder, recurrent, mild: Secondary | ICD-10-CM | POA: Insufficient documentation

## 2024-01-18 MED ORDER — DULOXETINE HCL 30 MG PO CPEP: 30.0000 mg | ORAL_CAPSULE | Freq: Every day | ORAL | 0 refills | Status: DC

## 2024-01-24 ENCOUNTER — Encounter: Payer: Self-pay | Admitting: Pharmacy Technician

## 2024-01-24 ENCOUNTER — Other Ambulatory Visit (HOSPITAL_COMMUNITY): Payer: Self-pay

## 2024-01-24 ENCOUNTER — Telehealth: Payer: Self-pay | Admitting: Pharmacy Technician

## 2024-01-24 NOTE — Telephone Encounter (Signed)
 Pharmacy Patient Advocate Encounter  Received notification from [facility] MEDICAID that Prior Authorization for Nadolol  20mg  has been APPROVED from 01/24/24 to 01/23/25 sent patient a mychart message   PA #/Case ID/Reference #: [id number]

## 2024-01-24 NOTE — Telephone Encounter (Signed)
   Pharmacy Patient Advocate Encounter   Received notification from Onbase that prior authorization for nadolol  20mg  is required/requested.   Insurance verification completed.   The patient is insured through [facility] MEDICAID .   Per test claim: PA required; PA submitted to above mentioned insurance via CoverMyMeds Key/confirmation #/EOC [id number] Status is pending

## 2024-01-26 NOTE — Progress Notes (Signed)
 01/27/2024 Courtney Rice [id number] Mar 18, 1991  Referring provider: Joshua Debby CROME, MD Primary GI doctor: Dr. San  ASSESSMENT AND PLAN:  Chronic constipation Torturous colon March 2024 recall 7 years Without linzess  she does not have a BM for up to a week, can be hard pellets or soft broken stool, never formed bristol 4 stool With the linzess  290 she has extensive diarrhea and can not leave the house which is concerning for trying to find a job Has nausea, get early satiety, bloating, no weight loss - will do bowel prep and trial of isbrella, if this does not help than can do trulance - possible global paresis, consider motegrity - check SIBO  - check celiac, check H pylori - consider sitz markers versus mantometry  QT prolongation syndrome status post AICD placement 2015 Had episode of VT in movement with AICD being triggered  Personal history of tubular adenomatous polyps Colonoscopy March 2024 showed tortuous colon, 2 tubular adenomas that were 3 to 4 mm in size with the recommended repeat in 7 years   Patient Care Team: Joshua Debby CROME, MD as PCP - General (Internal Medicine) Waddell Danelle ORN, MD as PCP - Electrophysiology (Cardiology)  HISTORY OF PRESENT ILLNESS: 33 y.o. female with a past medical history listed below presents for evaluation of constipation.   Discussed the use of AI scribe software for clinical note transcription with the patient, who gave verbal consent to proceed.  History of Present Illness   Courtney Rice is a 33 year old female with a history of tortuous colon and adenomatous polyps who presents with chronic constipation and abdominal discomfort.  She experiences chronic constipation, which she attributes to her tortuous colon. Bowel movements are infrequent without medication, often resulting in small, hard or soft pieces rather than formed stools. Linzess  initially provided relief but now causes diarrhea when effective, making  it unsuitable for use during work. She experiences significant abdominal bloating and discomfort, which sometimes improves after a bowel movement. She also reports nausea and a sensation of fullness after eating small amounts, stating 'I still eat like a kid pretty much.' Despite these symptoms, her weight remains stable around 125-130 pounds. No dark or black stools and no significant weight loss. Occasionally experiences heartburn but not frequently.  Her past medical history includes a colonoscopy performed in March 2024, which revealed two small adenomatous polyps and a tortuous colon. She has a history of QT prolongation and has an AICD. She experienced a cardiac arrest episode in November 2024, during which her AICD activated. She attributes this episode to stress and reports consistent chest pain and shortness of breath as symptoms of her heart condition. She is currently on Nadolol , which she started in November 2024.  Her family history includes her mother, who is also a patient at the same clinic and has stomach issues.      She  reports that she has never smoked. She has never used smokeless tobacco. She reports that she does not drink alcohol and does not use drugs.  RELEVANT GI HISTORY, IMAGING AND LABS: Results   DIAGNOSTIC Colonoscopy: Two small adenomatous polyps, tortuous colon, no evidence of inflammatory bowel disease (10/2022)      CBC    Component Value Date/Time   WBC 4.1 06/20/2023 0800   RBC 4.62 06/20/2023 0800   HGB 14.1 06/20/2023 0800   HGB 14.2 10/02/2020 1322   HCT 42.1 06/20/2023 0800   HCT 40.6 10/02/2020 1322   PLT 231  06/20/2023 0800   PLT 228 10/02/2020 1322   MCV 91.1 06/20/2023 0800   MCV 90 10/02/2020 1322   MCH 30.5 06/20/2023 0800   MCHC 33.5 06/20/2023 0800   RDW 11.9 06/20/2023 0800   RDW 12.1 10/02/2020 1322   LYMPHSABS 2.8 08/18/2022 1103   LYMPHSABS 2.6 10/02/2020 1322   MONOABS 0.5 08/18/2022 1103   EOSABS 0.1 08/18/2022 1103   EOSABS  0.1 10/02/2020 1322   BASOSABS 0.0 08/18/2022 1103   BASOSABS 0.1 10/02/2020 1322   Recent Labs    06/20/23 0800  HGB 14.1    CMP     Component Value Date/Time   NA 139 10/25/2023 1411   NA 141 10/02/2020 1322   K 4.2 10/25/2023 1411   CL 104 10/25/2023 1411   CO2 31 10/25/2023 1411   GLUCOSE 93 10/25/2023 1411   BUN 8 10/25/2023 1411   BUN 7 10/02/2020 1322   CREATININE 0.61 10/25/2023 1411   CALCIUM 9.6 10/25/2023 1411   PROT 7.4 08/18/2022 1103   ALBUMIN 4.5 08/18/2022 1103   AST 20 08/18/2022 1103   ALT 16 08/18/2022 1103   ALKPHOS 46 08/18/2022 1103   BILITOT 0.4 08/18/2022 1103   GFRNONAA >60 06/20/2023 0800   GFRAA 137 05/26/2017 0952      Latest Ref Rng & Units 08/18/2022   11:03 AM 08/01/2015    6:40 PM 04/10/2013    5:02 PM  Hepatic Function  Total Protein 6.0 - 8.3 g/dL 7.4  6.6  7.7   Albumin 3.5 - 5.2 g/dL 4.5  3.8  4.0   AST 0 - 37 U/L 20  17  16    ALT 0 - 35 U/L 16  13  10    Alk Phosphatase 39 - 117 U/L 46  49  41   Total Bilirubin 0.2 - 1.2 mg/dL 0.4  0.6  0.2   Bilirubin, Direct 0.0 - 0.3 mg/dL 0.1         Current Medications:    Current Outpatient Medications (Cardiovascular):    nadolol  (CORGARD ) 20 MG tablet, Take 20mg  in the morning and 10mg  in the evening.   Current Outpatient Medications (Analgesics):    ibuprofen  (ADVIL ) 800 MG tablet, TAKE 1 TABLET(800 MG) BY MOUTH EVERY 8 HOURS AS NEEDED   Rimegepant Sulfate (NURTEC) 75 MG TBDP, Take 1 tablet (75 mg total) by mouth every other day as needed.   Rimegepant Sulfate (NURTEC) 75 MG TBDP, Take 1 tablet (75 mg total) by mouth every other day.   Current Outpatient Medications (Other):    dicyclomine  (BENTYL ) 10 MG capsule, Take 1 capsule (10 mg total) by mouth every 6 (six) hours as needed for spasms.   DULoxetine  (CYMBALTA ) 30 MG capsule, Take 1 capsule (30 mg total) by mouth daily.   linaclotide  (LINZESS ) 290 MCG CAPS capsule, Take 1 capsule (290 mcg total) by mouth daily before  breakfast.  Medical History:  Past Medical History:  Diagnosis Date   AICD (automatic cardioverter/defibrillator) present    Long Q-T syndrome Type 2    a. post partum syncope and VT PM;  b. 11/2013 s/p BSX SubQ ICD placement.   Migraine    Pre-syncope admitted 08/01/2015   a. felt to be 2/2 orthostatic hypotension - propranolol  decreased.    Allergies: No Known Allergies   Surgical History:  She  has a past surgical history that includes Implantable cardioverter defibrillator implant (12-20-2013); implantable cardioverter defibrillator implant (N/A, 12/20/2013); Tonsillectomy and adenoidectomy (2011); and SUBQ ICD CHANGEOUT (  N/A, 10/31/2020). Family History:  Her family history includes Asthma in her mother; Breast cancer in her maternal aunt; COPD in her maternal grandmother and mother; Cervical cancer in her mother; Colon polyps in her mother; Cystic fibrosis in her mother; Diabetes in her mother; Fibroids in her mother; Fibromyalgia in her mother; Heart disease in her maternal grandmother and mother; Hypertension in her mother; Irritable bowel syndrome in her mother; Kidney disease in her mother; Liver disease in her mother; Long QT syndrome in her son; Lupus in her mother; Sarcoidosis in her mother; Stroke in her mother.  REVIEW OF SYSTEMS  : All other systems reviewed and negative except where noted in the History of Present Illness.  PHYSICAL EXAM: BP 130/82   Pulse 71   Ht 5' 4 (1.626 m)   Wt 130 lb (59 kg)   BMI 22.31 kg/m  Physical Exam   MEASUREMENTS: Weight- 125-130. GENERAL APPEARANCE: Well nourished, in no apparent distress HEENT: No cervical lymphadenopathy, unremarkable thyroid , sclerae anicteric, conjunctiva pink RESPIRATORY: Respiratory effort normal, BS equal bilateral without rales, rhonchi, wheezing CARDIO: RRR with no MRGs, peripheral pulses intact ABDOMEN: Soft, non distended, active bowel sounds in all 4 quadrants, no tenderness to palpation, no rebound, no mass  appreciated RECTAL: declines MUSCULOSKELETAL: Full ROM, normal gait, without edema SKIN: Dry, intact without rashes or lesions. No jaundice. NEURO: Alert, oriented, no focal deficits PSYCH: Cooperative, normal mood and affect.      Alan JONELLE Coombs, PA-C 11:55 AM

## 2024-01-27 ENCOUNTER — Encounter: Payer: Self-pay | Admitting: Physician Assistant

## 2024-01-27 ENCOUNTER — Other Ambulatory Visit

## 2024-01-27 ENCOUNTER — Ambulatory Visit (INDEPENDENT_AMBULATORY_CARE_PROVIDER_SITE_OTHER): Admitting: Physician Assistant

## 2024-01-27 VITALS — BP 130/82 | HR 71 | Ht 64.0 in | Wt 130.0 lb

## 2024-01-27 DIAGNOSIS — Z860101 Personal history of adenomatous and serrated colon polyps: Secondary | ICD-10-CM

## 2024-01-27 DIAGNOSIS — K5904 Chronic idiopathic constipation: Secondary | ICD-10-CM

## 2024-01-27 DIAGNOSIS — I4581 Long QT syndrome: Secondary | ICD-10-CM

## 2024-01-27 DIAGNOSIS — D125 Benign neoplasm of sigmoid colon: Secondary | ICD-10-CM

## 2024-01-27 DIAGNOSIS — R140 Abdominal distension (gaseous): Secondary | ICD-10-CM | POA: Diagnosis not present

## 2024-01-27 DIAGNOSIS — K5909 Other constipation: Secondary | ICD-10-CM

## 2024-01-27 NOTE — Patient Instructions (Addendum)
 _______________________________________________________  If your blood pressure at your visit was 140/90 or greater, please contact your primary care physician to follow up on this.  _______________________________________________________  If you are age 32 or older, your body mass index should be between 23-30. Your Body mass index is 22.31 kg/m. If this is out of the aforementioned range listed, please consider follow up with your Primary Care Provider.  If you are age 40 or younger, your body mass index should be between 19-25. Your Body mass index is 22.31 kg/m. If this is out of the aformentioned range listed, please consider follow up with your Primary Care Provider.   ________________________________________________________  The [facility] providers would like to encourage you to use MYCHART to communicate with providers for non-urgent requests or questions.  Due to long hold times on the telephone, sending your provider a message by [facility] may be a faster and more efficient way to get a response.  Please allow 48 business hours for a response.  Please remember that this is for non-urgent requests.  _______________________________________________________  Your provider has requested that you go to the basement level for lab work before leaving today. Press B on the elevator. The lab is located at the first door on the left as you exit the elevator.  You have been scheduled for an appointment with Alan Coombs on 03-29-24 at 1040am . Please arrive 10 minutes early for your appointment.  Please do the following: Purchase a bottle of Miralax over the counter as well as a box of 5 mg dulcolax tablets. Take 4 dulcolax tablets. Wait 1 hour. You will then drink 6-8 capfuls of Miralax mixed in an adequate amount of water/juice/gatorade (you may choose which of these liquids to drink) over the next 2-3 hours. You should expect results within 1 to 6 hours after completing the bowel  purge. Go to the er if you have severe AB pain, can not pass gas or stool in over 12 hours, can not hold down any food.    Can do trial of isbrela for IBS constipation AFTER THE BOWEL PURGE Take Ibsrela (tenapanor) 5 to 10 minutes before eating breakfast and dinner. This can make the medication work better for you compared to taking it on an empty stomach Stop if you have any significant diarrhea/dehydration.  If this does not help can consider trulance 3 mg daily OR motegrity  Toileting tips to help with your constipation - Drink at least 64-80 ounces of water/liquid per day. - Establish a time to try to move your bowels every day.  For many people, this is after a cup of coffee or after a meal such as breakfast. - Sit all of the way back on the toilet keeping your back fairly straight and while sitting up, try to rest the tops of your forearms on your upper thighs.   - Raising your feet with a step stool/squatty potty can be helpful to improve the angle that allows your stool to pass through the rectum. - Relax the rectum feeling it bulge toward the toilet water.  If you feel your rectum raising toward your body, you are contracting rather than relaxing. - Breathe in and slowly exhale. Belly breath by expanding your belly towards your belly button. Keep belly expanded as you gently direct pressure down and back to the anus.  A low pitched GRRR sound can assist with increasing intra-abdominal pressure.  (Can also trying to blow on a pinwheel and make it move, this helps with  the same belly breathing) - Repeat 3-4 times. If unsuccessful, contract the pelvic floor to restore normal tone and get off the toilet.  Avoid excessive straining. - To reduce excessive wiping by teaching your anus to normally contract, place hands on outer aspect of knees and resist knee movement outward.  Hold 5-10 second then place hands just inside of knees and resist inward movement of knees.  Hold 5 seconds.  Repeat a  few times each way.  Go to the ER if unable to pass gas, severe AB pain, unable to hold down food, any shortness of breath of chest pain.   Small intestinal bacterial overgrowth (SIBO) occurs when there is an abnormal increase in the overall bacterial population in the small intestine -- particularly types of bacteria not commonly found in that part of the digestive tract. Small intestinal bacterial overgrowth (SIBO) commonly results when a circumstance -- such as surgery or disease -- slows the passage of food and waste products in the digestive tract, creating a breeding ground for bacteria.  Signs and symptoms of SIBO often include: Loss of appetite Abdominal pain Nausea Bloating An uncomfortable feeling of fullness after eating Diarrhea or constipation, depending on the type of gas produced  What foods trigger SIBO? While foods aren't the original cause of SIBO, certain foods do encourage the overgrowth of the wrong bacteria in your small intestine. If you're feeding them their favorite foods, they're going to grow more, and that will trigger more of your SIBO symptoms. By the same token, you can help reduce the overgrowth by starving the problematic bacteria of their favorite foods. This strategy has led to a number of proposed SIBO eating plans. The plans vary, and so do individual results. But in general, they tend to recommend limiting carbohydrates.  These include: Sugars and sweeteners. Fruits and starchy vegetables. Dairy products. Grains.  There is a test for this we can do called a breath test, if you are positive we will treat you with an antibiotic to see if it helps.  Your symptoms are very suspicious for this condition, as discussed, we will start you on an antibiotic to see if this helps.

## 2024-01-28 LAB — IGA: Immunoglobulin A: 147 mg/dL (ref 47–310)

## 2024-01-28 LAB — TISSUE TRANSGLUTAMINASE, IGA: (tTG) Ab, IgA: <1 U/mL

## 2024-01-29 ENCOUNTER — Ambulatory Visit: Payer: Self-pay | Admitting: Physician Assistant

## 2024-01-31 ENCOUNTER — Encounter: Payer: Self-pay | Admitting: Obstetrics

## 2024-01-31 ENCOUNTER — Other Ambulatory Visit (HOSPITAL_COMMUNITY)
Admission: RE | Admit: 2024-01-31 | Discharge: 2024-01-31 | Disposition: A | Discharge status: Home / Self Care | Source: Ambulatory Visit | Attending: Obstetrics | Admitting: Obstetrics

## 2024-01-31 ENCOUNTER — Ambulatory Visit: Admitting: Obstetrics

## 2024-01-31 VITALS — BP 100/63 | HR 60 | Ht 64.0 in | Wt 132.6 lb

## 2024-01-31 DIAGNOSIS — N946 Dysmenorrhea, unspecified: Secondary | ICD-10-CM | POA: Diagnosis not present

## 2024-01-31 DIAGNOSIS — N898 Other specified noninflammatory disorders of vagina: Secondary | ICD-10-CM | POA: Diagnosis present

## 2024-01-31 DIAGNOSIS — Z3009 Encounter for other general counseling and advice on contraception: Secondary | ICD-10-CM

## 2024-01-31 DIAGNOSIS — Z113 Encounter for screening for infections with a predominantly sexual mode of transmission: Secondary | ICD-10-CM

## 2024-01-31 DIAGNOSIS — Z01419 Encounter for gynecological examination (general) (routine) without abnormal findings: Secondary | ICD-10-CM | POA: Diagnosis not present

## 2024-01-31 MED ORDER — IBUPROFEN 800 MG PO TABS: 800.0000 mg | ORAL_TABLET | Freq: Three times a day (TID) | ORAL | 5 refills | Status: DC | PRN

## 2024-01-31 NOTE — Progress Notes (Signed)
 Subjective:        Courtney Rice is a 33 y.o. female here for a routine exam.  Current complaints: Vaginal discharge.    Personal health questionnaire:  Is patient Ashkenazi Jewish, have a family history of breast and/or ovarian cancer: no Is there a family history of uterine cancer diagnosed at age < 73, gastrointestinal cancer, urinary tract cancer, family member who is a Personnel officer syndrome-associated carrier: no Is the patient overweight and hypertensive, family history of diabetes, personal history of gestational diabetes, preeclampsia or PCOS: no Is patient over 66, have PCOS,  family history of premature CHD under age 66, diabetes, smoke, have hypertension or peripheral artery disease:  no At any time, has a partner hit, kicked or otherwise hurt or frightened you?: no Over the past 2 weeks, have you felt down, depressed or hopeless?: no Over the past 2 weeks, have you felt little interest or pleasure in doing things?:no   Gynecologic History Patient's last menstrual period was 01/17/2024 (approximate). Contraception: none Last Pap: 2023. Results were: normal Last mammogram: n/a. Results were: n/a  Obstetric History OB History  Gravida Para Term Preterm AB Living  1 1 1   1   SAB IAB Ectopic Multiple Live Births      1    # Outcome Date GA Lbr Len/2nd Weight Sex Type Anes PTL Lv  1 Term 10/25/13 [redacted]w[redacted]d 06:09 / 00:21 6 lb 9.4 oz (2.988 kg) M Vag-Spont EPI, Local  LIV    Past Medical History:  Diagnosis Date   AICD (automatic cardioverter/defibrillator) present    Long Q-T syndrome Type 2    a. post partum syncope and VT PM;  b. 11/2013 s/p BSX SubQ ICD placement.   Migraine    Pre-syncope admitted 08/01/2015   a. felt to be 2/2 orthostatic hypotension - propranolol  decreased.     Past Surgical History:  Procedure Laterality Date   IMPLANTABLE CARDIOVERTER DEFIBRILLATOR IMPLANT  12/20/2013   BSX SubQ ICD implanted by Dr Fernande   IMPLANTABLE CARDIOVERTER  DEFIBRILLATOR IMPLANT N/A 12/20/2013   Procedure: SUB Q ICD;  Surgeon: Elspeth JAYSON Fernande, MD;  Location: [facility] CATH LAB;  Service: Cardiovascular;  Laterality: N/A;   polyp removal  2024   SUBQ ICD CHANGEOUT N/A 10/31/2020   Procedure: SUBQ ICD CHANGEOUT;  Surgeon: Fernande Elspeth JAYSON, MD;  Location: [facility] INVASIVE CV LAB;  Service: Cardiovascular;  Laterality: N/A;   TONSILLECTOMY AND ADENOIDECTOMY  08/02/2009     Current Outpatient Medications:    ibuprofen  (ADVIL ) 800 MG tablet, TAKE 1 TABLET(800 MG) BY MOUTH EVERY 8 HOURS AS NEEDED, Disp: 30 tablet, Rfl: 0   ibuprofen  (ADVIL ) 800 MG tablet, Take 1 tablet (800 mg total) by mouth every 8 (eight) hours as needed., Disp: 30 tablet, Rfl: 5   nadolol  (CORGARD ) 20 MG tablet, Take 20mg  in the morning and 10mg  in the evening., Disp: 135 tablet, Rfl: 3   Rimegepant Sulfate (NURTEC) 75 MG TBDP, Take 1 tablet (75 mg total) by mouth every other day as needed., Disp: 46 tablet, Rfl: 1   dicyclomine  (BENTYL ) 10 MG capsule, Take 1 capsule (10 mg total) by mouth every 6 (six) hours as needed for spasms. (Patient not taking: Reported on 01/31/2024), Disp: 30 capsule, Rfl: 3   DULoxetine  (CYMBALTA ) 30 MG capsule, Take 1 capsule (30 mg total) by mouth daily. (Patient not taking: Reported on 01/31/2024), Disp: 30 capsule, Rfl: 0   linaclotide  (LINZESS ) 290 MCG CAPS capsule, Take 1 capsule (290 mcg total)  by mouth daily before breakfast. (Patient not taking: Reported on 01/31/2024), Disp: 90 capsule, Rfl: 1   Rimegepant Sulfate (NURTEC) 75 MG TBDP, Take 1 tablet (75 mg total) by mouth every other day. (Patient not taking: Reported on 01/31/2024), Disp: , Rfl:  No Known Allergies  Social History   Tobacco Use   Smoking status: Never   Smokeless tobacco: Never  Substance Use Topics   Alcohol use: No    Family History  Problem Relation Age of Onset   Heart disease Mother    Diabetes Mother    Asthma Mother    COPD Mother    Lupus Mother    Stroke Mother    Hypertension  Mother    Colon polyps Mother    Fibroids Mother    Kidney disease Mother    Fibromyalgia Mother    Sarcoidosis Mother    Cervical cancer Mother    Irritable bowel syndrome Mother    Liver disease Mother    Cystic fibrosis Mother    COPD Maternal Grandmother    Heart disease Maternal Grandmother    Breast cancer Maternal Aunt    Long QT syndrome Son    Hearing loss Neg Hx    Heart attack Neg Hx    Colon cancer Neg Hx    Stomach cancer Neg Hx       Review of Systems  Constitutional: negative for fatigue and weight loss Respiratory: negative for cough and wheezing Cardiovascular: negative for chest pain, fatigue and palpitations Gastrointestinal: negative for abdominal pain and change in bowel habits Musculoskeletal:negative for myalgias Neurological: negative for gait problems and tremors Behavioral/Psych: negative for abusive relationship, depression Endocrine: negative for temperature intolerance    Genitourinary:negative for abnormal menstrual periods, genital lesions, hot flashes, sexual problems and vaginal discharge Integument/breast: negative for breast lump, breast tenderness, nipple discharge and skin lesion(s)    Objective:       BP 100/63   Pulse 60   Ht 5' 4 (1.626 m)   Wt 132 lb 9.6 oz (60.1 kg)   LMP 01/17/2024 (Approximate)   BMI 22.76 kg/m  General:   alert  Skin:   no rash or abnormalities  Lungs:   clear to auscultation bilaterally  Heart:   regular rate and rhythm, S1, S2 normal, no murmur, click, rub or gallop  Breasts:   normal without suspicious masses, skin or nipple changes or axillary nodes  Abdomen:  normal findings: no organomegaly, soft, non-tender and no hernia  Pelvis:  External genitalia: normal general appearance Urinary system: urethral meatus normal and bladder without fullness, nontender Vaginal: normal without tenderness, induration or masses Cervix: normal appearance Adnexa: normal bimanual exam Uterus: anteverted and  non-tender, normal size   Lab Review Urine pregnancy test Labs reviewed yes Radiologic studies reviewed no  I have spent a total of 20 minutes of face-to-face and non-face-to-face time, excluding clinical staff time, reviewing notes and preparing to see patient, ordering tests and/or medications, and counseling the patient.   Assessment:    1. Encounter for gynecological examination with Papanicolaou smear of cervix (Primary) Rx: - Cytology - PAP  2. Dysmenorrhea Rx: - ibuprofen  (ADVIL ) 800 MG tablet; Take 1 tablet (800 mg total) by mouth every 8 (eight) hours as needed.  Dispense: 30 tablet; Refill: 5  3. Vaginal discharge Rx: - Cervicovaginal ancillary only  4. Screening for STD (sexually transmitted disease) Rx: - Hepatitis B surface antigen - Hepatitis C antibody - HIV Antibody (routine testing w rflx) - RPR  5. Encounter for other general counseling or advice on contraception - options discussed.  Declines contraception. - condoms recommended for STD prevention     Plan:    Education reviewed: calcium supplements, depression evaluation, low fat, low cholesterol diet, safe sex/STD prevention, self breast exams, and weight bearing exercise. Contraception: none. Follow up in: 1 year.   Meds ordered this encounter  Medications   ibuprofen  (ADVIL ) 800 MG tablet    Sig: Take 1 tablet (800 mg total) by mouth every 8 (eight) hours as needed.    Dispense:  30 tablet    Refill:  5   Orders Placed This Encounter  Procedures   Hepatitis B surface antigen   Hepatitis C antibody   HIV Antibody (routine testing w rflx)   RPR    CARLIN RONAL CENTERS, MD, FACOG Attending Obstetrician & Gynecologist, [facility], [facility], [place] 01/31/2024

## 2024-01-31 NOTE — Progress Notes (Signed)
 Presents for annual exam. Requests all STI testing. Would like a refill of ibuprofen . Had colonoscopy last year; had 2 polyps removed. No questions or concerns.

## 2024-02-01 LAB — RPR: RPR Ser Ql: NONREACTIVE

## 2024-02-01 LAB — HEPATITIS B SURFACE ANTIGEN: Hepatitis B Surface Ag: NEGATIVE

## 2024-02-01 LAB — HEPATITIS C ANTIBODY: Hep C Virus Ab: NONREACTIVE

## 2024-02-01 LAB — HIV ANTIBODY (ROUTINE TESTING W REFLEX): HIV Screen 4th Generation wRfx: NONREACTIVE

## 2024-02-02 LAB — CERVICOVAGINAL ANCILLARY ONLY
Bacterial Vaginitis (gardnerella): NEGATIVE
Candida Glabrata: NEGATIVE
Candida Vaginitis: NEGATIVE
Chlamydia: NEGATIVE
Comment: NEGATIVE
Comment: NEGATIVE
Comment: NEGATIVE
Comment: NEGATIVE
Comment: NEGATIVE
Comment: NORMAL
Neisseria Gonorrhea: NEGATIVE
Trichomonas: NEGATIVE

## 2024-02-05 ENCOUNTER — Encounter (HOSPITAL_COMMUNITY): Payer: Self-pay

## 2024-02-05 ENCOUNTER — Ambulatory Visit (HOSPITAL_COMMUNITY)
Admission: EM | Admit: 2024-02-05 | Discharge: 2024-02-05 | Disposition: A | Discharge status: Home / Self Care | Attending: Emergency Medicine | Admitting: Emergency Medicine

## 2024-02-05 DIAGNOSIS — L509 Urticaria, unspecified: Secondary | ICD-10-CM

## 2024-02-05 MED ORDER — TRIAMCINOLONE ACETONIDE 0.1 % EX CREA
1.0000 | TOPICAL_CREAM | Freq: Two times a day (BID) | CUTANEOUS | 0 refills | Status: DC
Start: 2024-02-05 — End: 2024-03-29

## 2024-02-05 MED ORDER — PREDNISONE 20 MG PO TABS: 40.0000 mg | ORAL_TABLET | Freq: Every day | ORAL | 0 refills | Status: DC

## 2024-02-05 MED ORDER — TRIAMCINOLONE ACETONIDE 0.1 % EX CREA: 1.0000 | TOPICAL_CREAM | Freq: Two times a day (BID) | CUTANEOUS | 0 refills | Status: DC

## 2024-02-05 MED ORDER — PREDNISONE 20 MG PO TABS: 40.0000 mg | ORAL_TABLET | Freq: Every day | ORAL | 0 refills | Status: AC

## 2024-02-05 NOTE — Discharge Instructions (Signed)
 Start taking 2 tablets of prednisone  once daily for 3 days. Apply triamcinolone  cream twice daily to the affected areas. Take over-the-counter cetirizine (Zyrtec) or loratadine (Claritin) once daily to help with your rash as well. I have attached an allergy specialist you can follow-up with for further evaluation management of this. Follow-up with your primary care provider or return here as needed.

## 2024-02-05 NOTE — ED Provider Notes (Signed)
 [facility]    CSN: [id number] Arrival date & time: 02/05/24  1549      History   Chief Complaint Chief Complaint  Patient presents with   Urticaria    HPI Courtney Rice is a 33 y.o. female.   Patient presents with itchy hives to bilateral legs x 5 days.  Patient denies any new foods, medications, or products that she can think of.  Denies any known insect bites.  Denies being around anybody else with similar symptoms.  Patient states that he has been applying cortisone cream and taking Benadryl  with minimal relief.   Patient states that she has had hives to her legs on and off for a few months now.  Patient states that she is unsure what she could be exposing herself to that she could possibly be allergic to.  Patient states that she normally applies cortisone cream and takes Benadryl  with relief, but states that this has not been helping this time.  Patient states that the rash never goes beyond her legs.  The history is provided by the patient and medical records.  Urticaria    Past Medical History:  Diagnosis Date   AICD (automatic cardioverter/defibrillator) present    Long Q-T syndrome Type 2    a. post partum syncope and VT PM;  b. 11/2013 s/p BSX SubQ ICD placement.   Migraine    Pre-syncope admitted 08/01/2015   a. felt to be 2/2 orthostatic hypotension - propranolol  decreased.     Patient Active Problem List   Diagnosis Date Noted   Mild episode of recurrent major depressive disorder (HCC) 01/18/2024   Chronic migraine with aura without status migrainosus, not intractable 10/25/2023   Encounter for general adult medical examination with abnormal findings 10/25/2023   Immunization due 10/25/2023   Vitamin D  deficiency 10/25/2023   Constipation 06/15/2023   Patellar tendonitis of both knees 08/18/2022   Current moderate episode of major depressive disorder without prior episode (HCC) 10/08/2021   ICD (implantable cardioverter-defibrillator) in  place 02/16/2021   HGSIL (high grade squamous intraepithelial lesion) on Pap smear of cervix 09/06/2019   LGSIL on Pap smear of cervix 07/12/2018   Polymorphic ventricular tachycardia (HCC) 11/30/2013   Long Q-T syndrome 06/06/2013    Past Surgical History:  Procedure Laterality Date   IMPLANTABLE CARDIOVERTER DEFIBRILLATOR IMPLANT  12/20/2013   BSX SubQ ICD implanted by Dr Fernande   IMPLANTABLE CARDIOVERTER DEFIBRILLATOR IMPLANT N/A 12/20/2013   Procedure: SUB Q ICD;  Surgeon: Elspeth JAYSON Fernande, MD;  Location: [facility] CATH LAB;  Service: Cardiovascular;  Laterality: N/A;   polyp removal  2024   SUBQ ICD CHANGEOUT N/A 10/31/2020   Procedure: SUBQ ICD CHANGEOUT;  Surgeon: Fernande Elspeth JAYSON, MD;  Location: [facility] INVASIVE CV LAB;  Service: Cardiovascular;  Laterality: N/A;   TONSILLECTOMY AND ADENOIDECTOMY  08/02/2009    OB History     Gravida  1   Para  1   Term  1   Preterm      AB      Living  1      SAB      IAB      Ectopic      Multiple      Live Births  1            Home Medications    Prior to Admission medications   Medication Sig Start Date End Date Taking? Authorizing Provider  dicyclomine  (BENTYL ) 10 MG capsule Take 1 capsule (10  mg total) by mouth every 6 (six) hours as needed for spasms. 06/15/23  Yes Zehr, Jessica D, PA-C  DULoxetine  (CYMBALTA ) 30 MG capsule Take 1 capsule (30 mg total) by mouth daily. 01/18/24  Yes Joshua Debby CROME, MD  ibuprofen  (ADVIL ) 800 MG tablet Take 1 tablet (800 mg total) by mouth every 8 (eight) hours as needed. 01/31/24  Yes Rudy Carlin LABOR, MD  nadolol  (CORGARD ) 20 MG tablet Take 20mg  in the morning and 10mg  in the evening. 11/18/23  Yes Waddell Danelle ORN, MD  Rimegepant Sulfate (NURTEC) 75 MG TBDP Take 1 tablet (75 mg total) by mouth every other day. 10/28/23  Yes Joshua Debby CROME, MD  predniSONE  (DELTASONE ) 20 MG tablet Take 2 tablets (40 mg total) by mouth daily for 3 days. 02/05/24 02/08/24  Johnie Flaming A, NP  triamcinolone  cream  (KENALOG ) 0.1 % Apply 1 Application topically 2 (two) times daily. 02/05/24   Johnie Flaming LABOR, NP    Family History Family History  Problem Relation Age of Onset   Heart disease Mother    Diabetes Mother    Asthma Mother    COPD Mother    Lupus Mother    Stroke Mother    Hypertension Mother    Colon polyps Mother    Fibroids Mother    Kidney disease Mother    Fibromyalgia Mother    Sarcoidosis Mother    Cervical cancer Mother    Irritable bowel syndrome Mother    Liver disease Mother    Cystic fibrosis Mother    COPD Maternal Grandmother    Heart disease Maternal Grandmother    Breast cancer Maternal Aunt    Long QT syndrome Son    Hearing loss Neg Hx    Heart attack Neg Hx    Colon cancer Neg Hx    Stomach cancer Neg Hx     Social History Social History   Tobacco Use   Smoking status: Never   Smokeless tobacco: Never  Vaping Use   Vaping status: Never Used  Substance Use Topics   Alcohol use: No   Drug use: No     Allergies   Patient has no known allergies.   Review of Systems Review of Systems  Per HPI  Physical Exam Triage Vital Signs ED Triage Vitals  Encounter Vitals Group     BP 02/05/24 1612 (!) 112/52     Girls Systolic BP Percentile --      Girls Diastolic BP Percentile --      Boys Systolic BP Percentile --      Boys Diastolic BP Percentile --      Pulse Rate 02/05/24 1612 60     Resp 02/05/24 1612 16     Temp 02/05/24 1612 98.3 F (36.8 C)     Temp Source 02/05/24 1612 Oral     SpO2 02/05/24 1612 99 %     Weight 02/05/24 1612 130 lb (59 kg)     Height 02/05/24 1612 5' 4 (1.626 m)     Head Circumference --      Peak Flow --      Pain Score 02/05/24 1611 5     Pain Loc --      Pain Education --      Exclude from Growth Chart --    No data found.  Updated Vital Signs BP (!) 112/52 (BP Location: Right Arm)   Pulse 60   Temp 98.3 F (36.8 C) (Oral)   Resp 16  Ht 5' 4 (1.626 m)   Wt 130 lb (59 kg)   LMP 01/17/2024  (Approximate)   SpO2 99%   BMI 22.31 kg/m   Visual Acuity Right Eye Distance:   Left Eye Distance:   Bilateral Distance:    Right Eye Near:   Left Eye Near:    Bilateral Near:     Physical Exam Vitals and nursing note reviewed.  Constitutional:      General: She is awake. She is not in acute distress.    Appearance: Normal appearance. She is well-developed and well-groomed. She is not ill-appearing.  Skin:    Findings: Rash present. Rash is urticarial.     Comments: Urticarial rash noted to medial aspects of bilateral upper and lower legs.  Neurological:     Mental Status: She is alert.  Psychiatric:        Behavior: Behavior is cooperative.      UC Treatments / Results  Labs (all labs ordered are listed, but only abnormal results are displayed) Labs Reviewed - No data to display  EKG   Radiology No results found.  Procedures Procedures (including critical care time)  Medications Ordered in UC Medications - No data to display  Initial Impression / Assessment and Plan / UC Course  I have reviewed the triage vital signs and the nursing notes.  Pertinent labs & imaging results that were available during my care of the patient were reviewed by me and considered in my medical decision making (see chart for details).     Patient is overall well-appearing.  Vitals are stable.  Upon assessment there is a urticarial rash noted to medial aspects of bilateral upper and lower legs.  No rash present anywhere else on the body.  Prescribed prednisone  burst and triamcinolone  cream for rash.  Recommended daily antihistamine.  Given allergy specialist to follow-up with due to recurring issue.  Discussed follow-up and return precautions. Final Clinical Impressions(s) / UC Diagnoses   Final diagnoses:  Urticaria     Discharge Instructions      Start taking 2 tablets of prednisone  once daily for 3 days. Apply triamcinolone  cream twice daily to the affected areas. Take  over-the-counter cetirizine (Zyrtec) or loratadine (Claritin) once daily to help with your rash as well. I have attached an allergy specialist you can follow-up with for further evaluation management of this. Follow-up with your primary care provider or return here as needed.   ED Prescriptions     Medication Sig Dispense Auth. Provider   predniSONE  (DELTASONE ) 20 MG tablet  (Status: Discontinued) Take 2 tablets (40 mg total) by mouth daily for 3 days. 6 tablet Johnie Flaming A, NP   triamcinolone  cream (KENALOG ) 0.1 %  (Status: Discontinued) Apply 1 Application topically 2 (two) times daily. 30 g Johnie Flaming A, NP   predniSONE  (DELTASONE ) 20 MG tablet Take 2 tablets (40 mg total) by mouth daily for 3 days. 6 tablet Johnie Flaming A, NP   triamcinolone  cream (KENALOG ) 0.1 % Apply 1 Application topically 2 (two) times daily. 30 g Johnie Flaming A, NP      PDMP not reviewed this encounter.   Johnie Flaming A, NP 02/05/24 1659

## 2024-02-05 NOTE — ED Triage Notes (Signed)
 Patient presenting with hives on the thighs onset 5 days ago. Denies any new foods, meds, or products. No known insect bites. No one else with a rash.   Prescriptions or OTC medications tried: Yes- Cortisone cream and Benadryl     with little relief

## 2024-02-06 NOTE — Progress Notes (Signed)
 Remote ICD transmission.

## 2024-02-06 NOTE — Addendum Note (Signed)
 Addended by: TAWNI DRILLING D on: 02/06/2024 01:53 PM   Modules accepted: Orders

## 2024-02-09 ENCOUNTER — Other Ambulatory Visit: Payer: Self-pay | Admitting: Gastroenterology

## 2024-02-14 LAB — CYTOLOGY - PAP
Adequacy: ABSENT
Comment: NEGATIVE
Diagnosis: UNDETERMINED — AB
High risk HPV: NEGATIVE

## 2024-02-15 ENCOUNTER — Other Ambulatory Visit (INDEPENDENT_AMBULATORY_CARE_PROVIDER_SITE_OTHER)

## 2024-02-15 DIAGNOSIS — K5904 Chronic idiopathic constipation: Secondary | ICD-10-CM | POA: Diagnosis not present

## 2024-02-15 NOTE — Progress Notes (Signed)
 Agree with the assessment and plan as outlined by Alan Coombs, PA-C.  If no appreciable improvement with outlined plan, may again need to consider ARM.  Aicia Babinski, DO, [facility]

## 2024-02-16 ENCOUNTER — Ambulatory Visit: Payer: Self-pay | Admitting: Obstetrics

## 2024-02-16 LAB — HELICOBACTER PYLORI  SPECIAL ANTIGEN
MICRO NUMBER:: 16706607
SPECIMEN QUALITY: ADEQUATE

## 2024-03-02 ENCOUNTER — Telehealth: Payer: Self-pay | Admitting: Physician Assistant

## 2024-03-02 NOTE — Telephone Encounter (Signed)
 Please see note below and advise

## 2024-03-02 NOTE — Telephone Encounter (Signed)
 Patient is calling due to her receiving a SIBO breath test and stated that as she was reading it stats that she is not allow to be on a laxative for 1 week but she is not able to have a BM with it. Patient also stated that she is currently unemployed and currently is receiving medicaid but the SIBO test is not covered through her insurance and she is not able to afford the two hundred and something for it. Patient is now wondering if there is an alternative option in which she could take. Please advise.

## 2024-03-05 NOTE — Telephone Encounter (Signed)
 Can do trial of doxycycline  100 mg BID for 10 days rather than testing for SIBO if they patient is willing.

## 2024-03-06 NOTE — Telephone Encounter (Signed)
 Unable to reach pt by phone. Unable to leave voice mail due to pt voice mailbox being full.

## 2024-03-07 NOTE — Telephone Encounter (Signed)
 Left message for pt to call back

## 2024-03-08 ENCOUNTER — Other Ambulatory Visit: Payer: Self-pay

## 2024-03-08 MED ORDER — DOXYCYCLINE HYCLATE 100 MG PO CAPS: 100.0000 mg | ORAL_CAPSULE | Freq: Two times a day (BID) | ORAL | 0 refills | Status: AC

## 2024-03-08 NOTE — Telephone Encounter (Signed)
 Mychart message sent to patient as well.

## 2024-03-08 NOTE — Telephone Encounter (Signed)
 Refer to pt message. Prescription sent in.
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
## 2024-03-12 ENCOUNTER — Telehealth: Payer: Self-pay | Admitting: Pharmacy Technician

## 2024-03-12 ENCOUNTER — Encounter: Payer: Self-pay | Admitting: Neurology

## 2024-03-12 ENCOUNTER — Ambulatory Visit (INDEPENDENT_AMBULATORY_CARE_PROVIDER_SITE_OTHER): Admitting: Neurology

## 2024-03-12 ENCOUNTER — Telehealth: Payer: Self-pay

## 2024-03-12 ENCOUNTER — Other Ambulatory Visit (HOSPITAL_COMMUNITY): Payer: Self-pay

## 2024-03-12 VITALS — BP 119/67 | HR 69 | Resp 20 | Ht 63.0 in | Wt 136.0 lb

## 2024-03-12 DIAGNOSIS — G43019 Migraine without aura, intractable, without status migrainosus: Secondary | ICD-10-CM

## 2024-03-12 MED ORDER — NURTEC 75 MG PO TBDP: 1.0000 | ORAL_TABLET | Freq: Every day | ORAL | 11 refills | Status: AC | PRN

## 2024-03-12 MED ORDER — QULIPTA 60 MG PO TABS: 60.0000 mg | ORAL_TABLET | Freq: Every day | ORAL | 11 refills | Status: AC

## 2024-03-12 NOTE — Telephone Encounter (Signed)
 PA has been submitted, and telephone encounter has been created. Please see telephone encounter dated 8.11.25.  QULIPTA , may not be covered due to patient not try/fail and injectable. PA has been submitted as is and is pending

## 2024-03-12 NOTE — Telephone Encounter (Signed)
 Pharmacy Patient Advocate Encounter   Received notification from Pt Calls Messages that prior authorization for NURTEC 75MG  is required/requested.   Insurance verification completed.   The patient is insured through [facility] .   Per test claim: PA required; PA submitted to above mentioned insurance via CoverMyMeds Key/confirmation #/EOC [id number] Status is pending

## 2024-03-12 NOTE — Telephone Encounter (Signed)
 PA needed for Qulipta /Nurtec

## 2024-03-12 NOTE — Patient Instructions (Addendum)
  Start Qulipta  60mg  daily.  Contact us  in 3 months. STOP ALL OVER THE COUNTER PAIN RELIEVERS.  Take Nurtec at earliest onset of headache.  Maximum 1 tablet in 24 hours. Limit use of pain relievers to no more than 9 days out of the month.  These medications include acetaminophen , NSAIDs (ibuprofen /Advil /Motrin , naproxen /Aleve , triptans (Imitrex/sumatriptan), Excedrin, and narcotics.  This will help reduce risk of rebound headaches. Be aware of common food triggers:  - Caffeine:  coffee, black tea, cola, Mt. Dew  - Chocolate  - Dairy:  aged cheeses (brie, blue, cheddar, gouda, Parmasan, provolone, romano, Swiss, etc), chocolate milk, buttermilk, sour cream, limit eggs and yogurt  - Nuts, peanut butter  - Alcohol  - Cereals/grains:  FRESH breads (fresh bagels, sourdough, doughnuts), yeast productions  - Processed/canned/aged/cured meats (pre-packaged deli meats, hotdogs)  - MSG/glutamate:  soy sauce, flavor enhancer, pickled/preserved/marinated foods  - Sweeteners:  aspartame (Equal, Nutrasweet).  Sugar and Splenda are okay  - Vegetables:  legumes (lima beans, lentils, snow peas, fava beans, pinto peans, peas, garbanzo beans), sauerkraut, onions, olives, pickles  - Fruit:  avocados, bananas, citrus fruit (orange, lemon, grapefruit), mango  - Other:  Frozen meals, macaroni and cheese Routine exercise Stay adequately hydrated (aim for 64 oz water daily) Keep headache diary Maintain proper stress management Maintain proper sleep hygiene Do not skip meals Consider supplements:  magnesium  citrate 400mg  daily, riboflavin 400mg  daily, coenzyme Q10 100mg  three times daily.

## 2024-03-12 NOTE — Telephone Encounter (Signed)
 Pharmacy Patient Advocate Encounter   Received notification from Pt Calls Messages that prior authorization for QULIPTA  60MG  is required/requested.   Insurance verification completed.   The patient is insured through [facility] .   Per test claim: PA required; PA submitted to above mentioned insurance via CoverMyMeds Key/confirmation #/EOC [id number] Status is pending

## 2024-03-13 NOTE — Telephone Encounter (Signed)
 Pharmacy Patient Advocate Encounter  Received notification from [employer] that Prior Authorization for QULIPTA  60MG  has been DENIED.  Full denial letter will be uploaded to the media tab. See denial reason below.   PA #/Case ID/Reference #:  [id number]

## 2024-03-13 NOTE — Telephone Encounter (Signed)
 Pharmacy Patient Advocate Encounter  Received notification from [employer] that Prior Authorization for NURTEC 75MG  has been DENIED.  Full denial letter will be uploaded to the media tab. See denial reason below.   PA #/Case ID/Reference #: [id number]

## 2024-03-14 NOTE — Telephone Encounter (Signed)
 I've sent a message to our appeals team. The original pa was submitted for acute treatment, but was denied wanting the try/fail of preventative medication alternatives. Appeals team aware.  Information has been sent to clinical pharmacist for appeals review. It may take 5-7 days to prepare the necessary documentation to request the appeal from the insurance.

## 2024-03-28 NOTE — Progress Notes (Unsigned)
 03/29/2024 Courtney Rice [id number] 1990-11-01  Referring provider: Joshua Debby CROME, MD Primary GI doctor: Dr. San  ASSESSMENT AND PLAN:  Chronic constipation Torturous colon March 2024 recall 7 years Negative celiac and H. pylori Has failed Linzess , Isbrela Started herbal tea lemon and ginger and has had BM daily for 2-3 weeks has had BM daily.  - get KUB - continue tea, add on kiwi, given information -Xifaxan was not covered, patient treated with doxycycline  100 mg twice daily for 10 days but not started, start on this -Consider Trulance versus Motegrity - consider ARM  QT prolongation syndrome status post AICD placement 2015 Had episode of VT in movement with AICD being triggered  Personal history of tubular adenomatous polyps Colonoscopy March 2024 showed tortuous colon, 2 tubular adenomas that were 3 to 4 mm in size with the recommended repeat in 7 years   Patient Care Team: Joshua Debby CROME, MD as PCP - General (Internal Medicine) Waddell Danelle ORN, MD as PCP - Electrophysiology (Cardiology)  HISTORY OF PRESENT ILLNESS: 33 y.o. female with a past medical history listed below presents for evaluation of constipation.   Patient last seen in the office 01/27/2024 for chronic constipation.  Linzess  290 and isbrella caused diarrhea.  Negative celiac and H. pylori.  Discussed the use of AI scribe software for clinical note transcription with the patient, who gave verbal consent to proceed.  History of Present Illness   Courtney Rice is a 33 year old female with a tortuous colon who presents with ongoing gastrointestinal symptoms.  She experiences ongoing nausea, bloating, and food sensitivities. Despite these symptoms, she has been able to have daily bowel movements for the past three to four weeks without medication, which she attributes to drinking a lemon and ginger herbal tea called 'twenty Supreme'. Previously, she tried Linzess  and Isbrela, both of which  caused diarrhea, and Linzess  eventually stopped working completely. She has not been on any medication recently for her gastrointestinal symptoms.  Her bowel movements sometimes occur twice a day, but she does not always feel they are complete. No blood in her stool, no dark black stool, and no weight loss reported. She has a history of a colonoscopy that revealed a tortuous colon.  She experiences occasional abdominal pain, particularly on the left side. Her symptoms can vary with physical activity and dietary intake.  She recently started a new job, which has impacted her ability to attend certain therapies.      She  reports that she has never smoked. She has never used smokeless tobacco. She reports that she does not drink alcohol and does not use drugs.  RELEVANT GI HISTORY, IMAGING AND LABS: Results   DIAGNOSTIC Colonoscopy: Tortuous colon      CBC    Component Value Date/Time   WBC 4.1 06/20/2023 0800   RBC 4.62 06/20/2023 0800   HGB 14.1 06/20/2023 0800   HGB 14.2 10/02/2020 1322   HCT 42.1 06/20/2023 0800   HCT 40.6 10/02/2020 1322   PLT 231 06/20/2023 0800   PLT 228 10/02/2020 1322   MCV 91.1 06/20/2023 0800   MCV 90 10/02/2020 1322   MCH 30.5 06/20/2023 0800   MCHC 33.5 06/20/2023 0800   RDW 11.9 06/20/2023 0800   RDW 12.1 10/02/2020 1322   LYMPHSABS 2.8 08/18/2022 1103   LYMPHSABS 2.6 10/02/2020 1322   MONOABS 0.5 08/18/2022 1103   EOSABS 0.1 08/18/2022 1103   EOSABS 0.1 10/02/2020 1322   BASOSABS 0.0 08/18/2022  1103   BASOSABS 0.1 10/02/2020 1322   Recent Labs    06/20/23 0800  HGB 14.1    CMP     Component Value Date/Time   NA 139 10/25/2023 1411   NA 141 10/02/2020 1322   K 4.2 10/25/2023 1411   CL 104 10/25/2023 1411   CO2 31 10/25/2023 1411   GLUCOSE 93 10/25/2023 1411   BUN 8 10/25/2023 1411   BUN 7 10/02/2020 1322   CREATININE 0.61 10/25/2023 1411   CALCIUM 9.6 10/25/2023 1411   PROT 7.4 08/18/2022 1103   ALBUMIN 4.5 08/18/2022 1103    AST 20 08/18/2022 1103   ALT 16 08/18/2022 1103   ALKPHOS 46 08/18/2022 1103   BILITOT 0.4 08/18/2022 1103   GFRNONAA >60 06/20/2023 0800   GFRAA 137 05/26/2017 0952      Latest Ref Rng & Units 08/18/2022   11:03 AM 08/01/2015    6:40 PM 04/10/2013    5:02 PM  Hepatic Function  Total Protein 6.0 - 8.3 g/dL 7.4  6.6  7.7   Albumin 3.5 - 5.2 g/dL 4.5  3.8  4.0   AST 0 - 37 U/L 20  17  16    ALT 0 - 35 U/L 16  13  10    Alk Phosphatase 39 - 117 U/L 46  49  41   Total Bilirubin 0.2 - 1.2 mg/dL 0.4  0.6  0.2   Bilirubin, Direct 0.0 - 0.3 mg/dL 0.1         Current Medications:    Current Outpatient Medications (Cardiovascular):    nadolol  (CORGARD ) 20 MG tablet, Take 20mg  in the morning and 10mg  in the evening.   Current Outpatient Medications (Analgesics):    Atogepant  (QULIPTA ) 60 MG TABS, Take 1 tablet (60 mg total) by mouth daily.   ibuprofen  (ADVIL ) 800 MG tablet, Take 1 tablet (800 mg total) by mouth every 8 (eight) hours as needed.   Rimegepant Sulfate (NURTEC) 75 MG TBDP, Take 1 tablet (75 mg total) by mouth daily as needed. (Patient not taking: Reported on 03/29/2024)   Current Outpatient Medications (Other):    dicyclomine  (BENTYL ) 10 MG capsule, Take 1 capsule (10 mg total) by mouth every 6 (six) hours as needed for spasms.   DULoxetine  (CYMBALTA ) 30 MG capsule, Take 1 capsule (30 mg total) by mouth daily.  Medical History:  Past Medical History:  Diagnosis Date   AICD (automatic cardioverter/defibrillator) present    Long Q-T syndrome Type 2    a. post partum syncope and VT PM;  b. 11/2013 s/p BSX SubQ ICD placement.   Migraine    Pre-syncope admitted 08/01/2015   a. felt to be 2/2 orthostatic hypotension - propranolol  decreased.    Allergies: No Known Allergies   Surgical History:  She  has a past surgical history that includes Implantable cardioverter defibrillator implant (12/20/2013); implantable cardioverter defibrillator implant (N/A, 12/20/2013);  Tonsillectomy and adenoidectomy (08/02/2009); SUBQ ICD CHANGEOUT (N/A, 10/31/2020); and polyp removal (2024). Family History:  Her family history includes Asthma in her mother; Breast cancer in her maternal aunt; COPD in her maternal grandmother and mother; Cervical cancer in her mother; Colon polyps in her mother; Cystic fibrosis in her mother; Diabetes in her mother; Fibroids in her mother; Fibromyalgia in her mother; Heart disease in her maternal grandmother and mother; Hypertension in her mother; Irritable bowel syndrome in her mother; Kidney disease in her mother; Liver disease in her mother; Long QT syndrome in her son; Lupus in her mother;  Sarcoidosis in her mother; Stroke in her mother.  REVIEW OF SYSTEMS  : All other systems reviewed and negative except where noted in the History of Present Illness.  PHYSICAL EXAM: BP 104/60 (BP Location: Left Arm, Patient Position: Sitting, Cuff Size: Normal)   Pulse (!) 52   Ht 5' 4 (1.626 m)   Wt 134 lb 6 oz (61 kg)   LMP 03/15/2024   BMI 23.07 kg/m  Physical Exam   GENERAL APPEARANCE: Well nourished, in no apparent distress. HEENT: No cervical lymphadenopathy, unremarkable thyroid , sclerae anicteric, conjunctiva pink. RESPIRATORY: Respiratory effort normal, breath sounds equal bilaterally without rales, rhonchi, or wheezing. CARDIO: Regular rate and rhythm with no murmurs, rubs, or gallops, peripheral pulses intact. ABDOMEN: Soft, non-distended, active bowel sounds in all four quadrants, no tenderness to palpation, no rebound, no mass appreciated. RECTAL: Declines. MUSCULOSKELETAL: Full range of motion, normal gait, without edema. SKIN: Dry, intact without rashes or lesions. No jaundice. NEURO: Alert, oriented, no focal deficits. PSYCH: Cooperative, normal mood and affect.      Alan JONELLE Coombs, PA-C 11:22 AM

## 2024-03-29 ENCOUNTER — Ambulatory Visit
Admission: RE | Admit: 2024-03-29 | Discharge: 2024-03-29 | Disposition: A | Discharge status: Home / Self Care | Source: Ambulatory Visit | Attending: Physician Assistant | Admitting: Physician Assistant

## 2024-03-29 ENCOUNTER — Ambulatory Visit (INDEPENDENT_AMBULATORY_CARE_PROVIDER_SITE_OTHER): Admitting: Physician Assistant

## 2024-03-29 ENCOUNTER — Encounter: Payer: Self-pay | Admitting: Physician Assistant

## 2024-03-29 VITALS — BP 104/60 | HR 52 | Ht 64.0 in | Wt 134.4 lb

## 2024-03-29 DIAGNOSIS — K5904 Chronic idiopathic constipation: Secondary | ICD-10-CM

## 2024-03-29 DIAGNOSIS — R140 Abdominal distension (gaseous): Secondary | ICD-10-CM

## 2024-03-29 DIAGNOSIS — I4581 Long QT syndrome: Secondary | ICD-10-CM

## 2024-03-29 DIAGNOSIS — Z860101 Personal history of adenomatous and serrated colon polyps: Secondary | ICD-10-CM | POA: Diagnosis not present

## 2024-03-29 DIAGNOSIS — D125 Benign neoplasm of sigmoid colon: Secondary | ICD-10-CM

## 2024-03-29 MED ORDER — CLOTRIMAZOLE 2 % VA CREA: 1.0000 | TOPICAL_CREAM | Freq: Every day | VAGINAL | 0 refills | Status: DC

## 2024-03-29 NOTE — Patient Instructions (Addendum)
 Your provider has requested that you have an abdominal x ray before leaving today. Please go to the basement floor to our Radiology department for the test.  Continue the tea, can do kiwis every other day to help with constipation Try the doxycycline , if this does not help and stools get worse, lets get the anal rectal manometry  Toileting tips to help with your constipation - Drink at least 64-80 ounces of water/liquid per day. - Establish a time to try to move your bowels every day.  For many people, this is after a cup of coffee or after a meal such as breakfast. - Sit all of the way back on the toilet keeping your back fairly straight and while sitting up, try to rest the tops of your forearms on your upper thighs.   - Raising your feet with a step stool/squatty potty can be helpful to improve the angle that allows your stool to pass through the rectum. - Relax the rectum feeling it bulge toward the toilet water.  If you feel your rectum raising toward your body, you are contracting rather than relaxing. - Breathe in and slowly exhale. Belly breath by expanding your belly towards your belly button. Keep belly expanded as you gently direct pressure down and back to the anus.  A low pitched GRRR sound can assist with increasing intra-abdominal pressure.  (Can also trying to blow on a pinwheel and make it move, this helps with the same belly breathing) - Repeat 3-4 times. If unsuccessful, contract the pelvic floor to restore normal tone and get off the toilet.  Avoid excessive straining. - To reduce excessive wiping by teaching your anus to normally contract, place hands on outer aspect of knees and resist knee movement outward.  Hold 5-10 second then place hands just inside of knees and resist inward movement of knees.  Hold 5 seconds.  Repeat a few times each way.  Go to the ER if unable to pass gas, severe AB pain, unable to hold down food, any shortness of breath of chest pain.  Here some  information about pelvic floor dysfunction. This may be contributing to some of your symptoms. We will continue with our evaluation but I do want you to consider adding on fiber supplement with low-dose MiraLAX daily. We could also refer to pelvic floor physical therapy.   Pelvic Floor Dysfunction, Female Pelvic floor dysfunction (PFD) is a condition that results when the group of muscles and connective tissues that support the organs in the pelvis (pelvic floor muscles) do not work well. These muscles and their connections form a sling that supports the colon and bladder. In women, they also support the uterus. PFD causes pelvic floor muscles to be too weak, too tight, or both. In PFD, muscle movements are not coordinated. This may cause bowel or bladder problems. It may also cause pain. What are the causes? This condition may be caused by an injury to the pelvic area or by a weakening of pelvic muscles. This often results from pregnancy and childbirth or other types of strain. In many cases, the exact cause is not known. What increases the risk? The following factors may make you more likely to develop this condition: Having chronic bladder tissue inflammation (interstitial cystitis). Being an older person. Being overweight. History of radiation treatment for cancer in the pelvic region. Previous pelvic surgery, such as removal of the uterus (hysterectomy). What are the signs or symptoms? Symptoms of this condition vary and may include: Bladder  symptoms, such as: Trouble starting urination and emptying the bladder. Frequent urinary tract infections. Leaking urine when coughing, laughing, or exercising (stress incontinence). Having to pass urine urgently or frequently. Pain when passing urine. Bowel symptoms, such as: Constipation. Urgent or frequent bowel movements. Incomplete bowel movements. Painful bowel movements. Leaking stool or gas. Unexplained genital or rectal  pain. Genital or rectal muscle spasms. Low back pain. Other symptoms may include: A heavy, full, or aching feeling in the vagina. A bulge that protrudes into the vagina. Pain during or after sex. How is this diagnosed? This condition may be diagnosed based on: Your symptoms and medical history. A physical exam. During the exam, your health care provider may check your pelvic muscles for tightness, spasm, pain, or weakness. This may include a rectal exam and a pelvic exam. In some cases, you may have diagnostic tests, such as: Electrical muscle function tests. Urine flow testing. X-ray tests of bowel function. Ultrasound of the pelvic organs. How is this treated? Treatment for this condition depends on the symptoms. Treatment options include: Physical therapy. This may include Kegel exercises to help relax or strengthen the pelvic floor muscles. Biofeedback. This type of therapy provides feedback on how tight your pelvic floor muscles are so that you can learn to control them. Internal or external massage therapy. A treatment that involves electrical stimulation of the pelvic floor muscles to help control pain (transcutaneous electrical nerve stimulation, or TENS). Sound wave therapy (ultrasound) to reduce muscle spasms. Medicines, such as: Muscle relaxants. Bladder control medicines. Surgery to reconstruct or support pelvic floor muscles may be an option if other treatments do not help. Follow these instructions at home: Activity Do your usual activities as told by your health care provider. Ask your health care provider if you should modify any activities. Do pelvic floor strengthening or relaxing exercises at home as told by your physical therapist. Lifestyle Maintain a healthy weight. Eat foods that are high in fiber, such as beans, whole grains, and fresh fruits and vegetables. Limit foods that are high in fat and processed sugars, such as fried or sweet foods. Manage stress  with relaxation techniques such as yoga or meditation. General instructions If you have problems with leakage: Use absorbable pads or wear padded underwear. Wash frequently with mild soap. Keep your genital and anal area as clean and dry as possible. Ask your health care provider if you should try a barrier cream to prevent skin irritation. Take warm baths to relieve pelvic muscle tension or spasms. Take over-the-counter and prescription medicines only as told by your health care provider. Keep all follow-up visits. How is this prevented? The cause of PFD is not always known, but there are a few things you can do to reduce the risk of developing this condition, including: Staying at a healthy weight. Getting regular exercise. Managing stress. Contact a health care provider if: Your symptoms are not improving with home care. You have signs or symptoms of PFD that get worse at home. You develop new signs or symptoms. You have signs of a urinary tract infection, such as: Fever. Chills. Increased urinary frequency. A burning feeling when urinating. You have not had a bowel movement in 3 days (constipation). Summary Pelvic floor dysfunction results when the muscles and connective tissues in your pelvic floor do not work well. These muscles and their connections form a sling that supports your colon and bladder. In women, they also support the uterus. PFD may be caused by an injury to the  pelvic area or by a weakening of pelvic muscles. PFD causes pelvic floor muscles to be too weak, too tight, or a combination of both. Symptoms may vary from person to person. In most cases, PFD can be treated with physical therapies and medicines. Surgery may be an option if other treatments do not help. This information is not intended to replace advice given to you by your health care provider. Make sure you discuss any questions you have with your health care provider. Document Revised: 11/26/2020  Document Reviewed: 11/26/2020 Elsevier Patient Education  2022 [employer]   Small intestinal bacterial overgrowth (SIBO) occurs when there is an abnormal increase in the overall bacterial population in the small intestine -- particularly types of bacteria not commonly found in that part of the digestive tract. Small intestinal bacterial overgrowth (SIBO) commonly results when a circumstance -- such as surgery or disease -- slows the passage of food and waste products in the digestive tract, creating a breeding ground for bacteria.  Signs and symptoms of SIBO often include: Loss of appetite Abdominal pain Nausea Bloating An uncomfortable feeling of fullness after eating Diarrhea or constipation, depending on the type of gas produced  What foods trigger SIBO? While foods aren't the original cause of SIBO, certain foods do encourage the overgrowth of the wrong bacteria in your small intestine. If you're feeding them their favorite foods, they're going to grow more, and that will trigger more of your SIBO symptoms. By the same token, you can help reduce the overgrowth by starving the problematic bacteria of their favorite foods. This strategy has led to a number of proposed SIBO eating plans. The plans vary, and so do individual results. But in general, they tend to recommend limiting carbohydrates.  These include: Sugars and sweeteners. Fruits and starchy vegetables. Dairy products. Grains.  There is a test for this we can do called a breath test, if you are positive we will treat you with an antibiotic to see if it helps.  Your symptoms are very suspicious for this condition, as discussed, we will start you on an antibiotic to see if this helps.

## 2024-03-29 NOTE — Addendum Note (Signed)
 Addended by: CRAIG PALMA on: 03/29/2024 04:08 PM   Modules accepted: Orders

## 2024-04-02 ENCOUNTER — Encounter (HOSPITAL_COMMUNITY): Payer: Self-pay | Admitting: *Deleted

## 2024-04-02 ENCOUNTER — Ambulatory Visit (HOSPITAL_COMMUNITY)
Admission: EM | Admit: 2024-04-02 | Discharge: 2024-04-02 | Disposition: A | Discharge status: Home / Self Care | Attending: Emergency Medicine | Admitting: Emergency Medicine

## 2024-04-02 DIAGNOSIS — U071 COVID-19: Secondary | ICD-10-CM | POA: Diagnosis not present

## 2024-04-02 DIAGNOSIS — Z20822 Contact with and (suspected) exposure to covid-19: Secondary | ICD-10-CM

## 2024-04-02 LAB — POC SARS CORONAVIRUS 2 AG -  ED: SARS Coronavirus 2 Ag: POSITIVE — AB

## 2024-04-02 NOTE — Discharge Instructions (Signed)
 Your covid test is positive This is a virus treated with supportive care Keep hydrated, wash hands, wear a mask, and keep your distance from others

## 2024-04-02 NOTE — ED Triage Notes (Signed)
 Pt states her BF tested positive for COVID today. She took an at home test she thinks it was positive but not sure she did it right. She has nasal congestion X 4 days and headache, cough today. She hasn't taken any meds

## 2024-04-02 NOTE — ED Provider Notes (Signed)
 [facility]    CSN: [id number] Arrival date & time: 04/02/24  1402      History   Chief Complaint Chief Complaint  Patient presents with   Nasal Congestion   Headache   covid test    HPI Courtney Rice is a 33 y.o. female.  Today woke with nasal congestion, headache, body aches and fatigue No fever or cough Her boyfriend also woke up sick, went to a clinic this morning and had positive covid test Patient would like test today  Past Medical History:  Diagnosis Date   AICD (automatic cardioverter/defibrillator) present    Long Q-T syndrome Type 2    a. post partum syncope and VT PM;  b. 11/2013 s/p BSX SubQ ICD placement.   Migraine    Pre-syncope admitted 08/01/2015   a. felt to be 2/2 orthostatic hypotension - propranolol  decreased.     Patient Active Problem List   Diagnosis Date Noted   Mild episode of recurrent major depressive disorder (HCC) 01/18/2024   Chronic migraine with aura without status migrainosus, not intractable 10/25/2023   Encounter for general adult medical examination with abnormal findings 10/25/2023   Immunization due 10/25/2023   Vitamin D  deficiency 10/25/2023   Constipation 06/15/2023   Patellar tendonitis of both knees 08/18/2022   Current moderate episode of major depressive disorder without prior episode (HCC) 10/08/2021   ICD (implantable cardioverter-defibrillator) in place 02/16/2021   HGSIL (high grade squamous intraepithelial lesion) on Pap smear of cervix 09/06/2019   LGSIL on Pap smear of cervix 07/12/2018   Polymorphic ventricular tachycardia (HCC) 11/30/2013   Long Q-T syndrome 06/06/2013    Past Surgical History:  Procedure Laterality Date   IMPLANTABLE CARDIOVERTER DEFIBRILLATOR IMPLANT  12/20/2013   BSX SubQ ICD implanted by Dr Fernande   IMPLANTABLE CARDIOVERTER DEFIBRILLATOR IMPLANT N/A 12/20/2013   Procedure: SUB Q ICD;  Surgeon: Elspeth JAYSON Fernande, MD;  Location: [facility] CATH LAB;  Service: Cardiovascular;   Laterality: N/A;   polyp removal  2024   SUBQ ICD CHANGEOUT N/A 10/31/2020   Procedure: SUBQ ICD CHANGEOUT;  Surgeon: Fernande Elspeth JAYSON, MD;  Location: [facility] INVASIVE CV LAB;  Service: Cardiovascular;  Laterality: N/A;   TONSILLECTOMY AND ADENOIDECTOMY  08/02/2009    OB History     Gravida  1   Para  1   Term  1   Preterm      AB      Living  1      SAB      IAB      Ectopic      Multiple      Live Births  1            Home Medications    Prior to Admission medications   Medication Sig Start Date End Date Taking? Authorizing Provider  nadolol  (CORGARD ) 20 MG tablet Take 20mg  in the morning and 10mg  in the evening. 11/18/23  Yes Waddell Danelle ORN, MD  Atogepant  (QULIPTA ) 60 MG TABS Take 1 tablet (60 mg total) by mouth daily. 03/12/24   Skeet Juliene SAUNDERS, DO  clotrimazole  (GYNE-LOTRIMIN  3) 2 % vaginal cream Place 1 Applicatorful vaginally at bedtime. 03/29/24   Craig Alan SAUNDERS, PA-C  dicyclomine  (BENTYL ) 10 MG capsule Take 1 capsule (10 mg total) by mouth every 6 (six) hours as needed for spasms. 06/15/23   Zehr, Jessica D, PA-C  DULoxetine  (CYMBALTA ) 30 MG capsule Take 1 capsule (30 mg total) by mouth daily. 01/18/24   Joshua,  Debby CROME, MD  ibuprofen  (ADVIL ) 800 MG tablet Take 1 tablet (800 mg total) by mouth every 8 (eight) hours as needed. 01/31/24   Rudy Carlin LABOR, MD  Rimegepant Sulfate (NURTEC) 75 MG TBDP Take 1 tablet (75 mg total) by mouth daily as needed. Patient not taking: Reported on 03/29/2024 03/12/24   Skeet Juliene SAUNDERS, DO    Family History Family History  Problem Relation Age of Onset   Heart disease Mother    Diabetes Mother    Asthma Mother    COPD Mother    Lupus Mother    Stroke Mother    Hypertension Mother    Colon polyps Mother    Fibroids Mother    Kidney disease Mother    Fibromyalgia Mother    Sarcoidosis Mother    Cervical cancer Mother    Irritable bowel syndrome Mother    Liver disease Mother    Cystic fibrosis Mother    COPD Maternal  Grandmother    Heart disease Maternal Grandmother    Breast cancer Maternal Aunt    Long QT syndrome Son    Hearing loss Neg Hx    Heart attack Neg Hx    Colon cancer Neg Hx    Stomach cancer Neg Hx     Social History Social History   Tobacco Use   Smoking status: Never   Smokeless tobacco: Never  Vaping Use   Vaping status: Never Used  Substance Use Topics   Alcohol use: No   Drug use: No     Allergies   Patient has no known allergies.   Review of Systems Review of Systems As per HPI  Physical Exam Triage Vital Signs ED Triage Vitals  Encounter Vitals Group     BP 04/02/24 1524 115/79     Girls Systolic BP Percentile --      Girls Diastolic BP Percentile --      Boys Systolic BP Percentile --      Boys Diastolic BP Percentile --      Pulse Rate 04/02/24 1524 70     Resp 04/02/24 1524 18     Temp 04/02/24 1524 98.6 F (37 C)     Temp Source 04/02/24 1524 Oral     SpO2 04/02/24 1524 98 %     Weight --      Height --      Head Circumference --      Peak Flow --      Pain Score 04/02/24 1523 4     Pain Loc --      Pain Education --      Exclude from Growth Chart --    No data found.  Updated Vital Signs BP 115/79 (BP Location: Right Arm)   Pulse 70   Temp 98.6 F (37 C) (Oral)   Resp 18   LMP 03/15/2024 (Exact Date)   SpO2 98%   Physical Exam Vitals and nursing note reviewed.  Constitutional:      Appearance: She is not ill-appearing.  HENT:     Right Ear: Tympanic membrane and ear canal normal.     Left Ear: Tympanic membrane and ear canal normal.     Nose: No congestion or rhinorrhea.     Mouth/Throat:     Mouth: Mucous membranes are moist.     Pharynx: Oropharynx is clear. No posterior oropharyngeal erythema.  Eyes:     Conjunctiva/sclera: Conjunctivae normal.  Cardiovascular:     Rate and Rhythm: Normal rate  and regular rhythm.     Pulses: Normal pulses.     Heart sounds: Normal heart sounds.  Pulmonary:     Effort: Pulmonary  effort is normal.     Breath sounds: Normal breath sounds.  Musculoskeletal:     Cervical back: Normal range of motion.  Lymphadenopathy:     Cervical: No cervical adenopathy.  Skin:    General: Skin is warm and dry.  Neurological:     Mental Status: She is alert and oriented to person, place, and time.     UC Treatments / Results  Labs (all labs ordered are listed, but only abnormal results are displayed) Labs Reviewed  POC SARS CORONAVIRUS 2 AG -  ED - Abnormal; Notable for the following components:      Result Value   SARS Coronavirus 2 Ag Positive (*)    All other components within normal limits    EKG   Radiology No results found.  Procedures Procedures (including critical care time)  Medications Ordered in UC Medications - No data to display  Initial Impression / Assessment and Plan / UC Course  I have reviewed the triage vital signs and the nursing notes.  Pertinent labs & imaging results that were available during my care of the patient were reviewed by me and considered in my medical decision making (see chart for details).  Afebrile, well appearing, clear lungs. Close exposure to covid  Her rapid COVID test is positive Discussed over-the-counter treatments and supportive care.  A note for her job is provided.  All questions were answered  Final Clinical Impressions(s) / UC Diagnoses   Final diagnoses:  Close exposure to COVID-19 virus  COVID-19     Discharge Instructions      Your covid test is positive This is a virus treated with supportive care Keep hydrated, wash hands, wear a mask, and keep your distance from others    ED Prescriptions   None    PDMP not reviewed this encounter.   Jeryl Stabs, PA-C 04/02/24 1631

## 2024-04-09 ENCOUNTER — Other Ambulatory Visit: Payer: Self-pay | Admitting: Obstetrics

## 2024-04-09 ENCOUNTER — Ambulatory Visit (INDEPENDENT_AMBULATORY_CARE_PROVIDER_SITE_OTHER): Payer: Self-pay

## 2024-04-09 ENCOUNTER — Encounter: Payer: Self-pay | Admitting: Obstetrics

## 2024-04-09 ENCOUNTER — Ambulatory Visit: Payer: Self-pay | Admitting: Physician Assistant

## 2024-04-09 DIAGNOSIS — B3731 Acute candidiasis of vulva and vagina: Secondary | ICD-10-CM

## 2024-04-09 DIAGNOSIS — I4581 Long QT syndrome: Secondary | ICD-10-CM

## 2024-04-10 ENCOUNTER — Other Ambulatory Visit: Payer: Self-pay | Admitting: Obstetrics

## 2024-04-10 LAB — CUP PACEART REMOTE DEVICE CHECK
Battery Remaining Percentage: 62 %
Date Time Interrogation Session: 20250906190200
HighPow Impedance: 65 Ohm
Implantable Lead Connection Status: 753985
Implantable Lead Implant Date: 20150521
Implantable Lead Location: 753862
Implantable Lead Model: 3400
Implantable Pulse Generator Implant Date: 20220401
Pulse Gen Serial Number: 157706

## 2024-04-19 NOTE — Progress Notes (Signed)
Remote ICD Transmission.

## 2024-04-26 ENCOUNTER — Encounter: Payer: Self-pay | Admitting: Internal Medicine

## 2024-04-26 ENCOUNTER — Ambulatory Visit (INDEPENDENT_AMBULATORY_CARE_PROVIDER_SITE_OTHER): Admitting: Internal Medicine

## 2024-04-26 VITALS — BP 124/82 | HR 53 | Temp 98.2°F | Resp 16 | Ht 64.0 in | Wt 136.0 lb

## 2024-04-26 DIAGNOSIS — G43E09 Chronic migraine with aura, not intractable, without status migrainosus: Secondary | ICD-10-CM | POA: Diagnosis not present

## 2024-04-26 DIAGNOSIS — R001 Bradycardia, unspecified: Secondary | ICD-10-CM

## 2024-04-26 DIAGNOSIS — R768 Other specified abnormal immunological findings in serum: Secondary | ICD-10-CM

## 2024-04-26 DIAGNOSIS — M2550 Pain in unspecified joint: Secondary | ICD-10-CM

## 2024-04-26 DIAGNOSIS — I4581 Long QT syndrome: Secondary | ICD-10-CM

## 2024-04-26 LAB — C-REACTIVE PROTEIN: CRP: <1 mg/dL (ref 0.5–20.0)

## 2024-04-26 LAB — CBC WITH DIFFERENTIAL/PLATELET
Basophils Absolute: 0 K/uL (ref 0.0–0.1)
Basophils Relative: 0.5 % (ref 0.0–3.0)
Eosinophils Absolute: 0.1 K/uL (ref 0.0–0.7)
Eosinophils Relative: 1.6 % (ref 0.0–5.0)
HCT: 41.3 % (ref 36.0–46.0)
Hemoglobin: 14 g/dL (ref 12.0–15.0)
Lymphocytes Relative: 55.3 % — ABNORMAL HIGH (ref 12.0–46.0)
Lymphs Abs: 2.9 K/uL (ref 0.7–4.0)
MCHC: 33.9 g/dL (ref 30.0–36.0)
MCV: 91.1 fl (ref 78.0–100.0)
Monocytes Absolute: 0.6 K/uL (ref 0.1–1.0)
Monocytes Relative: 10.9 % (ref 3.0–12.0)
Neutro Abs: 1.6 K/uL (ref 1.4–7.7)
Neutrophils Relative %: 31.7 % — ABNORMAL LOW (ref 43.0–77.0)
Platelets: 223 K/uL (ref 150.0–400.0)
RBC: 4.53 Mil/uL (ref 3.87–5.11)
RDW: 12.5 % (ref 11.5–15.5)
WBC: 5.2 K/uL (ref 4.0–10.5)

## 2024-04-26 LAB — BASIC METABOLIC PANEL WITH GFR
BUN: 6 mg/dL (ref 6–23)
CO2: 29 meq/L (ref 19–32)
Calcium: 10 mg/dL (ref 8.4–10.5)
Chloride: 103 meq/L (ref 96–112)
Creatinine, Ser: 0.71 mg/dL (ref 0.40–1.20)
GFR: 112.52 mL/min (ref >60.00)
Glucose, Bld: 92 mg/dL (ref 70–99)
Potassium: 4.1 meq/L (ref 3.5–5.1)
Sodium: 138 meq/L (ref 135–145)

## 2024-04-26 MED ORDER — NURTEC 75 MG PO TBDP
1.0000 | ORAL_TABLET | ORAL | Status: AC | PRN
Start: 2024-04-26 — End: ?

## 2024-04-26 NOTE — Patient Instructions (Signed)
 Bradycardia, Adult Bradycardia is a slower-than-normal heartbeat. A normal resting heart rate for an adult ranges from 60 to 100 beats per minute. With bradycardia, the resting heart rate is less than 60 beats per minute. Bradycardia can prevent enough oxygen  from reaching certain areas of your body when you are active. It can be serious if it keeps enough oxygen  from reaching your brain and other parts of your body. Bradycardia is not a problem for everyone. For some healthy adults, a slow resting heart rate is normal. What are the causes? This condition may be caused by: A problem with the heart, including: A problem with the heart's electrical system, such as a heart block. With a heart block, electrical signals between the chambers of the heart are partially or completely blocked, so they are not able to work as they should. A problem with the heart's natural pacemaker (sinus node). Heart disease. A heart attack. Heart damage. Lyme disease. A heart infection. A heart condition that is present at birth (congenital heart defect). Certain medicines that treat heart conditions. Certain conditions, such as hypothyroidism and obstructive sleep apnea. Problems with the balance of chemicals and other substances, like potassium, in the blood. Trauma. Radiation therapy. What increases the risk? You are more likely to develop this condition if you: Are age 30 or older. Have high blood pressure (hypertension), high cholesterol (hyperlipidemia), or diabetes. Drink heavily, use tobacco or nicotine products, or use drugs. What are the signs or symptoms? Symptoms of this condition include: Light-headedness. Feeling faint or fainting. Fatigue and weakness. Trouble with activity or exercise. Shortness of breath. Chest pain (angina). Drowsiness. Confusion. Dizziness. How is this diagnosed? This condition may be diagnosed based on: Your symptoms. Your medical history. A physical exam. During  the exam, your health care provider will listen to your heartbeat and check your pulse. To confirm the diagnosis, your health care provider may order tests, such as: Blood tests. An electrocardiogram (ECG). This test records the heart's electrical activity. The test can show how fast your heart is beating and whether the heartbeat is steady. A test in which you wear a portable device (event recorder or Holter monitor) to record your heart's electrical activity while you go about your day. An exercise test. How is this treated? Treatment for this condition depends on the cause of the condition and how severe your symptoms are. Treatment may involve: Treatment of the underlying condition. Changing your medicines or how much medicine you take. Having a small, battery-operated device called a pacemaker implanted under the skin. When bradycardia occurs, this device can be used to increase your heart rate and help your heart beat in a regular rhythm. Follow these instructions at home: Lifestyle Manage any health conditions that contribute to bradycardia as told by your health care provider. Follow a heart-healthy diet. A nutrition specialist (dietitian) can help educate you about healthy food options and changes. Follow an exercise program that is approved by your health care provider. Maintain a healthy weight. Try to reduce or manage your stress, such as with yoga or meditation. If you need help reducing stress, ask your health care provider. Do not use any products that contain nicotine or tobacco. These products include cigarettes, chewing tobacco, and vaping devices, such as e-cigarettes. If you need help quitting, ask your health care provider. Do not use illegal drugs. Alcohol  use If you drink alcohol : Limit how much you have to: 0-1 drink a day for women who are not pregnant. 0-2 drinks a day  for men. Know how much alcohol  is in a drink. In the [place] one drink equals one 12 oz bottle of  beer (355 mL), one 5 oz glass of wine (148 mL), or one 1 oz glass of hard liquor (44 mL). General instructions Take over-the-counter and prescription medicines only as told by your health care provider. Keep all follow-up visits. This is important. How is this prevented? In some cases, bradycardia may be prevented by: Treating underlying medical problems. Stopping behaviors or medicines that can trigger the condition. Contact a health care provider if: You feel light-headed or dizzy. You almost faint. You feel weak or are easily fatigued during physical activity. You experience confusion or have memory problems. Get help right away if: You faint. You have chest pains or an irregular heartbeat (palpitations). You have trouble breathing. These symptoms may represent a serious problem that is an emergency. Do not wait to see if the symptoms will go away. Get medical help right away. Call your local emergency services (911 in the [place] Do not drive yourself to the hospital. Summary Bradycardia is a slower-than-normal heartbeat. With bradycardia, the resting heart rate is less than 60 beats per minute. Treatment for this condition depends on the cause. Manage any health conditions that contribute to bradycardia as told by your health care provider. Do not use any products that contain nicotine or tobacco. These products include cigarettes, chewing tobacco, and vaping devices, such as e-cigarettes. Keep all follow-up visits. This is important. This information is not intended to replace advice given to you by your health care provider. Make sure you discuss any questions you have with your health care provider. Document Revised: 11/09/2020 Document Reviewed: 11/09/2020 Elsevier Patient Education  2024 [employer].

## 2024-04-26 NOTE — Progress Notes (Signed)
 Subjective:  Patient ID: Courtney Rice, female    DOB: January 04, 1991  Age: 33 y.o. MRN: [id number]  CC: Medical Management of Chronic Issues (6 month follow up. Samples of migraine medication. )   HPI Courtney Rice presents for f/up -  Discussed the use of AI scribe software for clinical note transcription with the patient, who gave verbal consent to proceed.  History of Present Illness Courtney Rice is a 33 year old female who presents with joint popping and swelling.  She has been experiencing joint popping, particularly in her knees and shoulders, which has progressively worsened over the past year. The popping is sometimes accompanied by pain and swelling, especially in her knees, and the shoulder popping is occasionally audible and louder than usual.  She has not been taking Nurtec due to lack of insurance coverage, resulting in headaches occurring a couple of times a week. She previously used Nurtec for headache management but has been unable to continue due to insurance issues.  She is currently taking Corgard  and her heart rate was measured at 53 bpm. No weakness, dizziness, or lightheadedness despite the low heart rate.  She has a history of stomach issues, attributed to a form of IBS. She recently completed a course of antibiotics for a possible intestinal infection, which she believes has affected her facial skin. She reports constipation as a symptom of her stomach condition.  Her last menstrual cycle was on April 09, 2024. 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 She is currently on Medicaid and awaiting insurance coverage from her new job, which will begin in November.     Outpatient Medications Prior to Visit  Medication Sig Dispense Refill   dicyclomine  (BENTYL ) 10 MG capsule Take 1 capsule (10 mg total) by mouth every 6 (six) hours as needed for spasms. 30 capsule 3    nadolol  (CORGARD ) 20 MG tablet Take 20mg  in the morning and 10mg  in the evening. 135 tablet 3   Atogepant  (QULIPTA ) 60 MG TABS Take 1 tablet (60 mg total) by mouth daily. (Patient not taking: Reported on 04/26/2024) 30 tablet 11   Rimegepant Sulfate (NURTEC) 75 MG TBDP Take 1 tablet (75 mg total) by mouth daily as needed. (Patient not taking: Reported on 04/26/2024) 8 tablet 11   clotrimazole  (GYNE-LOTRIMIN  3) 2 % vaginal cream Place 1 Applicatorful vaginally at bedtime. 21 g 0   DULoxetine  (CYMBALTA ) 30 MG capsule Take 1 capsule (30 mg total) by mouth daily. (Patient not taking: Reported on 04/26/2024) 30 capsule 0   ibuprofen  (ADVIL ) 800 MG tablet Take 1 tablet (800 mg total) by mouth every 8 (eight) hours as needed. (Patient not taking: Reported on 04/26/2024) 30 tablet 5   No facility-administered medications prior to visit.    ROS Review of Systems  Constitutional:  Negative for appetite change, chills, diaphoresis, fatigue and fever.  HENT: Negative.    Eyes: Negative.  Negative for visual disturbance.  Respiratory:  Negative for cough, chest tightness and wheezing.   Cardiovascular:  Negative for chest pain, palpitations and leg swelling.  Gastrointestinal:  Positive for constipation. Negative for abdominal distention, abdominal pain, diarrhea and nausea.  Endocrine: Negative.   Genitourinary: Negative.  Negative for difficulty urinating.  Musculoskeletal:  Positive for arthralgias. Negative for joint swelling and myalgias.  Neurological:  Positive for headaches.  Hematological:  Negative for adenopathy. Does not bruise/bleed easily.  Psychiatric/Behavioral: Negative.  Objective:  BP 124/82 (BP Location: Left Arm, Patient Position: Sitting, Cuff Size: Normal)   Pulse (!) 53   Temp 98.2 F (36.8 C) (Oral)   Resp 16   Ht 5' 4 (1.626 m)   Wt 136 lb (61.7 kg)   LMP 04/09/2024 (Exact Date)   SpO2 99%   BMI 23.34 kg/m   BP Readings from Last 3 Encounters:  04/26/24 124/82   04/02/24 115/79  03/29/24 104/60    Wt Readings from Last 3 Encounters:  04/26/24 136 lb (61.7 kg)  03/29/24 134 lb 6 oz (61 kg)  03/12/24 136 lb (61.7 kg)    Physical Exam Vitals reviewed.  Constitutional:      Appearance: Normal appearance. She is not ill-appearing.  HENT:     Mouth/Throat:     Mouth: Mucous membranes are moist.  Eyes:     General: No scleral icterus.    Conjunctiva/sclera: Conjunctivae normal.  Cardiovascular:     Rate and Rhythm: Regular rhythm. Bradycardia present.     Heart sounds: No murmur heard.    No friction rub. No gallop.     Comments: EKG--- SB, 50 bpm Anterior T wave changes are not new Unchanged  Pulmonary:     Effort: Pulmonary effort is normal.     Breath sounds: No stridor. No wheezing, rhonchi or rales.  Abdominal:     General: Abdomen is flat.     Palpations: There is no mass.     Tenderness: There is no abdominal tenderness. There is no guarding.     Hernia: No hernia is present.  Musculoskeletal:        General: No swelling, tenderness or deformity.     Cervical back: Neck supple.     Right lower leg: No edema.     Left lower leg: No edema.  Lymphadenopathy:     Cervical: No cervical adenopathy.  Skin:    General: Skin is warm and dry.     Findings: No lesion or rash.  Neurological:     General: No focal deficit present.     Mental Status: She is alert.  Psychiatric:        Mood and Affect: Mood normal.        Behavior: Behavior normal.     Lab Results  Component Value Date   WBC 5.2 04/26/2024   HGB 14.0 04/26/2024   HCT 41.3 04/26/2024   PLT 223.0 04/26/2024   GLUCOSE 92 04/26/2024   TRIG 52.0 08/18/2022   ALT 16 08/18/2022   AST 20 08/18/2022   NA 138 04/26/2024   K 4.1 04/26/2024   CL 103 04/26/2024   CREATININE 0.71 04/26/2024   BUN 6 04/26/2024   CO2 29 04/26/2024   TSH 0.82 10/25/2023   HGBA1C 5.6 10/25/2023    No results found.  Assessment & Plan:  Long Q-T syndrome  Chronic migraine  with aura without status migrainosus, not intractable -     Nurtec; Take 1 tablet (75 mg total) by mouth every other day as needed.  Bradycardia- She is asx. -     EKG 12-Lead  Polyarthralgia- ANA is positive. Other labs are normal. -     C-reactive protein; Future -     Basic metabolic panel with GFR; Future -     CBC with Differential/Platelet; Future -     B. burgdorfi antibodies by WB; Future -     Rheumatoid factor; Future -     ANA; Future -  Cyclic citrul peptide antibody, IgG; Future -     Ambulatory referral to Rheumatology  ANA positive -     Ambulatory referral to Rheumatology  Other orders -     Anti-nuclear ab-titer (ANA titer)     Follow-up: Return if symptoms worsen or fail to improve.  Debby Molt, MD

## 2024-04-28 ENCOUNTER — Other Ambulatory Visit: Payer: Self-pay | Admitting: Internal Medicine

## 2024-04-28 DIAGNOSIS — G43709 Chronic migraine without aura, not intractable, without status migrainosus: Secondary | ICD-10-CM

## 2024-04-30 ENCOUNTER — Ambulatory Visit: Payer: Self-pay | Admitting: Internal Medicine

## 2024-04-30 DIAGNOSIS — R768 Other specified abnormal immunological findings in serum: Secondary | ICD-10-CM | POA: Insufficient documentation

## 2024-04-30 LAB — RHEUMATOID FACTOR: Rheumatoid fact SerPl-aCnc: <10 [IU]/mL (ref <14)

## 2024-04-30 LAB — B. BURGDORFI ANTIBODIES BY WB

## 2024-04-30 LAB — CYCLIC CITRUL PEPTIDE ANTIBODY, IGG: Cyclic Citrullin Peptide Ab: <16 U

## 2024-04-30 LAB — ANTI-NUCLEAR AB-TITER (ANA TITER): ANA Titer 1: 1:80 {titer} — ABNORMAL HIGH

## 2024-04-30 LAB — ANA: Anti Nuclear Antibody (ANA): POSITIVE — AB

## 2024-05-07 ENCOUNTER — Telehealth: Payer: Self-pay | Admitting: Internal Medicine

## 2024-05-07 NOTE — Telephone Encounter (Signed)
 STAT if patient feels like he/she is going to faint   1. Are you feeling dizzy, lightheaded, or faint right now? Patient states for the past 3 hours she has been feeling dizzy and lightheaded.     2. Have you passed out?  no (If yes move to .SYNCOPE[facility])   3. Do you have any other symptoms? States she had a headache.  She states the headache is still lightly still there.     4. Have you checked your HR and BP (record if available)? States she doesn't have anything to check it with.

## 2024-05-07 NOTE — Telephone Encounter (Signed)
Unable to reach pt or leave a message  

## 2024-05-08 NOTE — Telephone Encounter (Addendum)
 Patient reports she is feeling better today and no complaints at this time.   Patient reports Saturday 10/4 & Monday 10/6 she felt dizziness/lightheaded and felt like she was going to pass out. Reports feeling fine today. Patient advised if her symptoms present again to go to the ED for evaluation. Pt. Voiced understanding and agreeable.  Presenting rhythm appears to be c/w bradycardia, possible non-conducted P waves, vs long QT.  Requested patient send manual transmission to review an updated presenting rhythm. Patient is at work and will send this afternoon around 5:00 PM.  Dr. Waddell is in office 05/09/24, will need to be reviewed.

## 2024-05-09 ENCOUNTER — Telehealth: Payer: Self-pay | Admitting: Internal Medicine

## 2024-05-09 NOTE — Telephone Encounter (Signed)
 Spoke with patient.  She is increasing salt and fluids and feels like her symptoms are getting worse.  Does not have a blood pressure monitor, recommend she get one or borrow one to help monitor her symptoms.   Patient states she was told yesterday to go to the ER if symptoms continued or got worse, she asks if she should still go.  I explained that Dr. Waddell did not see anything electrically on her report that he would suggest ER.  But, if her symptoms are getting worse and she is concerned then she could go to the ER where they can give her fluids and further testing.   Also offered to set her up with an APP in the next week, but if symptoms get worse she can go to the ER.   Patient agrees with plan and would like appointment, will wait for EP scheduling team to call back today to set up.

## 2024-05-09 NOTE — Telephone Encounter (Signed)
°  1. Has your device fired? no ° °2. Is you device beeping? no ° °3. Are you experiencing draining or swelling at device site?no ° °4. Are you calling to see if we received your device transmission?yes ° °5. Have you passed out? no ° ° ° °Please route to Device Clinic Pool °

## 2024-05-09 NOTE — Telephone Encounter (Signed)
 Reviewed with Dr. Waddell:   Patient needs to increase her salt and fluid intake to help with her symptoms.  Also, recommend she check her blood pressure when she is feeling this way.   Nothing further to do but make above changes and continued monitoring.

## 2024-05-09 NOTE — Telephone Encounter (Signed)
 Unscheduled remote transmission:  PII, presenting SB as low as 44 bpm Patient's follow up transmission. Rhythm appears same as prior. (See message below for details).   Flagging for provider review.  Patient is symptomatic.

## 2024-05-09 NOTE — Telephone Encounter (Signed)
 duplicate

## 2024-05-09 NOTE — Telephone Encounter (Signed)
 Follow up scheduled.  Await further needs.

## 2024-05-13 NOTE — Progress Notes (Deleted)
 Cardiology Office Note:  .   Date:  05/13/2024  ID:  Courtney Rice, DOB April 15, 1991, MRN [id number] PCP: Joshua Debby CROME, MD  [facility] Providers Cardiologist:  None Electrophysiologist:  Danelle Birmingham, MD {  History of Present Illness: .   Courtney Rice is a 33 y.o. female w/PMHx of  Congenital Long QT (mpm and grandmother w/ICDs and have had appropriate therapies) PMVT/torsades during pregnancy associated w/syncope > S-ICD    She last saw Dr. Birmingham 11/18/23, adusted her dose to 20mg  in the AM and 10mg  in the evenings  05/07/24, pt reached out c/o dizzy spells Took a couple days to reach her, reported a couple days of feeling lightheaded dizzy Device transmission w/SB 40's Dr. Birmingham reviewed, no med changes Advised adequate hydration and salt intake  Today's visit is scheduled to evaluate bradycardia and dizziness ROS:   *** EKG/QT *** medication compliance? *** symptoms, syncope... *** preg *** monitor for brady *** credible meds/teaching  Device information BSCi S-ICD implanted 12/20/2013, gen change 10/31/20 Secondary prevention device Not on any current advisories  Arrhythmia/AAD hx  PMVT/VF Nov 2024 + appropriate tx  Was off her BB (never started it when prescribed) Urged compliance with medications Rx nadolol    Studies Reviewed: SABRA    EKG done today and reviewed by myself:  ***  DEVICE interrogation done today and reviewed by myself *** Battery and lead measurements are good ***  08/02/2015: TTE Left ventricle: The cavity size was normal. Systolic function was    normal. The estimated ejection fraction was in the range of 55%    to 60%. Wall motion was normal; there were no regional wall    motion abnormalities. Left ventricular diastolic function    parameters were normal.  - Aortic valve: Trileaflet; normal thickness leaflets. There was no    regurgitation.  - Aortic root: The aortic root was normal in size.  - Mitral valve:  Structurally normal valve.  - Left atrium: The atrium was normal in size.  - Right ventricle: The cavity size was mildly dilated. Wall    thickness was normal. Pacer wire or catheter noted in right    ventricle. Systolic function was normal.  - Right atrium: The atrium was normal in size.  - Tricuspid valve: Structurally normal valve. There was mild    regurgitation.  - Pulmonary arteries: Systolic pressure was within the normal    range.  - Inferior vena cava: The vessel was normal in size.  - Pericardium, extracardiac: There was no pericardial effusion.    Risk Assessment/Calculations:    Physical Exam:   VS:  LMP 04/09/2024 (Exact Date)    Wt Readings from Last 3 Encounters:  04/26/24 136 lb (61.7 kg)  03/29/24 134 lb 6 oz (61 kg)  03/12/24 136 lb (61.7 kg)    GEN: Well nourished, well developed in no acute distress NECK: No JVD; No carotid bruits CARDIAC: ***RRR, no murmurs, rubs, gallops RESPIRATORY:  *** CTA b/l without rales, wheezing or rhonchi  ABDOMEN: Soft, non-tender, non-distended EXTREMITIES: *** No edema; No deformity   ICD site: *** is stable, no thinning, fluctuation, tethering  ASSESSMENT AND PLAN: .    S-ICD *** intact function *** no programming changes made  Long QT syndrome ***  dizziness ***     {Are you ordering a CV Procedure (e.g. stress test, cath, DCCV, TEE, etc)?   Press F2        :[id number]}     Dispo: ***  Signed, Charlies Macario Arthur, PA-C

## 2024-05-15 ENCOUNTER — Ambulatory Visit: Admitting: Physician Assistant

## 2024-05-17 ENCOUNTER — Ambulatory Visit: Payer: Self-pay | Admitting: Internal Medicine

## 2024-07-13 ENCOUNTER — Encounter: Payer: Self-pay | Admitting: Neurology

## 2024-07-30 ENCOUNTER — Encounter: Payer: Self-pay | Admitting: Neurology

## 2024-07-31 ENCOUNTER — Other Ambulatory Visit (HOSPITAL_COMMUNITY): Payer: Self-pay

## 2024-07-31 ENCOUNTER — Telehealth: Payer: Self-pay | Admitting: Pharmacy Technician

## 2024-07-31 NOTE — Telephone Encounter (Signed)
 Pharmacy Patient Advocate Encounter   Received notification from Patient Advice Request messages that prior authorization for NURTEC 75MG  is required/requested.   Insurance verification completed.   The patient is insured through [employer].   Per test claim: PA required; PA submitted to above mentioned insurance via Latent Key/confirmation #/EOC [id number] Status is pending

## 2024-07-31 NOTE — Telephone Encounter (Signed)
 PA has been submitted, and telephone encounter has been created. Please see telephone encounter dated 12.30.25.

## 2024-08-20 ENCOUNTER — Encounter: Payer: Self-pay | Admitting: Internal Medicine

## 2024-08-21 NOTE — Progress Notes (Unsigned)
 "  Office Visit Note  Patient: Courtney Rice             Date of Birth: May 25, 1991           MRN: [id number]             PCP: Joshua Debby CROME, MD Referring: Joshua Debby CROME, MD Visit Date: 09/04/2024 Occupation: Data Unavailable  Subjective:  No chief complaint on file.   History of Present Illness: Courtney Rice is a 34 y.o. female ***     Activities of Daily Living:  Patient reports morning stiffness for *** {minute/hour:19697}.   Patient {ACTIONS;DENIES/REPORTS:21021675::Denies} nocturnal pain.  Difficulty dressing/grooming: {ACTIONS;DENIES/REPORTS:21021675::Denies} Difficulty climbing stairs: {ACTIONS;DENIES/REPORTS:21021675::Denies} Difficulty getting out of chair: {ACTIONS;DENIES/REPORTS:21021675::Denies} Difficulty using hands for taps, buttons, cutlery, and/or writing: {ACTIONS;DENIES/REPORTS:21021675::Denies}  No Rheumatology ROS completed.   PMFS History:  Patient Active Problem List   Diagnosis Date Noted   ANA positive 04/30/2024   Bradycardia 04/26/2024   Polyarthralgia 04/26/2024   Mild episode of recurrent major depressive disorder 01/18/2024   Chronic migraine with aura without status migrainosus, not intractable 10/25/2023   Encounter for general adult medical examination with abnormal findings 10/25/2023   Immunization due 10/25/2023   Vitamin D  deficiency 10/25/2023   Constipation 06/15/2023   Patellar tendonitis of both knees 08/18/2022   Current moderate episode of major depressive disorder without prior episode (HCC) 10/08/2021   ICD (implantable cardioverter-defibrillator) in place 02/16/2021   HGSIL (high grade squamous intraepithelial lesion) on Pap smear of cervix 09/06/2019   LGSIL on Pap smear of cervix 07/12/2018   Polymorphic ventricular tachycardia (HCC) 11/30/2013   Long Q-T syndrome 06/06/2013    Past Medical History:  Diagnosis Date   AICD (automatic cardioverter/defibrillator) present    Long Q-T syndrome Type 2     a. post partum syncope and VT PM;  b. 11/2013 s/p BSX SubQ ICD placement.   Migraine    Pre-syncope admitted 08/01/2015   a. felt to be 2/2 orthostatic hypotension - propranolol  decreased.     Family History  Problem Relation Age of Onset   Heart disease Mother    Diabetes Mother    Asthma Mother    COPD Mother    Lupus Mother    Stroke Mother    Hypertension Mother    Colon polyps Mother    Fibroids Mother    Kidney disease Mother    Fibromyalgia Mother    Sarcoidosis Mother    Cervical cancer Mother    Irritable bowel syndrome Mother    Liver disease Mother    Cystic fibrosis Mother    COPD Maternal Grandmother    Heart disease Maternal Grandmother    Breast cancer Maternal Aunt    Long QT syndrome Son    Hearing loss Neg Hx    Heart attack Neg Hx    Colon cancer Neg Hx    Stomach cancer Neg Hx    Past Surgical History:  Procedure Laterality Date   IMPLANTABLE CARDIOVERTER DEFIBRILLATOR IMPLANT  12/20/2013   BSX SubQ ICD implanted by Dr Fernande   IMPLANTABLE CARDIOVERTER DEFIBRILLATOR IMPLANT N/A 12/20/2013   Procedure: SUB Q ICD;  Surgeon: Elspeth JAYSON Fernande, MD;  Location: [facility] CATH LAB;  Service: Cardiovascular;  Laterality: N/A;   polyp removal  2024   SUBQ ICD CHANGEOUT N/A 10/31/2020   Procedure: SUBQ ICD CHANGEOUT;  Surgeon: Fernande Elspeth JAYSON, MD;  Location: [facility] INVASIVE CV LAB;  Service: Cardiovascular;  Laterality: N/A;   TONSILLECTOMY AND ADENOIDECTOMY  08/02/2009   Social History[1] Social History   Social History Narrative   Right handed   No ETOH or smoking   One story home     Immunization History  Administered Date(s) Administered   Tdap 10/26/2013, 10/25/2023     Objective: Vital Signs: There were no vitals taken for this visit.   Physical Exam   Musculoskeletal Exam: ***  CDAI Exam: CDAI Score: -- Patient Global: --; Provider Global: -- Swollen: --; Tender: -- Joint Exam 09/04/2024   No joint exam has been documented for this visit    There is currently no information documented on the homunculus. Go to the Rheumatology activity and complete the homunculus joint exam.  Investigation: No additional findings.  Imaging: No results found.  Recent Labs: Lab Results  Component Value Date   WBC 5.2 04/26/2024   HGB 14.0 04/26/2024   PLT 223.0 04/26/2024   NA 138 04/26/2024   K 4.1 04/26/2024   CL 103 04/26/2024   CO2 29 04/26/2024   GLUCOSE 92 04/26/2024   BUN 6 04/26/2024   CREATININE 0.71 04/26/2024   BILITOT 0.4 08/18/2022   ALKPHOS 46 08/18/2022   AST 20 08/18/2022   ALT 16 08/18/2022   PROT 7.4 08/18/2022   ALBUMIN 4.5 08/18/2022   CALCIUM 10.0 04/26/2024   GFRAA 137 05/26/2017    Speciality Comments: No specialty comments available.  Procedures:  No procedures performed Allergies: Patient has no known allergies.   Assessment / Plan:     Visit Diagnoses: No diagnosis found.  Orders: No orders of the defined types were placed in this encounter.  No orders of the defined types were placed in this encounter.   Face-to-face time spent with patient was *** minutes. Greater than 50% of time was spent in counseling and coordination of care.  Follow-Up Instructions: No follow-ups on file.   Alfonso Patterson, LPN  Note - This record has been created using [employer].  Chart creation errors have been sought, but may not always  have been located. Such creation errors do not reflect on  the standard of medical care.    [1]  Social History Tobacco Use   Smoking status: Never   Smokeless tobacco: Never  Vaping Use   Vaping status: Never Used  Substance Use Topics   Alcohol use: No   Drug use: No   "

## 2024-08-22 ENCOUNTER — Other Ambulatory Visit: Payer: Self-pay | Admitting: Internal Medicine

## 2024-08-22 DIAGNOSIS — L509 Urticaria, unspecified: Secondary | ICD-10-CM

## 2024-08-22 MED ORDER — DOXEPIN HCL 10 MG PO CAPS: 10.0000 mg | ORAL_CAPSULE | Freq: Every day | ORAL | 0 refills | Status: AC

## 2024-09-04 ENCOUNTER — Ambulatory Visit

## 2024-09-06 ENCOUNTER — Ambulatory Visit

## 2024-09-06 VITALS — BP 104/67 | HR 62 | Temp 97.8°F | Resp 12 | Ht 64.5 in | Wt 134.6 lb

## 2024-09-06 DIAGNOSIS — L509 Urticaria, unspecified: Secondary | ICD-10-CM

## 2024-09-06 DIAGNOSIS — R7689 Other specified abnormal immunological findings in serum: Secondary | ICD-10-CM

## 2024-09-06 DIAGNOSIS — E559 Vitamin D deficiency, unspecified: Secondary | ICD-10-CM

## 2024-09-06 DIAGNOSIS — R21 Rash and other nonspecific skin eruption: Secondary | ICD-10-CM

## 2024-09-06 DIAGNOSIS — M357 Hypermobility syndrome: Secondary | ICD-10-CM

## 2024-09-06 DIAGNOSIS — M2550 Pain in unspecified joint: Secondary | ICD-10-CM

## 2024-09-06 NOTE — Progress Notes (Unsigned)
 "  Office Visit Note  Patient: Courtney Rice             Date of Birth: August 14, 1990           MRN: [id number]             PCP: Joshua Debby CROME, MD Referring: Joshua Debby CROME, MD Visit Date: 09/06/2024 Occupation: Data Unavailable  Subjective:  New Patient (Initial Visit) (Abnormal Labs)   History of Present Illness: Courtney Rice is a 34 y.o. female   Discussed the use of AI scribe software for clinical note transcription with the patient, who gave verbal consent to proceed.  History of Present Illness Courtney Rice is a 34 year old female who presents with joint pain and rashes. She was referred by Dr. Joshua for evaluation of possible lupus.  She has been experiencing joint pain and popping sounds, particularly in her knees and shoulders, which have been increasing over the past year. She previously attended physical therapy for her knees about two years ago, which helped reduce swelling and pain, but she had to stop due to gastrointestinal issues. Her knees are described as misaligned, and she reports pain with activity, especially after walking or lifting, and improvement after stretching or physical therapy.  She experiences rashes that are itchy and appear in the same spots, primarily on her thighs, stomach, and occasionally her arms. These rashes do not leave scars and tend to clear up and return. Initially thought to be stress-related, they appeared more frequently during stressful periods and at her previous job. Her mother, who has lupus, noted that the rashes resemble those she experiences. The rashes have been present for about two weeks at a time.  Her family history is significant for lupus and sarcoidosis in her mother. She has a history of gastrointestinal issues, previously diagnosed as IBS, and underwent a colonoscopy which did not reveal a definitive cause. She was treated with antibiotics for a suspected intestinal infection, which improved her symptoms.  No  dry eyes, dry mouth, or sores in her mouth. She does not experience any unusual rashes in the sun or changes in her hands' color in the cold. She reports no stiffness in the morning, and her joint pain does not wake her from sleep. She denies any joint swelling.  She has experienced significant stress over the past year due to personal and family health issues.    Activities of Daily Living:  Patient reports morning stiffness for 0 minutes.   Patient Denies nocturnal pain.  Difficulty dressing/grooming: Denies Difficulty climbing stairs: Denies Difficulty getting out of chair: Denies Difficulty using hands for taps, buttons, cutlery, and/or writing: Denies  Review of Systems  Constitutional:  Positive for fatigue.  HENT:  Negative for mouth sores and mouth dryness.   Eyes:  Negative for dryness.  Respiratory:  Negative for shortness of breath.   Cardiovascular:  Negative for chest pain and palpitations.  Gastrointestinal:  Negative for blood in stool, constipation and diarrhea.  Endocrine: Negative for increased urination.  Genitourinary:  Negative for involuntary urination.  Musculoskeletal:  Positive for joint pain, joint pain and joint swelling. Negative for gait problem, myalgias, muscle weakness, morning stiffness, muscle tenderness and myalgias.  Skin:  Positive for rash. Negative for color change, hair loss and sensitivity to sunlight.  Allergic/Immunologic: Negative for susceptible to infections.  Neurological:  Negative for dizziness and headaches.  Hematological:  Negative for swollen glands.  Psychiatric/Behavioral:  Negative for depressed mood and sleep  disturbance. The patient is not nervous/anxious.     PMFS History:  Patient Active Problem List   Diagnosis Date Noted   ANA positive 04/30/2024   Bradycardia 04/26/2024   Polyarthralgia 04/26/2024   Mild episode of recurrent major depressive disorder 01/18/2024   Chronic migraine with aura without status  migrainosus, not intractable 10/25/2023   Encounter for general adult medical examination with abnormal findings 10/25/2023   Immunization due 10/25/2023   Vitamin D  deficiency 10/25/2023   Constipation 06/15/2023   Patellar tendonitis of both knees 08/18/2022   Current moderate episode of major depressive disorder without prior episode (HCC) 10/08/2021   ICD (implantable cardioverter-defibrillator) in place 02/16/2021   HGSIL (high grade squamous intraepithelial lesion) on Pap smear of cervix 09/06/2019   LGSIL on Pap smear of cervix 07/12/2018   Polymorphic ventricular tachycardia (HCC) 11/30/2013   Long Q-T syndrome 06/06/2013    Past Medical History:  Diagnosis Date   AICD (automatic cardioverter/defibrillator) present    Long Q-T syndrome Type 2    a. post partum syncope and VT PM;  b. 11/2013 s/p BSX SubQ ICD placement.   Migraine    Pre-syncope admitted 08/01/2015   a. felt to be 2/2 orthostatic hypotension - propranolol  decreased.     Family History  Problem Relation Age of Onset   Long QT syndrome Mother    Heart disease Mother    Diabetes Mother    Asthma Mother    COPD Mother    Lupus Mother    Stroke Mother    Hypertension Mother    Colon polyps Mother    Fibroids Mother    Kidney disease Mother    Fibromyalgia Mother    Sarcoidosis Mother    Cervical cancer Mother    Irritable bowel syndrome Mother    Liver disease Mother    Cystic fibrosis Mother    Long QT syndrome Maternal Grandmother    COPD Maternal Grandmother    Heart disease Maternal Grandmother    Long QT syndrome Son    Breast cancer Maternal Aunt    Hearing loss Neg Hx    Heart attack Neg Hx    Colon cancer Neg Hx    Stomach cancer Neg Hx    Past Surgical History:  Procedure Laterality Date   IMPLANTABLE CARDIOVERTER DEFIBRILLATOR IMPLANT  12/20/2013   BSX SubQ ICD implanted by Dr Fernande   IMPLANTABLE CARDIOVERTER DEFIBRILLATOR IMPLANT N/A  12/20/2013   Procedure: SUB Q ICD;  Surgeon: Elspeth JAYSON Fernande, MD;  Location: [facility] CATH LAB;  Service: Cardiovascular;  Laterality: N/A;   polyp removal  2024   SUBQ ICD CHANGEOUT N/A 10/31/2020   Procedure: SUBQ ICD CHANGEOUT;  Surgeon: Fernande Elspeth JAYSON, MD;  Location: [facility] INVASIVE CV LAB;  Service: Cardiovascular;  Laterality: N/A;   TONSILLECTOMY AND ADENOIDECTOMY  08/02/2009   Social History[1] Social History   Social History Narrative   Right handed   No ETOH or smoking   One story home     Immunization History  Administered Date(s) Administered   Tdap 10/26/2013, 10/25/2023     Objective: Vital Signs: BP 104/67 (BP Location: Right Arm, Patient Position: Sitting, Cuff Size: Small)   Pulse 62   Temp 97.8 F (36.6 C)   Resp 12   Ht 5' 4.5 (1.638 m)   Wt 134 lb 9.6 oz (61.1 kg)   LMP 09/03/2024   BMI 22.75 kg/m    Physical Exam   Musculoskeletal Exam: Beighton score for joint hypermobility:7 Bilateral  passive apposition of the thumb to the volar aspect of the ipsilateral forearm  Bilateral passive hyperextension of fingers, demonstrated by passive dorsiflexion of the fifth metacarpophalangeal joint to at least 90 degrees Hyperextension of the bilateral knees to at least 10 degrees  Flexion of the spine with placement of the palms flat on the floor without bending the knees   CDAI Exam: CDAI Score: -- Patient Global: --; Provider Global: -- Swollen: 0 ; Tender: 0  Joint Exam 09/06/2024   All documented joints were normal     Investigation: No additional findings.  Imaging: No results found.  Recent Labs: Lab Results  Component Value Date   WBC 5.2 04/26/2024   HGB 14.0 04/26/2024   PLT 223.0 04/26/2024   NA 138 04/26/2024   K 4.1 04/26/2024   CL 103 04/26/2024   CO2 29 04/26/2024   GLUCOSE 92 04/26/2024   BUN 6 04/26/2024   CREATININE 0.71 04/26/2024   BILITOT 0.4 08/18/2022   ALKPHOS 46 08/18/2022   AST 20 08/18/2022   ALT 16 08/18/2022    PROT 7.4 08/18/2022   ALBUMIN 4.5 08/18/2022   CALCIUM 10.0 04/26/2024   GFRAA 137 05/26/2017    Speciality Comments: No specialty comments available.  Procedures:  No procedures performed Allergies: Patient has no known allergies.   Assessment / Plan:     Visit Diagnoses:   Positive ANA (antinuclear antibody)  Patient with very low titer ANA (1:80) and non-inflammatory joint pain. Given abnormal rashes, will obtain Complement component c1q, C1 esterase inhibitor, functional, Anti-DNA antibody, double-stranded, Anti-Smith antibody, C3 and C4, Sjogren's syndrome antibods(ssa + ssb), RNP Antibody, Protein / creatinine ratio, urine today. However, patient does not have any features of SLE (no objective malar rash, inflammatory arthritis, Raynaud's, alopecia, recurrent cytopenias), Sjogren's disease (no sicca symptoms), or scleroderma (no sclerodactyly, no Raynaud's).  Discussed with patient that a positive ANA need not represent presence of clinically active systemic autoimmune disease.  Can be positive in the normal population, autoimmune thyroid  disease (Graves' disease, Hashimoto's thyroiditis etc.), or infection. ANA can be positive in the healthy population at the following rates: ANA 1:40: 20%-30%, ANA 1:80: 10%-15%, ANA 1:160: 5%, ANA 1:320: 3% positive, healthy relative of an SLE patient: 5%-25% positive (usually low titers), and elderly (age >70 years): up to 70% positive at ANA titer 1:40. Given her COVID infection a few weeks prior to the positive test, and her family hx of autoimmune disease, she has multiple alternative factors that would explain her mildly positive result.   Rash and other nonspecific skin eruption - Plan: Angiotensin converting enzyme, Vitamin D  1,25 dihydroxy  Generalized hypermobility of joints Patient with hx of joint pain that appears non-inflammatory in nature (no prolonged AM stiffness, worse with activity) and evidence of hypermobility on exam (beighton  score 7). At this time, suspect patient's joint symptoms are secondary to generalized hypermobility syndrome.  Discussed joint hypermobility is not an autoimmune disease, and that we do not have any medications to treat the condition. Discussed supportive care including physical therapy for joint strengthening/stabilization, with referral for physical therapy placed at this encounter.  Orders: Orders Placed This Encounter  Procedures   Complement component c1q   C1 esterase inhibitor, functional   Anti-DNA antibody, double-stranded   Anti-Smith antibody   C3 and C4   Sjogren's syndrome antibods(ssa + ssb)   RNP Antibody   Protein / creatinine ratio, urine   Angiotensin converting enzyme   Vitamin D  1,25 dihydroxy   No orders  of the defined types were placed in this encounter.  I personally spent a total of 60 minutes in the care of the patient today including preparing to see the patient, getting/reviewing separately obtained history, performing a medically appropriate exam/evaluation, counseling and educating, referring and communicating with other health care professionals, documenting clinical information in the EHR, and independently interpreting results.   Follow-Up Instructions: Return in about 4 weeks (around 10/04/2024).   Asberry Claw, DO  Note - This record has been created using Animal nutritionist.  Chart creation errors have been sought, but may not always  have been located. Such creation errors do not reflect on  the standard of medical care.         [1] Social History Tobacco Use   Smoking status: Never    Passive exposure: Current   Smokeless tobacco: Never  Vaping Use   Vaping status: Never Used  Substance Use Topics   Alcohol use: No   Drug use: No  "

## 2024-09-07 LAB — PROTEIN / CREATININE RATIO, URINE
Creatinine, Urine: 28 mg/dL (ref 20–275)
Protein/Creat Ratio: 1107 mg/g{creat} — ABNORMAL HIGH (ref 24–184)
Protein/Creatinine Ratio: 1.107 mg/mg{creat} — ABNORMAL HIGH (ref 0.024–0.184)
Total Protein, Urine: 31 mg/dL — ABNORMAL HIGH (ref 5–24)

## 2024-09-07 LAB — ANGIOTENSIN CONVERTING ENZYME: Angiotensin-Converting Enzyme: 31 U/L (ref 9–67)

## 2024-09-07 LAB — C3 AND C4
C3 Complement: 127 mg/dL (ref 83–193)
C4 Complement: 27 mg/dL (ref 15–57)

## 2024-10-04 ENCOUNTER — Ambulatory Visit

## 2024-10-08 ENCOUNTER — Ambulatory Visit

## 2024-11-12 ENCOUNTER — Ambulatory Visit: Admitting: Neurology

## 2025-01-07 ENCOUNTER — Ambulatory Visit
